# Patient Record
Sex: Male | Born: 1975
Health system: Southern US, Community
[De-identification: ages and names within clinical notes are randomized; demographics above are authoritative.]

## PROBLEM LIST (undated history)

## (undated) DIAGNOSIS — H9191 Unspecified hearing loss, right ear: Secondary | ICD-10-CM

## (undated) DIAGNOSIS — G4733 Obstructive sleep apnea (adult) (pediatric): Secondary | ICD-10-CM

## (undated) DIAGNOSIS — Z9989 Dependence on other enabling machines and devices: Secondary | ICD-10-CM

## (undated) DIAGNOSIS — R519 Headache, unspecified: Secondary | ICD-10-CM

## (undated) DIAGNOSIS — R51 Headache: Secondary | ICD-10-CM

## (undated) DIAGNOSIS — Z973 Presence of spectacles and contact lenses: Secondary | ICD-10-CM

## (undated) DIAGNOSIS — S86012A Strain of left Achilles tendon, initial encounter: Secondary | ICD-10-CM

## (undated) DIAGNOSIS — J45909 Unspecified asthma, uncomplicated: Secondary | ICD-10-CM

## (undated) HISTORY — DX: Headache, unspecified: R51.9

## (undated) HISTORY — PX: CARDIOVASCULAR STRESS TEST: SHX262

## (undated) HISTORY — DX: Headache: R51

## (undated) HISTORY — PX: TONSILLECTOMY: SUR1361

---

## 2006-01-07 ENCOUNTER — Emergency Department (HOSPITAL_COMMUNITY): Admission: EM | Admit: 2006-01-07 | Discharge: 2006-01-08 | Payer: Self-pay | Admitting: Emergency Medicine

## 2009-11-22 ENCOUNTER — Encounter: Admission: RE | Admit: 2009-11-22 | Discharge: 2009-11-22 | Payer: Self-pay | Admitting: Internal Medicine

## 2011-04-22 ENCOUNTER — Emergency Department (HOSPITAL_COMMUNITY)
Admission: EM | Admit: 2011-04-22 | Discharge: 2011-04-22 | Disposition: A | Discharge status: Home / Self Care | Payer: BC Managed Care – PPO | Attending: Emergency Medicine | Admitting: Emergency Medicine

## 2011-04-22 ENCOUNTER — Emergency Department (HOSPITAL_COMMUNITY): Payer: BC Managed Care – PPO

## 2011-04-22 ENCOUNTER — Encounter: Payer: Self-pay | Admitting: *Deleted

## 2011-04-22 ENCOUNTER — Other Ambulatory Visit: Payer: Self-pay

## 2011-04-22 DIAGNOSIS — J45909 Unspecified asthma, uncomplicated: Secondary | ICD-10-CM | POA: Insufficient documentation

## 2011-04-22 DIAGNOSIS — R0602 Shortness of breath: Secondary | ICD-10-CM | POA: Insufficient documentation

## 2011-04-22 DIAGNOSIS — F419 Anxiety disorder, unspecified: Secondary | ICD-10-CM

## 2011-04-22 DIAGNOSIS — F411 Generalized anxiety disorder: Secondary | ICD-10-CM | POA: Insufficient documentation

## 2011-04-22 DIAGNOSIS — R079 Chest pain, unspecified: Secondary | ICD-10-CM | POA: Insufficient documentation

## 2011-04-22 DIAGNOSIS — R42 Dizziness and giddiness: Secondary | ICD-10-CM | POA: Insufficient documentation

## 2011-04-22 DIAGNOSIS — R002 Palpitations: Secondary | ICD-10-CM | POA: Insufficient documentation

## 2011-04-22 LAB — POCT I-STAT TROPONIN I

## 2011-04-22 LAB — POCT I-STAT, CHEM 8
BUN: 17 mg/dL (ref 6–23)
Chloride: 103 mEq/L (ref 96–112)
Hemoglobin: 14.3 g/dL (ref 13.0–17.0)
Sodium: 142 mEq/L (ref 135–145)

## 2011-04-22 MED ORDER — ASPIRIN 81 MG PO CHEW
324.0000 mg | CHEWABLE_TABLET | Freq: Once | ORAL | Status: AC
  Administered 2011-04-22: 324 mg via ORAL
  Filled 2011-04-22: qty 4

## 2011-04-22 MED ORDER — LORAZEPAM 1 MG PO TABS
1.0000 mg | ORAL_TABLET | Freq: Once | ORAL | Status: AC
  Administered 2011-04-22: 1 mg via ORAL
  Filled 2011-04-22: qty 1

## 2011-04-22 MED ORDER — LORAZEPAM 1 MG PO TABS: 1.0000 mg | ORAL_TABLET | Freq: Three times a day (TID) | ORAL | Status: AC | PRN

## 2011-04-22 NOTE — ED Notes (Signed)
Chest pain that started this am, central chest and then sharp pains to right side of chest. reprots feeling dizzy and lightheaded. ekg done at triage.

## 2011-04-22 NOTE — ED Provider Notes (Signed)
History     CSN: [id number]  Arrival date & time 04/22/11  1509   First MD Initiated Contact with Patient 04/22/11 1557      Chief Complaint  Patient presents with  . Chest Pain  . Shortness of Breath  . Dizziness    (Consider location/radiation/quality/duration/timing/severity/associated sxs/prior treatment) HPI  Chest pain that started this am, central chest and then sharp pains to right side of chest. reprots feeling dizzy and lightheaded. ekg done at triage.  Patient does have recent stressors from the fact that his grandfather died yesterday.  He states that his pain this become do not last greater than 60 seconds single way abruptly.  He also states he had an episode of palpitations early morning and maybe lasted a minute or 2 and then went away .  Has not returned since then.  Past Medical History  Diagnosis Date  . Asthma     History reviewed. No pertinent past surgical history.  History reviewed. No pertinent family history.  History  Substance Use Topics  . Smoking status: Former Games developer  . Smokeless tobacco: Not on file  . Alcohol Use: No      Review of Systems  All other systems reviewed and are negative.    Allergies  Review of patient's allergies indicates no known allergies.  Home Medications  No current outpatient prescriptions on file.  BP 124/81  Pulse 81  Temp(Src) 97.1 F (36.2 C) (Oral)  Resp 20  SpO2 97%  Physical Exam  Nursing note and vitals reviewed. Constitutional: He is oriented to person, place, and time. He appears well-developed and well-nourished. No distress.  HENT:  Head: Normocephalic and atraumatic.  Eyes: Pupils are equal, round, and reactive to light.  Neck: Normal range of motion.  Cardiovascular: Normal rate and intact distal pulses.          Date: 04/22/2011  Rate: 79  Rhythm: normal sinus rhythm  QRS Axis: normal  Intervals: normal  ST/T Wave abnormalities: normal  Conduction Disutrbances:none:   Old EKG  Reviewed: none available     Pulmonary/Chest: No respiratory distress. He has no wheezes. He has no rales.  Abdominal: Normal appearance. He exhibits no distension.  Musculoskeletal: Normal range of motion.  Neurological: He is alert and oriented to person, place, and time. No cranial nerve deficit.  Skin: Skin is warm and dry. No rash noted.  Psychiatric: He has a normal mood and affect. His behavior is normal.    ED Course  Procedures (including critical care time)  Labs Reviewed  POCT I-STAT, CHEM 8 - Abnormal; Notable for the following:    Glucose, Bld 103 (*)    All other components within normal limits  POCT I-STAT TROPONIN I  I-STAT TROPONIN I  I-STAT, CHEM 8   Dg Chest 2 View  04/22/2011  *RADIOLOGY REPORT*  Clinical Data: Chest pain and shortness of breath.  CHEST - 2 VIEW  Comparison: 01/07/2006.  Findings: Normal sized heart.  Clear lungs.  Mild diffuse peribronchial thickening, unchanged.  Thoracic spine degenerative changes.  IMPRESSION: Stable mild bronchitic changes.  Original Report Authenticated By: Darrol Angel, M.D.     1. Anxiety       MDM   Patient has low risk for ACS.  Plan at this time is to start on daily aspirin.  Prescribed a benzodiazepine for anxiety and have him arrange close followup with his PMD.  Chest x-ray indicates he may have some bronchitic/asthmatic changes.  This all may be  pulmonary related.       Nelia Shi, MD 04/22/11 1705

## 2011-04-24 ENCOUNTER — Ambulatory Visit (INDEPENDENT_AMBULATORY_CARE_PROVIDER_SITE_OTHER): Payer: BC Managed Care – PPO

## 2011-04-24 DIAGNOSIS — R0789 Other chest pain: Secondary | ICD-10-CM

## 2011-04-25 ENCOUNTER — Observation Stay (HOSPITAL_COMMUNITY)
Admission: EM | Admit: 2011-04-25 | Discharge: 2011-04-26 | Disposition: A | Discharge status: Home / Self Care | Payer: BC Managed Care – PPO | Attending: Emergency Medicine | Admitting: Emergency Medicine

## 2011-04-25 ENCOUNTER — Emergency Department (HOSPITAL_COMMUNITY): Payer: BC Managed Care – PPO

## 2011-04-25 ENCOUNTER — Encounter (HOSPITAL_COMMUNITY): Payer: Self-pay | Admitting: Emergency Medicine

## 2011-04-25 DIAGNOSIS — R079 Chest pain, unspecified: Principal | ICD-10-CM | POA: Insufficient documentation

## 2011-04-25 DIAGNOSIS — R0602 Shortness of breath: Secondary | ICD-10-CM | POA: Insufficient documentation

## 2011-04-25 DIAGNOSIS — R5381 Other malaise: Secondary | ICD-10-CM | POA: Insufficient documentation

## 2011-04-25 DIAGNOSIS — R5383 Other fatigue: Secondary | ICD-10-CM | POA: Insufficient documentation

## 2011-04-25 DIAGNOSIS — I2510 Atherosclerotic heart disease of native coronary artery without angina pectoris: Secondary | ICD-10-CM

## 2011-04-25 DIAGNOSIS — R42 Dizziness and giddiness: Secondary | ICD-10-CM | POA: Insufficient documentation

## 2011-04-25 DIAGNOSIS — R0789 Other chest pain: Secondary | ICD-10-CM

## 2011-04-25 LAB — DIFFERENTIAL
Basophils Absolute: 0 10*3/uL (ref 0.0–0.1)
Eosinophils Relative: 3 % (ref 0–5)
Lymphs Abs: 2.4 10*3/uL (ref 0.7–4.0)
Monocytes Absolute: 0.6 10*3/uL (ref 0.1–1.0)
Neutro Abs: 4.5 10*3/uL (ref 1.7–7.7)
Neutrophils Relative %: 58 % (ref 43–77)

## 2011-04-25 LAB — CBC
Hemoglobin: 14.1 g/dL (ref 13.0–17.0)
RBC: 4.63 MIL/uL (ref 4.22–5.81)

## 2011-04-25 LAB — POCT I-STAT, CHEM 8
BUN: 16 mg/dL (ref 6–23)
Calcium, Ion: 1.31 mmol/L (ref 1.12–1.32)
Creatinine, Ser: 1.2 mg/dL (ref 0.50–1.35)
Glucose, Bld: 89 mg/dL (ref 70–99)
Hemoglobin: 14.3 g/dL (ref 13.0–17.0)

## 2011-04-25 MED ORDER — ASPIRIN 81 MG PO CHEW
81.0000 mg | CHEWABLE_TABLET | Freq: Once | ORAL | Status: AC
  Administered 2011-04-25: 81 mg via ORAL
  Filled 2011-04-25: qty 1

## 2011-04-25 MED ORDER — SODIUM CHLORIDE 0.9 % IV SOLN
Freq: Once | INTRAVENOUS | Status: AC
  Administered 2011-04-25: 22:00:00 via INTRAVENOUS

## 2011-04-25 NOTE — ED Notes (Signed)
Pt c/o central diffuse CP, had some RUQ pain on Sunday, describes as intermittant pressure, also some sob & dizziness, mentions nasal congestion. (denies: radiation, swelling, fever, nvd, cough, congestion, cold sx, palpitaitons, fluttering, numbness, tingling, diaphoresis  Or other sx). Seen recently for the same, given ativan for anxiety, reports no relief, thought it might be his asthma, reports no relief with albuterol). Calm, NAD, resps e/u, LS CTA.  Mentions cold sx in November & lifting heavier than usual 2 weeks ago at work.

## 2011-04-25 NOTE — ED Notes (Signed)
Pt st's he was seen here on Sun for chest pain and was dx with anxiety.  St's has continued to have the pain.  Was seen at a Urgent Care last pm and was referred to a Cardiologist.  Had neg exam last pm.  Pt st's pain started again today after work.

## 2011-04-25 NOTE — ED Provider Notes (Signed)
History     CSN: [id number]  Arrival date & time 04/25/11  1814   First MD Initiated Contact with Patient 04/25/11 2150      Chief Complaint  Patient presents with  . Chest Pain    (Consider location/radiation/quality/duration/timing/severity/associated sxs/prior treatment) HPI Comments: Patient describes central chest discomfort, pressure, like something is sitting on his chest intermittently for the past 6 days.  He's been seen in the emergency room and urgent care for these episodes at one point he was diagnosed with anxiety at the second evaluation.  He is referred to cardiology history returned to the emergency room with increased frequency of episodes that he described as coming every 5 minutes, and lasting 2 minutes while awake.  Please do not disturb his sleep.  Denies any increased stressors does state he has a history of asthma and uses an inhaler when he is wheezing.  Denies any risk factors for DVT or PE.  No reported family history of early cardiac disease.  Does not use any herbal products.  Does not smoke.  Has not noticed a change in weight or diet  Patient is a 36 y.o. male presenting with chest pain. The history is provided by the patient.  Chest Pain The chest pain began 1 - 2 weeks ago. Duration of episode(s) is 2 minutes. Chest pain occurs frequently. The chest pain is worsening. At its most intense, the pain is at 5/10. The pain is currently at 0/10. The severity of the pain is moderate. The quality of the pain is described as pressure-like. The pain does not radiate. Primary symptoms include shortness of breath and dizziness. Pertinent negatives for primary symptoms include no fever, no cough, no palpitations and no nausea.  Dizziness also occurs with weakness. Dizziness does not occur with nausea or diaphoresis.   Associated symptoms include weakness.  Pertinent negatives for associated symptoms include no diaphoresis and no numbness. Risk factors include no known risk  factors.     Past Medical History  Diagnosis Date  . Asthma     History reviewed. No pertinent past surgical history.  History reviewed. No pertinent family history.  History  Substance Use Topics  . Smoking status: Former Games developer  . Smokeless tobacco: Not on file  . Alcohol Use: No      Review of Systems  Constitutional: Negative for fever, diaphoresis and activity change.  Respiratory: Positive for shortness of breath. Negative for cough and chest tightness.   Cardiovascular: Positive for chest pain. Negative for palpitations and leg swelling.  Gastrointestinal: Negative for nausea.  Skin: Negative.   Neurological: Positive for dizziness and weakness. Negative for numbness.    Allergies  Review of patient's allergies indicates no known allergies.  Home Medications   Current Outpatient Rx  Name Route Sig Dispense Refill  . LORAZEPAM 1 MG PO TABS Oral Take 1 tablet (1 mg total) by mouth 3 (three) times daily as needed for anxiety. 15 tablet 0    BP 130/83  Pulse 81  Temp(Src) 97.9 F (36.6 C) (Oral)  Resp 17  SpO2 98%  Physical Exam  Constitutional: He is oriented to person, place, and time. He appears well-developed and well-nourished.  HENT:  Head: Normocephalic.  Eyes: Pupils are equal, round, and reactive to light.  Neck: Normal range of motion.  Cardiovascular: Normal rate, regular rhythm, normal heart sounds and intact distal pulses.   No murmur heard. Pulmonary/Chest: Effort normal and breath sounds normal. He has no wheezes. He exhibits no tenderness.  Abdominal: Bowel sounds are normal.  Musculoskeletal: Normal range of motion. He exhibits no edema.  Neurological: He is alert and oriented to person, place, and time.  Skin: Skin is warm and dry.    ED Course  Procedures (including critical care time)   Labs Reviewed  CBC  DIFFERENTIAL  I-STAT, CHEM 8  D-DIMER, QUANTITATIVE   No results found.   No diagnosis found.  Progression of  symptoms and increased frequency is concerning.  We'll obtain cardiac labs, CBC i-STAT, EKG d-dimer chest x-ray would administer, baby aspirin , as an IV be placed  MDM  Chest pain, concerning for angina        Arman Filter, NP 04/25/11 2212

## 2011-04-26 ENCOUNTER — Other Ambulatory Visit: Payer: Self-pay

## 2011-04-26 ENCOUNTER — Observation Stay (HOSPITAL_COMMUNITY): Payer: BC Managed Care – PPO

## 2011-04-26 LAB — CARDIAC PANEL(CRET KIN+CKTOT+MB+TROPI)
CK, MB: 2.3 ng/mL (ref 0.3–4.0)
Relative Index: 1.6 (ref 0.0–2.5)
Relative Index: 1.6 (ref 0.0–2.5)
Troponin I: <0.3 ng/mL (ref <0.30)
Troponin I: <0.3 ng/mL (ref <0.30)

## 2011-04-26 LAB — LIPID PANEL: LDL Cholesterol: 138 mg/dL — ABNORMAL HIGH (ref 0–99)

## 2011-04-26 MED ORDER — ASPIRIN 81 MG PO CHEW: 81.0000 mg | CHEWABLE_TABLET | Freq: Once | ORAL | Status: DC

## 2011-04-26 MED ORDER — METOPROLOL TARTRATE 25 MG PO TABS: 50.0000 mg | ORAL_TABLET | Freq: Once | ORAL | Status: DC

## 2011-04-26 MED ORDER — METOPROLOL TARTRATE 1 MG/ML IV SOLN
5.0000 mg | Freq: Once | INTRAVENOUS | Status: AC
  Administered 2011-04-26: 5 mg via INTRAVENOUS

## 2011-04-26 MED ORDER — NITROGLYCERIN 0.4 MG SL SUBL
SUBLINGUAL_TABLET | SUBLINGUAL | Status: AC
  Administered 2011-04-26: 0.4 mg via SUBLINGUAL
  Filled 2011-04-26: qty 25

## 2011-04-26 MED ORDER — METOPROLOL TARTRATE 1 MG/ML IV SOLN
INTRAVENOUS | Status: AC
  Filled 2011-04-26: qty 10

## 2011-04-26 MED ORDER — METOPROLOL TARTRATE 50 MG PO TABS: 25.0000 mg | ORAL_TABLET | Freq: Two times a day (BID) | ORAL | Status: DC

## 2011-04-26 MED ORDER — NITROGLYCERIN 0.4 MG SL SUBL
0.4000 mg | SUBLINGUAL_TABLET | Freq: Once | SUBLINGUAL | Status: AC
  Administered 2011-04-26: 0.4 mg via SUBLINGUAL

## 2011-04-26 MED ORDER — ATORVASTATIN CALCIUM 10 MG PO TABS: 20.0000 mg | ORAL_TABLET | Freq: Every day | ORAL | Status: DC

## 2011-04-26 MED ORDER — IOHEXOL 350 MG/ML SOLN
80.0000 mL | Freq: Once | INTRAVENOUS | Status: AC | PRN
  Administered 2011-04-26: 80 mL via INTRAVENOUS

## 2011-04-26 MED ORDER — METOPROLOL TARTRATE 25 MG PO TABS
100.0000 mg | ORAL_TABLET | Freq: Once | ORAL | Status: AC
  Administered 2011-04-26: 100 mg via ORAL
  Filled 2011-04-26: qty 4

## 2011-04-26 NOTE — ED Notes (Signed)
Heart rate 53-57 all night  While on the monitor.  When awakened for IV start and EKG heart rate up to 58-59  Metoprolol not given at this time

## 2011-04-26 NOTE — ED Notes (Signed)
Pt tolerated ct well. No conplaint of chest pain. Sinus rhythm on monitor

## 2011-04-26 NOTE — ED Provider Notes (Signed)
Patient remains in CDU under chest pain protocol - currently without pain at this time, VSS, awaiting coronary CT scan  Zachary Grant, [place] 04/26/11 2956  Obtained results of the Coronary CT scan and I have talked with Dr. Jacinto Halim, with whom the patient has an appointment with on Wednesday of this coming week - 05/02/2011 - He does not recommend moving the appointment forward at this time but does want [place] to start the patient on ASA 81mg  po daily, Metoprolol 25mg  BID and Lipitor 20mg  daily - he also recommends that we draw a lipid panel as well - the patient has eaten so we drew one here and will do another in the am when he has been fasting.  Zachary Grant [place], [place] 04/26/11 1247

## 2011-04-26 NOTE — ED Notes (Signed)
Have spoken with kelly in ct. Will start making preparation to get pt study completed

## 2011-04-26 NOTE — ED Notes (Signed)
PT GIVEN MEAL. AWAITING DISCHARGE INSTRUCTIONS

## 2011-04-26 NOTE — ED Provider Notes (Signed)
Medical screening examination/treatment/procedure(s) were performed by non-physician practitioner and as supervising physician I was immediately available for consultation/collaboration.  Flint Melter, MD 04/26/11 [phone]

## 2011-04-26 NOTE — ED Provider Notes (Signed)
Pt in CDU on CP protocol.  He is currently asymptomatic.  VS w/in nml limits, heart w/ RRR and lungs CTA.  Advised him to notify nursing staff if CP/dyspnea occurs over night.    Otilio Miu, [place] 04/26/11 [phone]

## 2011-04-26 NOTE — ED Notes (Signed)
SINUS RHYTHM OBSERVED ON MONITOR.

## 2011-04-26 NOTE — ED Provider Notes (Signed)
Medical screening examination/treatment/procedure(s) were performed by non-physician practitioner and as supervising physician I was immediately available for consultation/collaboration.  Lizzy Hamre R. Willmer Fellers, MD 04/26/11 1404 

## 2011-04-27 NOTE — Progress Notes (Signed)
Observation review is complete. 

## 2011-05-04 NOTE — ED Provider Notes (Signed)
Medical screening examination/treatment/procedure(s) were performed by non-physician practitioner and as supervising physician I was immediately available for consultation/collaboration.  Raeford Razor, MD 05/04/11 [phone]

## 2011-07-02 ENCOUNTER — Ambulatory Visit: Payer: BC Managed Care – PPO

## 2011-07-02 ENCOUNTER — Encounter: Payer: Self-pay | Admitting: Family Medicine

## 2011-07-02 ENCOUNTER — Ambulatory Visit (INDEPENDENT_AMBULATORY_CARE_PROVIDER_SITE_OTHER): Payer: BC Managed Care – PPO | Admitting: Family Medicine

## 2011-07-02 VITALS — BP 122/81 | HR 94 | Temp 98.0°F | Resp 16 | Ht 68.0 in | Wt 190.0 lb

## 2011-07-02 DIAGNOSIS — M545 Low back pain, unspecified: Secondary | ICD-10-CM

## 2011-07-02 DIAGNOSIS — M549 Dorsalgia, unspecified: Secondary | ICD-10-CM

## 2011-07-02 DIAGNOSIS — IMO0002 Reserved for concepts with insufficient information to code with codable children: Secondary | ICD-10-CM

## 2011-07-02 MED ORDER — CYCLOBENZAPRINE HCL 5 MG PO TABS: 5.0000 mg | ORAL_TABLET | Freq: Two times a day (BID) | ORAL | Status: AC | PRN

## 2011-07-02 MED ORDER — NAPROXEN 500 MG PO TABS: 500.0000 mg | ORAL_TABLET | Freq: Two times a day (BID) | ORAL | Status: DC

## 2011-07-02 NOTE — Progress Notes (Signed)
[facility]:  Office Visit  Chief Complaint:  Chief Complaint  Patient presents with  . Back Pain    low back pain on and off x 2 days    HPI: Zachary Grant is a 36 y.o. 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 He woke up with pain and it is intermittent, sharp/throbbing, right> left. Worse with certain ROM.  Denies numbness, weakness, tingling or loss of bowel/bladder dysfucntion. 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 The excessive lifting may have triggered this exacerabation. Tried OTc meds without relief.  Past Medical History  Diagnosis Date  . Asthma    No past surgical history on file. History   Social History  . Marital Status: Married    Spouse Name: N/A    Number of Children: N/A  . Years of Education: N/A   Social History Main Topics  . Smoking status: Former Smoker    Types: Cigarettes    Quit date: 06/04/2007  . Smokeless tobacco: None  . Alcohol Use: No  . Drug Use: No  . Sexually Active:    Other Topics Concern  . None   Social History Narrative  . None   Family History  Problem Relation Age of Onset  . Asthma Mother    No Known Allergies Prior to Admission medications   Medication Sig Start Date End Date Taking? Authorizing Provider  aspirin 81 MG chewable tablet Chew 1 tablet (81 mg total) by mouth once. 04/26/11 04/25/12 Yes Scarlette Calico C. Sanford, PA  carvedilol (COREG) 6.25 MG tablet Take 6.25 mg by mouth 2 (two) times daily with a meal.   Yes Historical Provider, MD  niacin (NIASPAN) 1000 MG CR tablet Take 1,000 mg by mouth at bedtime.   Yes Historical Provider, MD  atorvastatin (LIPITOR) 10 MG tablet Take 2 tablets (20 mg total) by mouth daily. 04/26/11 04/25/12  Izola Price. Sanford, PA  metoprolol (LOPRESSOR) 50 MG tablet Take 0.5 tablets (25 mg total) by mouth 2 (two) times daily. 04/26/11 04/25/12  Izola Price. Sanford, PA     ROS: The patient denies fevers,  chills, night sweats, unintentional weight loss, chest pain, palpitations, wheezing, dyspnea on exertion, nausea, vomiting, abdominal pain, dysuria, hematuria, melena, numbness, weakness, or tingling. + back pain  All other systems have been reviewed and were otherwise negative with the exception of those mentioned in the HPI and as above.    PHYSICAL EXAM: Filed Vitals:   07/02/11 1624  BP: 122/81  Pulse: 94  Temp: 98 F (36.7 C)  Resp: 16   Filed Vitals:   07/02/11 1624  Height: 5\' 8"  (1.727 m)  Weight: 190 lb (86.183 kg)   Body mass index is 28.89 kg/(m^2).  General: Alert, no acute distress HEENT:  Normocephalic, atraumatic, oropharynx patent.  Cardiovascular:  Regular rate and rhythm, no rubs murmurs or gallops.  No Carotid bruits, radial pulse intact. No pedal edema.  Respiratory: Clear to auscultation bilaterally.  No wheezes, rales, or rhonchi.  No cyanosis, no use of accessory musculature GI: No organomegaly, abdomen is soft and non-tender, positive bowel sounds.  No masses. Skin: No rashes. Neurologic: Facial musculature symmetric. Psychiatric: Patient is appropriate throughout our interaction. Lymphatic: No cervical lymphadenopathy Musculoskeletal: Gait intact.+ back pain, righ side paramsk tenderness. 5/5, 2/2 DTR, sensation nl.  Pain with flexion, with twisting, with lateral SB on right side.     LABS:  EKG/XRAY:   Primary read interpreted by Dr. Conley Rolls at [facility]. NO dislocation/fx.    ASSESSMENT/PLAN: Encounter Diagnosis  Name Primary?  . Back pain Yes   Back pain secondary to strain/sprain.  Sxs treatment with Naproxena nd flexeril and job modification Work note given for work restriction, no lifting, pushig, pulling > 5 lbs. From 3/18-3/25. May resume duties if improved after 3/25. 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    Hamilton Capri PHUONG, DO 07/02/2011 5:16 PM

## 2011-07-24 ENCOUNTER — Ambulatory Visit (INDEPENDENT_AMBULATORY_CARE_PROVIDER_SITE_OTHER): Payer: BC Managed Care – PPO | Admitting: Family Medicine

## 2011-07-24 VITALS — BP 136/87 | HR 76 | Temp 98.6°F | Resp 16 | Ht 68.0 in | Wt 191.0 lb

## 2011-07-24 DIAGNOSIS — IMO0002 Reserved for concepts with insufficient information to code with codable children: Secondary | ICD-10-CM

## 2011-07-24 DIAGNOSIS — M545 Low back pain: Secondary | ICD-10-CM

## 2011-07-24 MED ORDER — BUPIVACAINE HCL 0.5 % IJ SOLN: 5.0000 mL | Freq: Once | INTRAMUSCULAR | Status: DC

## 2011-07-24 MED ORDER — TRIAMCINOLONE ACETONIDE 40 MG/ML IJ SUSP: 40.0000 mg | Freq: Once | INTRAMUSCULAR | Status: DC

## 2011-07-24 NOTE — Progress Notes (Signed)
  Subjective:    Patient ID: Zachary Grant, male    DOB: [birth year], 36 y.o.   MRN: [id number]  HPI Chronic right sided LBP.  Was evaluated her on 3.18.13.  Naprosyn had helped. Flexeril has not helped. His pain worsened over the last 3 days after playing basketball 4 days ago for the 1st time in about 4 years for about 2-3 hours.  Now the pain is radiating down the  Right posterior thigh for the first time.  No paresthesias.  More of a dull sharp pain.  The pain is exacerbated with standing.  No fevers. No unintentional weight loss.  No night pain.  No pain with coughing or sneezing.  No injury while playing basketball a few days ago.   Review of Systems     Objective:   Physical Exam  Constitutional: He is oriented to person, place, and time. Vital signs are normal. He appears well-developed and well-nourished. He is active.  Cardiovascular:       No edema.  Pulmonary/Chest: Effort normal. No accessory muscle usage. No respiratory distress.  Musculoskeletal: He exhibits tenderness.       Lumbar back: He exhibits decreased range of motion, tenderness and pain. He exhibits no swelling and no deformity.       Back:  Neurological: He is alert and oriented to person, place, and time. He has normal reflexes. He displays normal reflexes. He exhibits normal muscle tone.  Skin: Skin is warm, dry and intact.          Assessment & Plan:  Acute on chronic LBP with left PSIS trigger point, mild lumbar DDD facet joint DJD  Trigger point steroid injection  Home exercise program  Naprosyn as needed  RTC prn

## 2011-07-24 NOTE — Patient Instructions (Signed)
Low Back Sprain with Rehab  A sprain is an injury in which a ligament is torn. The ligaments of the lower back are vulnerable to sprains. However, they are strong and require great force to be injured. These ligaments are important for stabilizing the spinal column. Sprains are classified into three categories. Grade 1 sprains cause pain, but the tendon is not lengthened. Grade 2 sprains include a lengthened ligament, due to the ligament being stretched or partially ruptured. With grade 2 sprains there is still function, although the function may be decreased. Grade 3 sprains involve a complete tear of the tendon or muscle, and function is usually impaired. SYMPTOMS   Severe pain in the lower back.   Sometimes, a feeling of a "pop," "snap," or tear, at the time of injury.   Tenderness and sometimes swelling at the injury site.   Uncommonly, bruising (contusion) within 48 hours of injury.   Muscle spasms in the back.  CAUSES  Low back sprains occur when a force is placed on the ligaments that is greater than they can handle. Common causes of injury include:  Performing a stressful act while off-balance.   Repetitive stressful activities that involve movement of the lower back.   Direct hit (trauma) to the lower back.  RISK INCREASES WITH:  Contact sports (football, wrestling).   Collisions (major skiing accidents).   Sports that require throwing or lifting (baseball, weightlifting).   Sports involving twisting of the spine (gymnastics, diving, tennis, golf).   Poor strength and flexibility.   Inadequate protection.   Previous back injury or surgery (especially fusion).  PREVENTION  Wear properly fitted and padded protective equipment.   Warm up and stretch properly before activity.   Allow for adequate recovery between workouts.   Maintain physical fitness:   Strength, flexibility, and endurance.   Cardiovascular fitness.   Maintain a healthy body weight.  PROGNOSIS   If treated properly, low back sprains usually heal with non-surgical treatment. The length of time for healing depends on the severity of the injury.  RELATED COMPLICATIONS   Recurring symptoms, resulting in a chronic problem.   Chronic inflammation and pain in the low back.   Delayed healing or resolution of symptoms, especially if activity is resumed too soon.   Prolonged impairment.   Unstable or arthritic joints of the low back.  TREATMENT  Treatment first involves the use of ice and medicine, to reduce pain and inflammation. The use of strengthening and stretching exercises may help reduce pain with activity. These exercises may be performed at home or with a therapist. Severe injuries may require referral to a therapist for further evaluation and treatment, such as ultrasound. Your caregiver may advise that you wear a back brace or corset, to help reduce pain and discomfort. Often, prolonged bed rest results in greater harm then benefit. Corticosteroid injections may be recommended. However, these should be reserved for the most serious cases. It is important to avoid using your back when lifting objects. At night, sleep on your back on a firm mattress, with a pillow placed under your knees. If non-surgical treatment is unsuccessful, surgery may be needed.  MEDICATION   If pain medicine is needed, nonsteroidal anti-inflammatory medicines (aspirin and ibuprofen), or other minor pain relievers (acetaminophen), are often advised.   Do not take pain medicine for 7 days before surgery.   Prescription pain relievers may be given, if your caregiver thinks they are needed. Use only as directed and only as much as you   need.   Ointments applied to the skin may be helpful.   Corticosteroid injections may be given by your caregiver. These injections should be reserved for the most serious cases, because they may only be given a certain number of times.  HEAT AND COLD  Cold treatment (icing)  should be applied for 10 to 15 minutes every 2 to 3 hours for inflammation and pain, and immediately after activity that aggravates your symptoms. Use ice packs or an ice massage.   Heat treatment may be used before performing stretching and strengthening activities prescribed by your caregiver, physical therapist, or athletic trainer. Use a heat pack or a warm water soak.  SEEK MEDICAL CARE IF:   Symptoms get worse or do not improve in 2 to 4 weeks, despite treatment.   You develop numbness or weakness in either leg.   You lose bowel or bladder function.   Any of the following occur after surgery: fever, increased pain, swelling, redness, drainage of fluids, or bleeding in the affected area.   New, unexplained symptoms develop. (Drugs used in treatment may produce side effects.)  EXERCISES  RANGE OF MOTION (ROM) AND STRETCHING EXERCISES - Low Back Sprain Most people with lower back pain will find that their symptoms get worse with excessive bending forward (flexion) or arching at the lower back (extension). The exercises that will help resolve your symptoms will focus on the opposite motion.  Your physician, physical therapist or athletic trainer will help you determine which exercises will be most helpful to resolve your lower back pain. Do not complete any exercises without first consulting with your caregiver. Discontinue any exercises which make your symptoms worse, until you speak to your caregiver. If you have pain, numbness or tingling which travels down into your buttocks, leg or foot, the goal of the therapy is for these symptoms to move closer to your back and eventually resolve. Sometimes, these leg symptoms will get better, but your lower back pain may worsen. This is often an indication of progress in your rehabilitation. Be very alert to any changes in your symptoms and the activities in which you participated in the 24 hours prior to the change. Sharing this information with your  caregiver will allow him or her to most efficiently treat your condition. These exercises may help you when beginning to rehabilitate your injury. Your symptoms may resolve with or without further involvement from your physician, physical therapist or athletic trainer. While completing these exercises, remember:   Restoring tissue flexibility helps normal motion to return to the joints. This allows healthier, less painful movement and activity.   An effective stretch should be held for at least 30 seconds.   A stretch should never be painful. You should only feel a gentle lengthening or release in the stretched tissue.  FLEXION RANGE OF MOTION AND STRETCHING EXERCISES: STRETCH - Flexion, Single Knee to Chest   Lie on a firm bed or floor with both legs extended in front of you.   Keeping one leg in contact with the floor, bring your opposite knee to your chest. Hold your leg in place by either grabbing behind your thigh or at your knee.   Pull until you feel a gentle stretch in your low back. Hold __________ seconds.   Slowly release your grasp and repeat the exercise with the opposite side.  Repeat __________ times. Complete this exercise __________ times per day.  STRETCH - Flexion, Double Knee to Chest  Lie on a firm bed or   floor with both legs extended in front of you.   Keeping one leg in contact with the floor, bring your opposite knee to your chest.   Tense your stomach muscles to support your back and then lift your other knee to your chest. Hold your legs in place by either grabbing behind your thighs or at your knees.   Pull both knees toward your chest until you feel a gentle stretch in your low back. Hold __________ seconds.   Tense your stomach muscles and slowly return one leg at a time to the floor.  Repeat __________ times. Complete this exercise __________ times per day.  STRETCH - Low Trunk Rotation  Lie on a firm bed or floor. Keeping your legs in front of you, bend  your knees so they are both pointed toward the ceiling and your feet are flat on the floor.   Extend your arms out to the side. This will stabilize your upper body by keeping your shoulders in contact with the floor.   Gently and slowly drop both knees together to one side until you feel a gentle stretch in your low back. Hold for __________ seconds.   Tense your stomach muscles to support your lower back as you bring your knees back to the starting position. Repeat the exercise to the other side.  Repeat __________ times. Complete this exercise __________ times per day  EXTENSION RANGE OF MOTION AND FLEXIBILITY EXERCISES: STRETCH - Extension, Prone on Elbows   Lie on your stomach on the floor, a bed will be too soft. Place your palms about shoulder width apart and at the height of your head.   Place your elbows under your shoulders. If this is too painful, stack pillows under your chest.   Allow your body to relax so that your hips drop lower and make contact more completely with the floor.   Hold this position for __________ seconds.   Slowly return to lying flat on the floor.  Repeat __________ times. Complete this exercise __________ times per day.  RANGE OF MOTION - Extension, Prone Press Ups  Lie on your stomach on the floor, a bed will be too soft. Place your palms about shoulder width apart and at the height of your head.   Keeping your back as relaxed as possible, slowly straighten your elbows while keeping your hips on the floor. You may adjust the placement of your hands to maximize your comfort. As you gain motion, your hands will come more underneath your shoulders.   Hold this position __________ seconds.   Slowly return to lying flat on the floor.  Repeat __________ times. Complete this exercise __________ times per day.  RANGE OF MOTION- Quadruped, Neutral Spine   Assume a hands and knees position on a firm surface. Keep your hands under your shoulders and your knees  under your hips. You may place padding under your knees for comfort.   Drop your head and point your tailbone toward the ground below you. This will round out your lower back like an angry cat. Hold this position for __________ seconds.   Slowly lift your head and release your tail bone so that your back sags into a large arch, like an old horse.   Hold this position for __________ seconds.   Repeat this until you feel limber in your low back.   Now, find your "sweet spot." This will be the most comfortable position somewhere between the two previous positions. This is your neutral spine.   Once you have found this position, tense your stomach muscles to support your low back.   Hold this position for __________ seconds.  Repeat __________ times. Complete this exercise __________ times per day.  STRENGTHENING EXERCISES - Low Back Sprain These exercises may help you when beginning to rehabilitate your injury. These exercises should be done near your "sweet spot." This is the neutral, low-back arch, somewhere between fully rounded and fully arched, that is your least painful position. When performed in this safe range of motion, these exercises can be used for people who have either a flexion or extension based injury. These exercises may resolve your symptoms with or without further involvement from your physician, physical therapist or athletic trainer. While completing these exercises, remember:   Muscles can gain both the endurance and the strength needed for everyday activities through controlled exercises.   Complete these exercises as instructed by your physician, physical therapist or athletic trainer. Increase the resistance and repetitions only as guided.   You may experience muscle soreness or fatigue, but the pain or discomfort you are trying to eliminate should never worsen during these exercises. If this pain does worsen, stop and make certain you are following the directions exactly.  If the pain is still present after adjustments, discontinue the exercise until you can discuss the trouble with your caregiver.  STRENGTHENING - Deep Abdominals, Pelvic Tilt   Lie on a firm bed or floor. Keeping your legs in front of you, bend your knees so they are both pointed toward the ceiling and your feet are flat on the floor.   Tense your lower abdominal muscles to press your low back into the floor. This motion will rotate your pelvis so that your tail bone is scooping upwards rather than pointing at your feet or into the floor.  With a gentle tension and even breathing, hold this position for __________ seconds. Repeat __________ times. Complete this exercise __________ times per day.  STRENGTHENING - Abdominals, Crunches   Lie on a firm bed or floor. Keeping your legs in front of you, bend your knees so they are both pointed toward the ceiling and your feet are flat on the floor. Cross your arms over your chest.   Slightly tip your chin down without bending your neck.   Tense your abdominals and slowly lift your trunk high enough to just clear your shoulder blades. Lifting higher can put excessive stress on the lower back and does not further strengthen your abdominal muscles.   Control your return to the starting position.  Repeat __________ times. Complete this exercise __________ times per day.  STRENGTHENING - Quadruped, Opposite UE/LE Lift   Assume a hands and knees position on a firm surface. Keep your hands under your shoulders and your knees under your hips. You may place padding under your knees for comfort.   Find your neutral spine and gently tense your abdominal muscles so that you can maintain this position. Your shoulders and hips should form a rectangle that is parallel with the floor and is not twisted.   Keeping your trunk steady, lift your right hand no higher than your shoulder and then your left leg no higher than your hip. Make sure you are not holding your  breath. Hold this position for __________ seconds.   Continuing to keep your abdominal muscles tense and your back steady, slowly return to your starting position. Repeat with the opposite arm and leg.  Repeat __________ times. Complete this exercise __________ times   per day.  STRENGTHENING - Abdominals and Quadriceps, Straight Leg Raise   Lie on a firm bed or floor with both legs extended in front of you.   Keeping one leg in contact with the floor, bend the other knee so that your foot can rest flat on the floor.   Find your neutral spine, and tense your abdominal muscles to maintain your spinal position throughout the exercise.   Slowly lift your straight leg off the floor about 6 inches for a count of 15, making sure to not hold your breath.   Still keeping your neutral spine, slowly lower your leg all the way to the floor.  Repeat this exercise with each leg __________ times. Complete this exercise __________ times per day. POSTURE AND BODY MECHANICS CONSIDERATIONS - Low Back Sprain Keeping correct posture when sitting, standing or completing your activities will reduce the stress put on different body tissues, allowing injured tissues a chance to heal and limiting painful experiences. The following are general guidelines for improved posture. Your physician or physical therapist will provide you with any instructions specific to your needs. While reading these guidelines, remember:  The exercises prescribed by your provider will help you have the flexibility and strength to maintain correct postures.   The correct posture provides the best environment for your joints to work. All of your joints have less wear and tear when properly supported by a spine with good posture. This means you will experience a healthier, less painful body.   Correct posture must be practiced with all of your activities, especially prolonged sitting and standing. Correct posture is as important when doing  repetitive low-stress activities (typing) as it is when doing a single heavy-load activity (lifting).  RESTING POSITIONS Consider which positions are most painful for you when choosing a resting position. If you have pain with flexion-based activities (sitting, bending, stooping, squatting), choose a position that allows you to rest in a less flexed posture. You would want to avoid curling into a fetal position on your side. If your pain worsens with extension-based activities (prolonged standing, working overhead), avoid resting in an extended position such as sleeping on your stomach. Most people will find more comfort when they rest with their spine in a more neutral position, neither too rounded nor too arched. Lying on a non-sagging bed on your side with a pillow between your knees, or on your back with a pillow under your knees will often provide some relief. Keep in mind, being in any one position for a prolonged period of time, no matter how correct your posture, can still lead to stiffness. PROPER SITTING POSTURE In order to minimize stress and discomfort on your spine, you must sit with correct posture. Sitting with good posture should be effortless for a healthy body. Returning to good posture is a gradual process. Many people can work toward this most comfortably by using various supports until they have the flexibility and strength to maintain this posture on their own. When sitting with proper posture, your ears will fall over your shoulders and your shoulders will fall over your hips. You should use the back of the chair to support your upper back. Your lower back will be in a neutral position, just slightly arched. You may place a small pillow or folded towel at the base of your lower back for  support.  When working at a desk, create an environment that supports good, upright posture. Without extra support, muscles tire, which leads to excessive   strain on joints and other tissues. Keep these  recommendations in mind: CHAIR:  A chair should be able to slide under your desk when your back makes contact with the back of the chair. This allows you to work closely.   The chair's height should allow your eyes to be level with the upper part of your monitor and your hands to be slightly lower than your elbows.  BODY POSITION  Your feet should make contact with the floor. If this is not possible, use a foot rest.   Keep your ears over your shoulders. This will reduce stress on your neck and low back.  INCORRECT SITTING POSTURES  If you are feeling tired and unable to assume a healthy sitting posture, do not slouch or slump. This puts excessive strain on your back tissues, causing more damage and pain. Healthier options include:  Using more support, like a lumbar pillow.   Switching tasks to something that requires you to be upright or walking.   Talking a brief walk.   Lying down to rest in a neutral-spine position.  PROLONGED STANDING WHILE SLIGHTLY LEANING FORWARD  When completing a task that requires you to lean forward while standing in one place for a long time, place either foot up on a stationary 2-4 inch high object to help maintain the best posture. When both feet are on the ground, the lower back tends to lose its slight inward curve. If this curve flattens (or becomes too large), then the back and your other joints will experience too much stress, tire more quickly, and can cause pain. CORRECT STANDING POSTURES Proper standing posture should be assumed with all daily activities, even if they only take a few moments, like when brushing your teeth. As in sitting, your ears should fall over your shoulders and your shoulders should fall over your hips. You should keep a slight tension in your abdominal muscles to brace your spine. Your tailbone should point down to the ground, not behind your body, resulting in an over-extended swayback posture.  INCORRECT STANDING POSTURES    Common incorrect standing postures include a forward head, locked knees and/or an excessive swayback. WALKING Walk with an upright posture. Your ears, shoulders and hips should all line-up. PROLONGED ACTIVITY IN A FLEXED POSITION When completing a task that requires you to bend forward at your waist or lean over a low surface, try to find a way to stabilize 3 out of 4 of your limbs. You can place a hand or elbow on your thigh or rest a knee on the surface you are reaching across. This will provide you more stability, so that your muscles do not tire as quickly. By keeping your knees relaxed, or slightly bent, you will also reduce stress across your lower back. CORRECT LIFTING TECHNIQUES DO :  Assume a wide stance. This will provide you more stability and the opportunity to get as close as possible to the object which you are lifting.   Tense your abdominals to brace your spine. Bend at the knees and hips. Keeping your back locked in a neutral-spine position, lift using your leg muscles. Lift with your legs, keeping your back straight.   Test the weight of unknown objects before attempting to lift them.   Try to keep your elbows locked down at your sides in order get the best strength from your shoulders when carrying an object.   Always ask for help when lifting heavy or awkward objects.  INCORRECT LIFTING TECHNIQUES DO   NOT:   Lock your knees when lifting, even if it is a small object.   Bend and twist. Pivot at your feet or move your feet when needing to change directions.   Assume that you can safely pick up even a paperclip without proper posture.  Document Released: 04/02/2005 Document Revised: 03/22/2011 Document Reviewed: 07/15/2008 [employer] Patient Information 2012 [employer] 

## 2012-04-16 HISTORY — PX: ACHILLES TENDON REPAIR: SUR1153

## 2012-04-25 ENCOUNTER — Emergency Department (HOSPITAL_COMMUNITY): Payer: BC Managed Care – PPO

## 2012-04-25 ENCOUNTER — Other Ambulatory Visit: Payer: Self-pay

## 2012-04-25 ENCOUNTER — Encounter (HOSPITAL_COMMUNITY): Payer: Self-pay | Admitting: *Deleted

## 2012-04-25 ENCOUNTER — Emergency Department (HOSPITAL_COMMUNITY)
Admission: EM | Admit: 2012-04-25 | Discharge: 2012-04-25 | Disposition: A | Discharge status: Home / Self Care | Payer: BC Managed Care – PPO | Attending: Emergency Medicine | Admitting: Emergency Medicine

## 2012-04-25 DIAGNOSIS — J111 Influenza due to unidentified influenza virus with other respiratory manifestations: Secondary | ICD-10-CM | POA: Insufficient documentation

## 2012-04-25 DIAGNOSIS — Z87891 Personal history of nicotine dependence: Secondary | ICD-10-CM | POA: Insufficient documentation

## 2012-04-25 DIAGNOSIS — I2510 Atherosclerotic heart disease of native coronary artery without angina pectoris: Secondary | ICD-10-CM | POA: Insufficient documentation

## 2012-04-25 DIAGNOSIS — R52 Pain, unspecified: Secondary | ICD-10-CM | POA: Insufficient documentation

## 2012-04-25 DIAGNOSIS — I10 Essential (primary) hypertension: Secondary | ICD-10-CM | POA: Insufficient documentation

## 2012-04-25 DIAGNOSIS — R05 Cough: Secondary | ICD-10-CM | POA: Insufficient documentation

## 2012-04-25 DIAGNOSIS — R059 Cough, unspecified: Secondary | ICD-10-CM | POA: Insufficient documentation

## 2012-04-25 DIAGNOSIS — J029 Acute pharyngitis, unspecified: Secondary | ICD-10-CM | POA: Insufficient documentation

## 2012-04-25 DIAGNOSIS — Z79899 Other long term (current) drug therapy: Secondary | ICD-10-CM | POA: Insufficient documentation

## 2012-04-25 DIAGNOSIS — J45909 Unspecified asthma, uncomplicated: Secondary | ICD-10-CM | POA: Insufficient documentation

## 2012-04-25 LAB — URINALYSIS, ROUTINE W REFLEX MICROSCOPIC
Bilirubin Urine: NEGATIVE
Ketones, ur: NEGATIVE mg/dL
Nitrite: NEGATIVE
Protein, ur: NEGATIVE mg/dL
pH: 5.5 (ref 5.0–8.0)

## 2012-04-25 LAB — CBC WITH DIFFERENTIAL/PLATELET
Basophils Absolute: 0 10*3/uL (ref 0.0–0.1)
Basophils Relative: 0 % (ref 0–1)
Eosinophils Absolute: 0.1 10*3/uL (ref 0.0–0.7)
Eosinophils Relative: 1 % (ref 0–5)
MCH: 29.7 pg (ref 26.0–34.0)
MCV: 86.9 fL (ref 78.0–100.0)
Platelets: 162 10*3/uL (ref 150–400)
RDW: 13.2 % (ref 11.5–15.5)

## 2012-04-25 LAB — COMPREHENSIVE METABOLIC PANEL
ALT: 42 U/L (ref 0–53)
AST: 28 U/L (ref 0–37)
Calcium: 9.7 mg/dL (ref 8.4–10.5)
GFR calc Af Amer: >90 mL/min (ref >90)
Sodium: 139 mEq/L (ref 135–145)
Total Protein: 7.6 g/dL (ref 6.0–8.3)

## 2012-04-25 LAB — D-DIMER, QUANTITATIVE: D-Dimer, Quant: 0.46 ug/mL-FEU (ref 0.00–0.48)

## 2012-04-25 LAB — TROPONIN I
Troponin I: <0.3 ng/mL (ref <0.30)
Troponin I: <0.3 ng/mL (ref <0.30)

## 2012-04-25 LAB — INFLUENZA PANEL BY PCR (TYPE A & B)
H1N1 flu by pcr: DETECTED — AB
Influenza A By PCR: POSITIVE — AB

## 2012-04-25 MED ORDER — ONDANSETRON HCL 4 MG/2ML IJ SOLN
4.0000 mg | Freq: Once | INTRAMUSCULAR | Status: AC
  Administered 2012-04-25: 4 mg via INTRAVENOUS
  Filled 2012-04-25: qty 2

## 2012-04-25 MED ORDER — KETOROLAC TROMETHAMINE 30 MG/ML IJ SOLN
30.0000 mg | Freq: Once | INTRAMUSCULAR | Status: AC
  Administered 2012-04-25: 30 mg via INTRAVENOUS
  Filled 2012-04-25: qty 1

## 2012-04-25 MED ORDER — SODIUM CHLORIDE 0.9 % IV BOLUS (SEPSIS)
1000.0000 mL | Freq: Once | INTRAVENOUS | Status: AC
  Administered 2012-04-25: 1000 mL via INTRAVENOUS

## 2012-04-25 MED ORDER — OSELTAMIVIR PHOSPHATE 75 MG PO CAPS: 75.0000 mg | ORAL_CAPSULE | Freq: Two times a day (BID) | ORAL | Status: DC

## 2012-04-25 MED ORDER — MORPHINE SULFATE 4 MG/ML IJ SOLN
4.0000 mg | Freq: Once | INTRAMUSCULAR | Status: AC
  Administered 2012-04-25: 4 mg via INTRAVENOUS
  Filled 2012-04-25: qty 1

## 2012-04-25 NOTE — ED Provider Notes (Signed)
History  This chart was scribed for Glynn Octave, MD by Bennett Scrape, ED Scribe. This patient was seen in room A05C/A05C and the patient's care was started at 10:42 AM.  CSN: [id number]  Arrival date & time 04/25/12  1001   First MD Initiated Contact with Patient 04/25/12 1042      Chief Complaint  Patient presents with  . Chest Pain  . Cough  . Sore Throat  . Generalized Body Aches    The history is provided by the patient. No language interpreter was used.    Zachary Grant is a 37 y.o. male who presents to the Emergency Department complaining of 16 hours of gradual onset, gradually worsening, constant upper right-sided CP described as an ache with associated 2 to 3 days of myalgias, sore throat, congestion and non-productive cough. He reports that he feels the pain currently and states that it is worse with deep breathing and touch. He has a h/o asthma and states that he has been using inhaler and mucinex. He reports that his wife was sick with similar symptoms last week. He states that he has one similar episode one year ago for which he was admitted but states that the pain felt more like a pressure sensation than an ache. He states that he was told that he has CAD with a blockage. He was seen by his cardiologist in August 2013 (5 months ago) and had negative stress test. He was told to continue his medications without changes. He denies having any other episodes until now. He denies fevers, trouble swallowing, SOB, diaphoresis and abdominal pain as associated symptoms. He denies getting an influenza vaccine this year. He also has a h/o HTN and is a former smoker but denies alcohol use.   PCP is Dr. Clarene Duke, Cardiologist is Dr. Jacinto Halim   Past Medical History  Diagnosis Date  . Asthma   . Coronary artery disease   . Hypertension     Past Surgical History  Procedure Date  . Tonsillectomy     Family History  Problem Relation Age of Onset  . Asthma Mother     History    Substance Use Topics  . Smoking status: Former Smoker    Types: Cigarettes    Quit date: 06/04/2007  . Smokeless tobacco: Not on file  . Alcohol Use: No      Review of Systems  A complete 10 system review of systems was obtained and all systems are negative except as noted in the HPI and PMH.   Allergies  Review of patient's allergies indicates no known allergies.  Home Medications   Current Outpatient Rx  Name  Route  Sig  Dispense  Refill  . ATORVASTATIN CALCIUM 10 MG PO TABS   Oral   Take 2 tablets (20 mg total) by mouth daily.   30 tablet   0   . CARVEDILOL 6.25 MG PO TABS   Oral   Take 6.25 mg by mouth 2 (two) times daily with a meal.           Triage Vitals: BP 127/75  Pulse 111  Temp 99.7 F (37.6 C) (Oral)  Resp 20  Ht 5\' 8"  (1.727 m)  Wt 185 lb (83.915 kg)  BMI 28.13 kg/m2  SpO2 96%  Physical Exam  Nursing note and vitals reviewed. Constitutional: He is oriented to person, place, and time. He appears well-developed and well-nourished. No distress.  HENT:  Head: Normocephalic and atraumatic.  Mouth/Throat: Oropharynx is clear and moist.  Eyes: Conjunctivae normal and EOM are normal. Pupils are equal, round, and reactive to light.  Neck: Neck supple. No tracheal deviation present.       No meningismus   Cardiovascular: Normal rate and regular rhythm.   Pulmonary/Chest: Effort normal and breath sounds normal. No respiratory distress. He exhibits tenderness (right -sided chest wall).  Abdominal: Soft. Bowel sounds are normal. There is no tenderness.  Musculoskeletal: Normal range of motion.  Neurological: He is alert and oriented to person, place, and time.  Skin: Skin is warm and dry.  Psychiatric: He has a normal mood and affect. His behavior is normal.    ED Course  Procedures (including critical care time)  DIAGNOSTIC STUDIES: Oxygen Saturation is 96% on room air, adequate by my interpretation.    COORDINATION OF CARE: 11:04 AM-  Discussed treatment plan which includes CXR, CBC panel, UA with pt at bedside and pt agreed to plan.   11:15 AM- Ordered 1,000 mL of bolus and 30 mg Toradol injection  11:59 AM- Consult complete with Dr. Jacinto Halim. Patient case explained and discussed. Dr. Jacinto Halim recommends screening for influenze. Will fax over a recent stress test. Call ended at 12:00 PM.  1:10 PM- Pt rechecked and reports that CP pressure is still present intermittently. Informed pt of my consult with Dr. Jacinto Halim and stated that we both feel that this CP is not cardiac related but related to viral symptoms that he has been having. Will order more pain medications.  1:15 PM- Ordered 4 mg Zofran injection and 4 mg morphine injection  2:32 PM-Pt rechecked and feels improved with medications listed above. Readdressed Dr. Jacinto Halim consult and discussed discharge plan with pt and pt is agreeable is this plan. Wants a tamiflu prescription.  Labs Reviewed  CBC WITH DIFFERENTIAL - Abnormal; Notable for the following:    WBC 10.9 (*)     Neutrophils Relative 82 (*)     Neutro Abs 9.0 (*)     Lymphocytes Relative 9 (*)     All other components within normal limits  COMPREHENSIVE METABOLIC PANEL - Abnormal; Notable for the following:    GFR calc non Af Amer 82 (*)     All other components within normal limits  INFLUENZA PANEL BY PCR - Abnormal; Notable for the following:    Influenza A By PCR POSITIVE (*)     H1N1 flu by pcr DETECTED (*)     All other components within normal limits  TROPONIN I  URINALYSIS, ROUTINE W REFLEX MICROSCOPIC  POCT I-STAT TROPONIN I  D-DIMER, QUANTITATIVE  TROPONIN I   Dg Chest 2 View  04/25/2012  *RADIOLOGY REPORT*  Clinical Data: Chest pain  CHEST - 2 VIEW  Comparison: 04/25/2011  Findings: Normal heart size.  Clear lungs.  No pneumothorax.  No pleural effusion.  Stable thoracic spine.  IMPRESSION: No active cardiopulmonary disease.   Original Report Authenticated By: Jolaine Click, M.D.      No diagnosis  found.    MDM  3 days of cough, aches, congestion chest pain is 4 PM yesterday it is constant and worse with deep breathing. History of CAD without intervention.   CT coronaries 1/13 IMPRESSION:  1. Significant, markedly age advanced but nonobstructive coronary  artery disease. Numerous areas of calcified and noncalcified  plaque. Likely the most significant area is a noncalcified plaquea  at the origin of the LAD with significant positive remodeling.  Such lesions have shown increased risk of acute myocardial  infarction.  2. No  significant extracardiac findings.  3. Left-sided coronary artery dominance.  4. Mild misregistration artifact involving the mid to distal LAD  and mid right coronary artery (a nondominant vessel).    Discussed patient presentation with Dr. Jacinto Halim. He knows patient well. He agrees patient's symptoms are likely secondary to influenza-like illness and not ACS. Nuclear stress test report received from February 2013 which showed no ischemic changes with adequate heart rate. Dr. Jacinto Halim agrees with unchanged EKG and negative troponin does not need further cardiac workup.  Delta troponin negative. Flu swab positive. Discuss Tamiflu with patient he is willing to try. Understands risks of GI side effects.   Date: 04/25/2012  Rate: 109  Rhythm: sinus tachycardia  QRS Axis: normal  Intervals: normal  ST/T Wave abnormalities: normal  Conduction Disutrbances:right bundle branch block  Narrative Interpretation:   Old EKG Reviewed: unchanged     I personally performed the services described in this documentation, which was scribed in my presence. The recorded information has been reviewed and is accurate.   Glynn Octave, MD 04/25/12 [phone]

## 2012-04-25 NOTE — ED Notes (Signed)
Discharge and follow up instructions reviewed. Pt verbalized understanding.  

## 2012-04-25 NOTE — ED Notes (Signed)
Pt c/o chest pain in central chest describes it as a tightness in nature. Rates pain 7/10.

## 2012-04-25 NOTE — ED Notes (Addendum)
Patient states he has had cough/body aches and not feeling well for a few days with sore throat and sinus congestion and cough as well.  Patient states he is concerned due to onset of chest pain/indigestion last night at 0400.  Patient states his sx stopped after taking pepto  And his inhaler.  Patient states he has a heart condition.  He has CAD with blockage.    Patient states he continues to have chest pain with deep breathing,  Last albuterol use was 15 min

## 2013-05-04 ENCOUNTER — Ambulatory Visit (HOSPITAL_BASED_OUTPATIENT_CLINIC_OR_DEPARTMENT_OTHER): Payer: BC Managed Care – PPO | Attending: Otolaryngology | Admitting: Radiology

## 2013-05-04 VITALS — Ht 68.0 in | Wt 195.0 lb

## 2013-05-04 DIAGNOSIS — R0989 Other specified symptoms and signs involving the circulatory and respiratory systems: Secondary | ICD-10-CM | POA: Insufficient documentation

## 2013-05-04 DIAGNOSIS — Z6829 Body mass index (BMI) 29.0-29.9, adult: Secondary | ICD-10-CM | POA: Insufficient documentation

## 2013-05-04 DIAGNOSIS — G4733 Obstructive sleep apnea (adult) (pediatric): Secondary | ICD-10-CM

## 2013-05-04 DIAGNOSIS — R0609 Other forms of dyspnea: Secondary | ICD-10-CM | POA: Insufficient documentation

## 2013-05-09 DIAGNOSIS — G4733 Obstructive sleep apnea (adult) (pediatric): Secondary | ICD-10-CM

## 2013-05-09 NOTE — Sleep Study (Signed)
   NAME: Zachary RunnerRicky Grant DATE OF BIRTH:  1975/09/20 MEDICAL RECORD NUMBER [id number]  LOCATION: [facility]  PHYSICIAN: YOUNG,CLINTON D  DATE OF STUDY: 05/04/2013  SLEEP STUDY TYPE: Nocturnal Polysomnogram               REFERRING PHYSICIAN: Serena Colonelosen, Jefry, MD  INDICATION FOR STUDY: Hypersomnia with sleep apnea  EPWORTH SLEEPINESS SCORE:   10/24 HEIGHT: 5\' 8"  (172.7 cm)  WEIGHT: 195 lb (88.451 kg)    Body mass index is 29.66 kg/(m^2).  NECK SIZE: 16.5 in.  MEDICATIONS: Charted for review  SLEEP ARCHITECTURE: Total sleep time 362 minutes with sleep efficiency 80%. Stage I was 10.1%, stage II 71.4%, stage III 0.7%, REM 17.8% of total sleep time. Sleep latency 21 minutes, REM latency 104 minutes, awake after sleep onset 31 minutes, arousal index 39.4. Bedtime medication: None  RESPIRATORY DATA: Apnea hypopnea index (AHI) 10.3 per hour. 62 events were scored including one obstructive apnea, 1 central apnea, 60 hypopneas. Events were not positional. REM AHI 2.8 per hour. The study was ordered as a diagnostic polysomnogram protocol without CPAP.  OXYGEN DATA: Moderately loud snoring with oxygen desaturation to a nadir of 87% and mean oxygen saturation through the study of 94.6% on room air.  CARDIAC DATA: Normal sinus rhythm  MOVEMENT/PARASOMNIA: No significant movement disturbance. No bathroom trips.  IMPRESSION/ RECOMMENDATION:   1) Mild obstructive sleep apnea/hypopnea syndrome, AHI 10.3 per hour with non-positional events. Moderately loud snoring with oxygen desaturation to a nadir of 87% and mean oxygen saturation through the study of 94.6% on room air.   Signed Jetty Duhamellinton Young M.D. Waymon BudgeYOUNG,CLINTON D Diplomate, American Board of Sleep Medicine  ELECTRONICALLY SIGNED ON:  05/09/2013, 11:02 AM [facility] PH: [phone]   FX: [phone] ACCREDITED BY THE AMERICAN ACADEMY OF SLEEP MEDICINE

## 2013-05-22 ENCOUNTER — Emergency Department (HOSPITAL_COMMUNITY)
Admission: EM | Admit: 2013-05-22 | Discharge: 2013-05-22 | Disposition: A | Discharge status: Home / Self Care | Payer: BC Managed Care – PPO | Attending: Emergency Medicine | Admitting: Emergency Medicine

## 2013-05-22 ENCOUNTER — Encounter (HOSPITAL_COMMUNITY): Payer: Self-pay | Admitting: Emergency Medicine

## 2013-05-22 DIAGNOSIS — Y990 Civilian activity done for income or pay: Secondary | ICD-10-CM | POA: Insufficient documentation

## 2013-05-22 DIAGNOSIS — Y9389 Activity, other specified: Secondary | ICD-10-CM | POA: Insufficient documentation

## 2013-05-22 DIAGNOSIS — I2510 Atherosclerotic heart disease of native coronary artery without angina pectoris: Secondary | ICD-10-CM | POA: Insufficient documentation

## 2013-05-22 DIAGNOSIS — Z79899 Other long term (current) drug therapy: Secondary | ICD-10-CM | POA: Insufficient documentation

## 2013-05-22 DIAGNOSIS — J45909 Unspecified asthma, uncomplicated: Secondary | ICD-10-CM | POA: Insufficient documentation

## 2013-05-22 DIAGNOSIS — Z23 Encounter for immunization: Secondary | ICD-10-CM | POA: Insufficient documentation

## 2013-05-22 DIAGNOSIS — S61512A Laceration without foreign body of left wrist, initial encounter: Secondary | ICD-10-CM

## 2013-05-22 DIAGNOSIS — Z7982 Long term (current) use of aspirin: Secondary | ICD-10-CM | POA: Insufficient documentation

## 2013-05-22 DIAGNOSIS — W268XXA Contact with other sharp object(s), not elsewhere classified, initial encounter: Secondary | ICD-10-CM | POA: Insufficient documentation

## 2013-05-22 DIAGNOSIS — S61509A Unspecified open wound of unspecified wrist, initial encounter: Secondary | ICD-10-CM | POA: Insufficient documentation

## 2013-05-22 DIAGNOSIS — I10 Essential (primary) hypertension: Secondary | ICD-10-CM | POA: Insufficient documentation

## 2013-05-22 DIAGNOSIS — Y9289 Other specified places as the place of occurrence of the external cause: Secondary | ICD-10-CM | POA: Insufficient documentation

## 2013-05-22 MED ORDER — TETANUS-DIPHTH-ACELL PERTUSSIS 5-2.5-18.5 LF-MCG/0.5 IM SUSP
0.5000 mL | Freq: Once | INTRAMUSCULAR | Status: AC
  Administered 2013-05-22: 0.5 mL via INTRAMUSCULAR
  Filled 2013-05-22: qty 0.5

## 2013-05-22 NOTE — ED Provider Notes (Signed)
CSN: [id number]     Arrival date & time 05/22/13  1925 History  This chart was scribed for Junius FinnerErin O'Malley, non-physician practitioner, working with Toy BakerAnthony T Allen, MD by Bennett Scrapehristina Taylor, ED Scribe. This patient was seen in room WTR9/WTR9 and the patient's care was started at 8:28 PM.    Chief Complaint  Patient presents with  . Laceration    The history is provided by the patient. No language interpreter was used.    HPI Comments: Zachary RunnerRicky Grant is a 38 y.o. male who is right-handed presents to the Emergency Department complaining of a laceration that occurred approximately 7 hours ago. Pt was at work when he accidentally cut his left wrist on a piece of steel. The bleeding was controlled with gauze and tape at work. Pt then cleaned the wound with peroxide and reapplied a bandage to the area when he noticed it started to re-bleed causing him concern. The bleeding is controlled currently. He denies any numbness and any decreased ROM for the fingers. TD vaccine is NOT UTD.   Pt works in a [employer], [employer]  No current PCP. [facility]   Past Medical History  Diagnosis Date  . Asthma   . Coronary artery disease   . Hypertension    Past Surgical History  Procedure Laterality Date  . Tonsillectomy    achilles tendon repair per pt at bedside Family History  Problem Relation Age of Onset  . Asthma Mother    History  Substance Use Topics  . Smoking status: Former Smoker    Types: Cigarettes    Quit date: 06/04/2007  . Smokeless tobacco: Not on file  . Alcohol Use: No    Review of Systems  Skin: Positive for wound.  Neurological: Negative for numbness.  All other systems reviewed and are negative.    Allergies  Review of patient's allergies indicates no known allergies.  Home Medications   Current Outpatient Rx  Name  Route  Sig  Dispense  Refill  . albuterol (PROVENTIL HFA;VENTOLIN HFA) 108 (90 BASE) MCG/ACT inhaler   Inhalation   Inhale 1-2  puffs into the lungs every 6 (six) hours as needed for wheezing or shortness of breath.         Marland Kitchen. aspirin 81 MG tablet   Oral   Take 81 mg by mouth every evening.          Marland Kitchen. atorvastatin (LIPITOR) 20 MG tablet   Oral   Take 20 mg by mouth daily.         Marland Kitchen. ibuprofen (ADVIL,MOTRIN) 200 MG tablet   Oral   Take 400-600 mg by mouth every 6 (six) hours as needed. For pain         . Multiple Vitamins-Minerals (ALIVE MENS ENERGY PO)   Oral   Take 2 tablets by mouth every evening.          . niacin (NIASPAN) 500 MG CR tablet   Oral   Take 500 mg by mouth at bedtime.          Triage Vitals: BP 138/81  Pulse 95  Temp(Src) 98 F (36.7 C) (Oral)  Resp 16  SpO2 96%  Physical Exam  Nursing note and vitals reviewed. Constitutional: He is oriented to person, place, and time. He appears well-developed and well-nourished.  HENT:  Head: Normocephalic and atraumatic.  Eyes: EOM are normal.  Neck: Normal range of motion.  Cardiovascular: Normal rate.   Pulmonary/Chest: Effort normal.  Musculoskeletal: Normal range of motion.  1 by 2 cm triangular shaped laceration to the volar aspect of the left wrist. No active bleeding. FROM. 5/5 grip strength. Normal sensation. Radial pulse is 2+. Capillary refill <2 seconds  Neurological: He is alert and oriented to person, place, and time.  Skin: Skin is warm and dry.  Psychiatric: He has a normal mood and affect. His behavior is normal.    ED Course  Procedures (including critical care time)  DIAGNOSTIC STUDIES: Oxygen Saturation is 96% on RA, adequate by my interpretation.    COORDINATION OF CARE: 8:30 PM-Discussed treatment plan which includes TD vaccine update, wound care and steri-strip application with pt at bedside and pt agreed to plan. Encouraged to keep the wound covered for the next 2 days to allow it to heal properly.   8:37 PM- The wound is cleansed, debrided of foreign material as much as possible, and dressed. The  patient is alerted to watch for any signs of infection (redness, pus, pain, increased swelling or fever) and call if such occurs. Home wound care instructions are provided. Tetanus vaccination status reviewed: tetanus given today.  Wound was thoroughly cleaned with normal saline and guaze. One steri strip was applied.   Labs Review Labs Reviewed - No data to display Imaging Review No results found.  EKG Interpretation   None       MDM   1. Laceration of left wrist    Advised to remove bandage in 2 days. May clear with soap and water. Return precautions provided. Pt verbalized understanding and agreement with tx plan.  I personally performed the services described in this documentation, which was scribed in my presence. The recorded information has been reviewed and is accurate.    Junius Finner, PA-C 05/22/13 2207

## 2013-05-22 NOTE — ED Notes (Addendum)
Pt reports that about six hours ago while at work he accidentally cut his left wrist with a piece of steel. Pt reports bleeding was controlled, however it began to re-bleed. Pt is able to move all fingers. Bleeding is controlled at this time. Pt is unaware of last tetanus vaccination. Pt is A/O x4 and in NAD.

## 2013-05-23 NOTE — ED Provider Notes (Signed)
Medical screening examination/treatment/procedure(s) were performed by non-physician practitioner and as supervising physician I was immediately available for consultation/collaboration.  Kristel Durkee T Artrell Lawless, MD 05/23/13 2318 

## 2013-07-03 ENCOUNTER — Ambulatory Visit: Payer: BC Managed Care – PPO | Admitting: Family Medicine

## 2013-07-03 ENCOUNTER — Ambulatory Visit: Payer: BC Managed Care – PPO

## 2013-07-03 VITALS — BP 122/70 | HR 97 | Temp 97.8°F | Resp 18 | Ht 69.5 in | Wt 198.0 lb

## 2013-07-03 DIAGNOSIS — M76899 Other specified enthesopathies of unspecified lower limb, excluding foot: Secondary | ICD-10-CM

## 2013-07-03 DIAGNOSIS — M25559 Pain in unspecified hip: Secondary | ICD-10-CM

## 2013-07-03 DIAGNOSIS — M25551 Pain in right hip: Secondary | ICD-10-CM

## 2013-07-03 DIAGNOSIS — M7061 Trochanteric bursitis, right hip: Secondary | ICD-10-CM

## 2013-07-03 MED ORDER — METHYLPREDNISOLONE ACETATE 80 MG/ML IJ SUSP: 40.0000 mg | Freq: Once | INTRAMUSCULAR | Status: DC

## 2013-07-03 MED ORDER — TRAMADOL HCL 50 MG PO TABS: 50.0000 mg | ORAL_TABLET | Freq: Three times a day (TID) | ORAL | Status: DC | PRN

## 2013-07-03 NOTE — Progress Notes (Signed)
[facility] [address], [place] [address] [phone]  Date:  07/03/2013   Name:  Zachary RunnerRicky Grant   DOB:  1975/11/24   MRN:  [id number]  PCP:  Mickie HillierLITTLE,KEVIN LORNE, MD    Chief Complaint: Hip Pain   History of Present Illness:  Zachary Grant is a 38 y.o. very pleasant male patient who presents with the following:  He is here today with right hip pain.  He has noted this pain a couple of times a week for 4 or 5 months.  There was no known injury.  The pain is over his lateral right hip.  Early this am he noted onset of pain around 0400- this was more severe for about 30 minutes.  He had to get up out of bed due to the pain.  The pain may last all day long, but is not usually as severe as it was early this am.  The pain will shoot down the right leg sometimes as well.  No numbness or weakness that he has noticed. No incontinence.  He had back pain which was treated with a trigger point injection about 2 years ago per Dr. Althea CharonMcKinley. Right now his back is ok.    He works standing up for long periods.  This may contribute to his pain. He is taking mobic right now for other aches and pains  He has a history of asthma.  He was dx with plaque in his arteries- he was tx with lipitor but was told he could stop taking this per Dr. Jacinto HalimGanji.    There are no active problems to display for this patient.   Past Medical History  Diagnosis Date  . Asthma   . Coronary artery disease   . Hypertension     Past Surgical History  Procedure Laterality Date  . Tonsillectomy    . Ankle surgery      History  Substance Use Topics  . Smoking status: Former Smoker    Types: Cigarettes    Quit date: 06/04/2007  . Smokeless tobacco: Not on file  . Alcohol Use: No    Family History  Problem Relation Age of Onset  . Asthma Mother     No Known Allergies  Medication list has been reviewed and updated.  Current Outpatient Prescriptions on File Prior to Visit  Medication Sig  Dispense Refill  . albuterol (PROVENTIL HFA;VENTOLIN HFA) 108 (90 BASE) MCG/ACT inhaler Inhale 1-2 puffs into the lungs every 6 (six) hours as needed for wheezing or shortness of breath.      Marland Kitchen. ibuprofen (ADVIL,MOTRIN) 200 MG tablet Take 400-600 mg by mouth every 6 (six) hours as needed. For pain      . Multiple Vitamins-Minerals (ALIVE MENS ENERGY PO) Take 2 tablets by mouth every evening.       Marland Kitchen. aspirin 81 MG tablet Take 81 mg by mouth every evening.       Marland Kitchen. atorvastatin (LIPITOR) 20 MG tablet Take 20 mg by mouth daily.      . niacin (NIASPAN) 500 MG CR tablet Take 500 mg by mouth at bedtime.       Current Facility-Administered Medications on File Prior to Visit  Medication Dose Route Frequency Provider Last Rate Last Dose  . bupivacaine (MARCAINE) 0.5 % (with pres) injection 5 mL  5 mL Infiltration Once Otho Darnerominic W McKinley, MD      . triamcinolone acetonide [facility](KENALOG-40) injection 40 mg  40 mg Intramuscular Once Dominic W McKinley,  MD        Review of Systems:  As per HPI- otherwise negative.   Physical Examination: Filed Vitals:   07/03/13 1021  BP: 122/70  Pulse: 97  Temp: 97.8 F (36.6 C)  Resp: 18   Filed Vitals:   07/03/13 1021  Height: 5' 9.5" (1.765 m)  Weight: 198 lb (89.812 kg)   Body mass index is 28.83 kg/(m^2). Ideal Body Weight: Weight in (lb) to have BMI = 25: 171.4  GEN: WDWN, NAD, Non-toxic, A & O x 3, looks well.  Mild overweight HEENT: Atraumatic, Normocephalic. Neck supple. No masses, No LAD. Ears and Nose: No external deformity. CV: RRR, No M/G/R. No JVD. No thrill. No extra heart sounds. PULM: CTA B, no wheezes, crackles, rhonchi. No retractions. No resp. distress. No accessory muscle use. EXTR: No c/c/e NEURO Normal gait.  PSYCH: Normally interactive. Conversant. Not depressed or anxious appearing.  Calm demeanor.  He is tender over the right greater trochanter.  Hip is non- tender to rotation and flexion.  Normal LE strength, sensation and DTR  bilaterally.  Negative SLR  [facility] reading (PRIMARY) by  Dr. Patsy Lager. right hip: negative.  ? Old avulsion or degenerative change. No acute fracture.    After injection he noted improvement in his pain right away.    VC obtained.   Skin prepped with betadine and alcohol.  Right greater tronchteric bursa injected with 4ml of 1% lidocaine and 40 mg of depo- medrol.  No blood loss.  Pt tolerated procedure well.   RIGHT HIP - COMPLETE 2+ VIEW  COMPARISON: None.  FINDINGS: Small bony density along the lateral aspect of the right acetabulum may reflect old injury or early spurring. No acute bony abnormality. No fracture, subluxation or dislocation. SI joints are symmetric and unremarkable.  IMPRESSION: No acute bony abnormality.  Assessment and Plan: Hip pain, right - Plan: DG Hip Complete Right, methylPREDNISolone acetate (DEPO-MEDROL) injection 40 mg, traMADol (ULTRAM) 50 MG tablet  Trochanteric bursitis of right hip  Treated with injection as above.  Counseled to avoid NSAID meds for the next 5- 7 days, he may use tylenol or tramadol as needed. See patient instructions for more details.     Signed Abbe Amsterdam, MD

## 2013-07-03 NOTE — Patient Instructions (Signed)
You had a steroid injection to the right greater trochanteric bursa today. Do not take mobic or other nsaid medications such as ibuprofen for about 5 days.  In the meantime you may use tylenol or the tramadol rx as needed.   Remember tramadol can make you feel sleepy.    Let me know if your hip is not better in the next couple of days

## 2013-07-09 ENCOUNTER — Other Ambulatory Visit: Payer: Self-pay | Admitting: Family Medicine

## 2013-07-09 ENCOUNTER — Encounter: Payer: Self-pay | Admitting: Family Medicine

## 2013-07-09 DIAGNOSIS — M25551 Pain in right hip: Secondary | ICD-10-CM

## 2013-07-10 ENCOUNTER — Other Ambulatory Visit: Payer: Self-pay | Admitting: Family Medicine

## 2013-07-10 MED ORDER — TRAMADOL HCL 50 MG PO TABS: 50.0000 mg | ORAL_TABLET | Freq: Three times a day (TID) | ORAL | Status: DC | PRN

## 2013-07-15 ENCOUNTER — Other Ambulatory Visit: Payer: Self-pay | Admitting: Family Medicine

## 2013-07-15 DIAGNOSIS — M25551 Pain in right hip: Secondary | ICD-10-CM

## 2013-08-06 ENCOUNTER — Encounter: Payer: Self-pay | Admitting: Family Medicine

## 2013-08-06 ENCOUNTER — Other Ambulatory Visit: Payer: Self-pay | Admitting: Family Medicine

## 2013-08-06 DIAGNOSIS — M25551 Pain in right hip: Secondary | ICD-10-CM

## 2013-08-07 ENCOUNTER — Other Ambulatory Visit: Payer: Self-pay | Admitting: Family Medicine

## 2013-08-07 MED ORDER — TRAMADOL HCL 50 MG PO TABS: 50.0000 mg | ORAL_TABLET | Freq: Three times a day (TID) | ORAL | Status: DC | PRN

## 2013-08-16 ENCOUNTER — Other Ambulatory Visit: Payer: Self-pay | Admitting: Family Medicine

## 2013-08-16 DIAGNOSIS — M25551 Pain in right hip: Secondary | ICD-10-CM

## 2013-08-17 ENCOUNTER — Other Ambulatory Visit: Payer: Self-pay | Admitting: Family Medicine

## 2013-08-17 ENCOUNTER — Encounter: Payer: Self-pay | Admitting: Family Medicine

## 2013-08-17 DIAGNOSIS — M25551 Pain in right hip: Secondary | ICD-10-CM

## 2013-08-17 MED ORDER — TRAMADOL HCL 50 MG PO TABS: 50.0000 mg | ORAL_TABLET | Freq: Three times a day (TID) | ORAL | Status: DC | PRN

## 2013-08-23 ENCOUNTER — Other Ambulatory Visit: Payer: Self-pay | Admitting: Family Medicine

## 2013-08-23 DIAGNOSIS — M25551 Pain in right hip: Secondary | ICD-10-CM

## 2013-08-26 ENCOUNTER — Encounter (HOSPITAL_COMMUNITY): Payer: Self-pay | Admitting: Emergency Medicine

## 2013-08-26 ENCOUNTER — Emergency Department (INDEPENDENT_AMBULATORY_CARE_PROVIDER_SITE_OTHER)
Admission: EM | Admit: 2013-08-26 | Discharge: 2013-08-26 | Disposition: A | Discharge status: Home / Self Care | Payer: BC Managed Care – PPO | Source: Home / Self Care | Attending: Emergency Medicine | Admitting: Emergency Medicine

## 2013-08-26 DIAGNOSIS — R197 Diarrhea, unspecified: Secondary | ICD-10-CM

## 2013-08-26 DIAGNOSIS — S335XXA Sprain of ligaments of lumbar spine, initial encounter: Secondary | ICD-10-CM

## 2013-08-26 DIAGNOSIS — S39012A Strain of muscle, fascia and tendon of lower back, initial encounter: Secondary | ICD-10-CM

## 2013-08-26 DIAGNOSIS — X58XXXA Exposure to other specified factors, initial encounter: Secondary | ICD-10-CM

## 2013-08-26 LAB — POCT URINALYSIS DIP (DEVICE)
BILIRUBIN URINE: NEGATIVE
Glucose, UA: NEGATIVE mg/dL
Hgb urine dipstick: NEGATIVE
KETONES UR: NEGATIVE mg/dL
LEUKOCYTES UA: NEGATIVE
Nitrite: NEGATIVE
PH: 6.5 (ref 5.0–8.0)
Protein, ur: NEGATIVE mg/dL
SPECIFIC GRAVITY, URINE: 1.02 (ref 1.005–1.030)
Urobilinogen, UA: 0.2 mg/dL (ref 0.0–1.0)

## 2013-08-26 MED ORDER — CYCLOBENZAPRINE HCL 5 MG PO TABS: 5.0000 mg | ORAL_TABLET | Freq: Three times a day (TID) | ORAL | Status: DC | PRN

## 2013-08-26 MED ORDER — IBUPROFEN 800 MG PO TABS: 800.0000 mg | ORAL_TABLET | Freq: Three times a day (TID) | ORAL | Status: DC

## 2013-08-26 NOTE — ED Provider Notes (Signed)
CSN: [id number]     Arrival date & time 08/26/13  1516 History   First MD Initiated Contact with Patient 08/26/13 1716     Chief Complaint  Patient presents with  . Back Pain  . Abdominal Pain   (Consider location/radiation/quality/duration/timing/severity/associated sxs/prior Treatment) HPI Comments: Back pain on and off for 2 years. Sometimes does back exercises given to him 2 years ago by other provider and they help him. Hasn't been doing lately. Pain worse in last 2 months, mostly right side low back, sometimes shoots down R thigh; no numbness or tingling. Also diarrhea since last night with cramping abd pain that improves after diarrhea episodes.   Patient is a 38 y.o. male presenting with back pain and diarrhea. The history is provided by the patient.  Back Pain Location:  Lumbar spine Quality:  Aching Radiates to:  R thigh Pain severity:  Moderate Pain is:  Same all the time Onset quality:  Gradual Duration: 2 years intermittently, worse in last 2 months. Timing:  Intermittent Progression:  Worsening Chronicity:  Recurrent Context: lifting heavy objects   Relieved by:  Nothing Worsened by:  Movement and palpation Ineffective treatments:  Ibuprofen Associated symptoms: abdominal pain   Associated symptoms: no bladder incontinence, no bowel incontinence, no fever, no numbness, no perianal numbness, no tingling and no weakness   Diarrhea Quality:  Watery Severity:  Moderate Onset quality:  Sudden Number of episodes:  5 Duration:  1 day Timing:  Sporadic Progression:  Improving Relieved by:  None tried Exacerbated by: fried foods. Ineffective treatments:  None tried Associated symptoms: abdominal pain   Associated symptoms: no chills, no fever and no vomiting   Risk factors: suspect food intake     Past Medical History  Diagnosis Date  . Asthma   . Coronary artery disease   . Hypertension    Past Surgical History  Procedure Laterality Date  . Tonsillectomy      . Ankle surgery     Family History  Problem Relation Age of Onset  . Asthma Mother    History  Substance Use Topics  . Smoking status: Former Smoker    Types: Cigarettes    Quit date: 06/04/2007  . Smokeless tobacco: Not on file  . Alcohol Use: No    Review of Systems  Constitutional: Negative for fever and chills.  Gastrointestinal: Positive for abdominal pain and diarrhea. Negative for nausea, vomiting, constipation, blood in stool and bowel incontinence.  Genitourinary: Negative for bladder incontinence.  Musculoskeletal: Positive for back pain.  Neurological: Negative for tingling, weakness and numbness.    Allergies  Review of patient's allergies indicates no known allergies.  Home Medications   Prior to Admission medications   Medication Sig Start Date End Date Taking? Authorizing Provider  ibuprofen (ADVIL,MOTRIN) 200 MG tablet Take 400-600 mg by mouth every 6 (six) hours as needed. For pain   Yes Historical Provider, MD  albuterol (PROVENTIL HFA;VENTOLIN HFA) 108 (90 BASE) MCG/ACT inhaler Inhale 1-2 puffs into the lungs every 6 (six) hours as needed for wheezing or shortness of breath.    Historical Provider, MD  cyclobenzaprine (FLEXERIL) 5 MG tablet Take 1 tablet (5 mg total) by mouth 3 (three) times daily as needed for muscle spasms. 08/26/13   Cathlyn ParsonsAngela M Kerri Asche, NP  ibuprofen (ADVIL,MOTRIN) 800 MG tablet Take 1 tablet (800 mg total) by mouth 3 (three) times daily. 08/26/13   Cathlyn ParsonsAngela M Coley Kulikowski, NP  Multiple Vitamins-Minerals (ALIVE MENS ENERGY PO) Take 2 tablets by mouth  every evening.     Historical Provider, MD  traMADol (ULTRAM) 50 MG tablet Take 1 tablet (50 mg total) by mouth every 8 (eight) hours as needed. 08/17/13   Gwenlyn FoundJessica C Copland, MD   BP 118/88  Pulse 82  Temp(Src) 98.7 F (37.1 C) (Oral)  Resp 18  SpO2 98% Physical Exam  Constitutional: He appears well-developed and well-nourished.  Cardiovascular: Normal rate and regular rhythm.   Pulmonary/Chest:  Effort normal and breath sounds normal.  Abdominal: Normal appearance. He exhibits no distension. Bowel sounds are increased. There is generalized tenderness. There is no rigidity, no rebound and no guarding.  Tenderness is mild  Musculoskeletal:       Lumbar back: He exhibits tenderness. He exhibits normal range of motion, no bony tenderness, no swelling and no deformity.       Back:  Pain with straight leg raise RLE.   Neurological: He has normal strength. Gait normal.  Heel walk and toe walk normal    ED Course  Procedures (including critical care time) Labs Review Labs Reviewed  POCT URINALYSIS DIP (DEVICE)    Imaging Review No results found.   MDM   1. Lumbar strain   2. Diarrhea   pt to f/u with pcp about long term back problems. Pt to resume doing back exercises. Rx ibuprofen 800mg  TID #21 and flexeril 5mg  TID prn #21. Pt given diet instructions for managing diarrhea. Most likely viral enteritis.      Cathlyn ParsonsAngela M Herschel Fleagle, NP 08/26/13 2137  Cathlyn ParsonsAngela M Tayja Manzer, NP 08/26/13 2138

## 2013-08-26 NOTE — ED Notes (Signed)
Pt c/o back pain onset 2013 and abd pain onset last night Back pain getting worse; denies inj/trauma; works in [employer] lifting heavy obj Pt increases w/long periods of standing Has had x2 loose stools day Denies f/v/d, urinary sx,  Alert w/no signs of acute distress.

## 2013-08-26 NOTE — Discharge Instructions (Signed)
Make sure you take the ibuprofen with food. Only take the cyclobenzaprine at night if you have to drive or work since it will likely make you sleepy. Do your back exercises every day. Follow up with [facility] as soon as you are able.    Back Pain, Adult Low back pain is very common. About 1 in 5 people have back pain.The cause of low back pain is rarely dangerous. The pain often gets better over time.About half of people with a sudden onset of back pain feel better in just 2 weeks. About 8 in 10 people feel better by 6 weeks.  CAUSES Some common causes of back pain include:  Strain of the muscles or ligaments supporting the spine.  Wear and tear (degeneration) of the spinal discs.  Arthritis.  Direct injury to the back. DIAGNOSIS Most of the time, the direct cause of low back pain is not known.However, back pain can be treated effectively even when the exact cause of the pain is unknown.Answering your caregiver's questions about your overall health and symptoms is one of the most accurate ways to make sure the cause of your pain is not dangerous. If your caregiver needs more information, he or she may order lab work or imaging tests (X-rays or MRIs).However, even if imaging tests show changes in your back, this usually does not require surgery. HOME CARE INSTRUCTIONS For many people, back pain returns.Since low back pain is rarely dangerous, it is often a condition that people can learn to [facility]manageon their own.   Remain active. It is stressful on the back to sit or stand in one place. Do not sit, drive, or stand in one place for more than 30 minutes at a time. Take short walks on level surfaces as soon as pain allows.Try to increase the length of time you walk each day.  Do not stay in bed.Resting more than 1 or 2 days can delay your recovery.  Do not avoid exercise or work.Your body is made to move.It is not dangerous to be active, even though your back may hurt.Your back will likely  heal faster if you return to being active before your pain is gone.  Pay attention to your body when you bend and lift. Many people have less discomfortwhen lifting if they bend their knees, keep the load close to their bodies,and avoid twisting. Often, the most comfortable positions are those that put less stress on your recovering back.  Find a comfortable position to sleep. Use a firm mattress and lie on your side with your knees slightly bent. If you lie on your back, put a pillow under your knees.  Only take over-the-counter or prescription medicines as directed by your caregiver. Over-the-counter medicines to reduce pain and inflammation are often the most helpful.Your caregiver may prescribe muscle relaxant drugs.These medicines help dull your pain so you can more quickly return to your normal activities and healthy exercise.  Put ice on the injured area.  Put ice in a plastic bag.  Place a towel between your skin and the bag.  Leave the ice on for 15-20 minutes, 03-04 times a day for the first 2 to 3 days. After that, ice and heat may be alternated to reduce pain and spasms.  Ask your caregiver about trying back exercises and gentle massage. This may be of some benefit.  Avoid feeling anxious or stressed.Stress increases muscle tension and can worsen back pain.It is important to recognize when you are anxious or stressed and learn ways  to manage it.Exercise is a great option. SEEK MEDICAL CARE IF:  You have pain that is not relieved with rest or medicine.  You have pain that does not improve in 1 week.  You have new symptoms.  You are generally not feeling well. SEEK IMMEDIATE MEDICAL CARE IF:   You have pain that radiates from your back into your legs.  You develop new bowel or bladder control problems.  You have unusual weakness or numbness in your arms or legs.  You develop nausea or vomiting.  You develop abdominal pain.  You feel faint. Document Released:  04/02/2005 Document Revised: 10/02/2011 Document Reviewed: 08/21/2010 [facility] Patient Information 2014 [place], [place].

## 2013-08-29 ENCOUNTER — Emergency Department (HOSPITAL_COMMUNITY): Payer: BC Managed Care – PPO

## 2013-08-29 ENCOUNTER — Encounter (HOSPITAL_COMMUNITY): Payer: Self-pay | Admitting: Emergency Medicine

## 2013-08-29 ENCOUNTER — Emergency Department (HOSPITAL_COMMUNITY)
Admission: EM | Admit: 2013-08-29 | Discharge: 2013-08-30 | Disposition: A | Discharge status: Home / Self Care | Payer: BC Managed Care – PPO | Attending: Emergency Medicine | Admitting: Emergency Medicine

## 2013-08-29 DIAGNOSIS — Y92838 Other recreation area as the place of occurrence of the external cause: Secondary | ICD-10-CM

## 2013-08-29 DIAGNOSIS — X500XXA Overexertion from strenuous movement or load, initial encounter: Secondary | ICD-10-CM | POA: Insufficient documentation

## 2013-08-29 DIAGNOSIS — Z87891 Personal history of nicotine dependence: Secondary | ICD-10-CM | POA: Insufficient documentation

## 2013-08-29 DIAGNOSIS — J45909 Unspecified asthma, uncomplicated: Secondary | ICD-10-CM | POA: Insufficient documentation

## 2013-08-29 DIAGNOSIS — I10 Essential (primary) hypertension: Secondary | ICD-10-CM | POA: Insufficient documentation

## 2013-08-29 DIAGNOSIS — Y9364 Activity, baseball: Secondary | ICD-10-CM | POA: Insufficient documentation

## 2013-08-29 DIAGNOSIS — Z79899 Other long term (current) drug therapy: Secondary | ICD-10-CM | POA: Insufficient documentation

## 2013-08-29 DIAGNOSIS — Y9239 Other specified sports and athletic area as the place of occurrence of the external cause: Secondary | ICD-10-CM | POA: Insufficient documentation

## 2013-08-29 DIAGNOSIS — I2510 Atherosclerotic heart disease of native coronary artery without angina pectoris: Secondary | ICD-10-CM | POA: Insufficient documentation

## 2013-08-29 DIAGNOSIS — Z9889 Other specified postprocedural states: Secondary | ICD-10-CM | POA: Insufficient documentation

## 2013-08-29 DIAGNOSIS — S86019A Strain of unspecified Achilles tendon, initial encounter: Secondary | ICD-10-CM

## 2013-08-29 DIAGNOSIS — S96819A Strain of other specified muscles and tendons at ankle and foot level, unspecified foot, initial encounter: Principal | ICD-10-CM

## 2013-08-29 DIAGNOSIS — S93499A Sprain of other ligament of unspecified ankle, initial encounter: Secondary | ICD-10-CM | POA: Insufficient documentation

## 2013-08-29 NOTE — ED Notes (Signed)
Pt c/o achillis tendon pain while playing softball. Pt was running, felt pop, instant sharp pain, and unable to bear weight on left foot.

## 2013-08-29 NOTE — ED Notes (Signed)
Pt playing softball tonight and was running bases, felt pop in back of left foot and was unable to walk.  Pt thinks he tore achilles tendon.  Pt had surgery in that foot 1 year ago and has a plate.

## 2013-08-29 NOTE — ED Provider Notes (Signed)
Medical screening examination/treatment/procedure(s) were performed by non-physician practitioner and as supervising physician I was immediately available for consultation/collaboration.  Leslee Homeavid Rukiya Hodgkins, M.D.  Reuben Likesavid C Khloe Hunkele, MD 08/29/13 [phone]

## 2013-08-29 NOTE — ED Notes (Signed)
Patient transported to X-ray 

## 2013-08-30 MED ORDER — HYDROCODONE-ACETAMINOPHEN 5-325 MG PO TABS: 2.0000 | ORAL_TABLET | ORAL | Status: DC | PRN

## 2013-08-30 MED ORDER — HYDROCODONE-ACETAMINOPHEN 5-325 MG PO TABS
2.0000 | ORAL_TABLET | Freq: Once | ORAL | Status: AC
  Administered 2013-08-30: 2 via ORAL
  Filled 2013-08-30: qty 2

## 2013-08-30 NOTE — ED Provider Notes (Signed)
CSN: [id number]     Arrival date & time 08/29/13  2215 History   First MD Initiated Contact with Patient 08/29/13 2233     Chief Complaint  Patient presents with  . Ankle Pain     (Consider location/radiation/quality/duration/timing/severity/associated sxs/prior Treatment) HPI Comments: Patient is a 38 year old male who presents with left ankle pain that started earlier this evening when he was playing softball. Patient reports hearing a "pop" sudden onset of sharp, severe pain that is localized to left ankle. Patient reports progressive worsening of pain. Ankle movement and weight bearing activity make the pain worse. Nothing makes the pain better. Patient reports associated swelling. Patient has not tried anything for pain relief. Patient denies obvious deformity, numbness/tingling, coolness/weakness of extremity, bruising, and any other injury.      Past Medical History  Diagnosis Date  . Asthma   . Coronary artery disease   . Hypertension    Past Surgical History  Procedure Laterality Date  . Tonsillectomy    . Ankle surgery     Family History  Problem Relation Age of Onset  . Asthma Mother    History  Substance Use Topics  . Smoking status: Former Smoker    Types: Cigarettes    Quit date: 06/04/2007  . Smokeless tobacco: Not on file  . Alcohol Use: No    Review of Systems  Constitutional: Negative for fever, chills and fatigue.  HENT: Negative for trouble swallowing.   Eyes: Negative for visual disturbance.  Respiratory: Negative for shortness of breath.   Cardiovascular: Negative for chest pain and palpitations.  Gastrointestinal: Negative for nausea, vomiting, abdominal pain and diarrhea.  Genitourinary: Negative for dysuria and difficulty urinating.  Musculoskeletal: Positive for arthralgias and joint swelling. Negative for neck pain.  Skin: Negative for color change.  Neurological: Negative for dizziness and weakness.  Psychiatric/Behavioral: Negative for  dysphoric mood.      Allergies  Review of patient's allergies indicates no known allergies.  Home Medications   Prior to Admission medications   Medication Sig Start Date End Date Taking? Authorizing Provider  albuterol (PROVENTIL HFA;VENTOLIN HFA) 108 (90 BASE) MCG/ACT inhaler Inhale 1-2 puffs into the lungs every 6 (six) hours as needed for wheezing or shortness of breath.    Historical Provider, MD  cyclobenzaprine (FLEXERIL) 5 MG tablet Take 1 tablet (5 mg total) by mouth 3 (three) times daily as needed for muscle spasms. 08/26/13   Cathlyn ParsonsAngela M Kabbe, NP  ibuprofen (ADVIL,MOTRIN) 200 MG tablet Take 400-600 mg by mouth every 6 (six) hours as needed. For pain    Historical Provider, MD  ibuprofen (ADVIL,MOTRIN) 800 MG tablet Take 1 tablet (800 mg total) by mouth 3 (three) times daily. 08/26/13   Cathlyn ParsonsAngela M Kabbe, NP  Multiple Vitamins-Minerals (ALIVE MENS ENERGY PO) Take 2 tablets by mouth every evening.     Historical Provider, MD  traMADol (ULTRAM) 50 MG tablet Take 1 tablet (50 mg total) by mouth every 8 (eight) hours as needed. 08/17/13   Gwenlyn FoundJessica C Copland, MD   BP 126/76  Pulse 115  Temp(Src) 98.5 F (36.9 C) (Oral)  Resp 16  SpO2 97% Physical Exam  Nursing note and vitals reviewed. Constitutional: He appears well-developed and well-nourished. No distress.  HENT:  Head: Normocephalic and atraumatic.  Eyes: Conjunctivae are normal.  Cardiovascular: Normal rate, regular rhythm and intact distal pulses.  Exam reveals no gallop and no friction rub.   No murmur heard. Pulmonary/Chest: Effort normal and breath sounds normal. He  has no wheezes. He has no rales. He exhibits no tenderness.  Abdominal: Soft. There is no tenderness.  Musculoskeletal:  Edema of posterior left ankle. ROM limited due to pain. No obvious deformity. Thompson test positive on the left.   Neurological: He is alert.  Left foot sensation intact. Speech is goal-oriented. Moves limbs without ataxia.   Skin: Skin is  warm and dry.  Psychiatric: He has a normal mood and affect. His behavior is normal.    ED Course  Procedures (including critical care time) Labs Review Labs Reviewed - No data to display  SPLINT APPLICATION Date/Time: 12:34 AM Authorized by: Emilia BeckKaitlyn Goldy Calandra Consent: Verbal consent obtained. Risks and benefits: risks, benefits and alternatives were discussed Consent given by: patient Splint applied by: orthopedic technician Location details: left ankle Splint type: posterior splint Post-procedure: The splinted body part was neurovascularly unchanged following the procedure. Patient tolerance: Patient tolerated the procedure well with no immediate complications.     Imaging Review Dg Ankle Complete Left  08/29/2013   CLINICAL DATA:  Medial ankle pain and bruising and swelling following trauma  EXAM: LEFT ANKLE COMPLETE - 3+ VIEW  COMPARISON:  None.  FINDINGS: The ankle joint mortise is preserved. The talar dome is intact. There is no evidence of an acute malleolar fracture. There is a metallic screw present over the posterior aspect of the calcaneus presumably from previous Achilles tendon repair. The soft tissues of the posterior aspect of the ankle demonstrate thickening which may reflect edema secondary to acute injury of the Achilles tendon and associated soft tissues.  IMPRESSION: There is no evidence of acute ankle fracture. One cannot exclude acute Achilles tendon injury.   Electronically Signed   By: David  [place]azil[place]   On: 08/29/2013 23:34     EKG Interpretation None      MDM   Final diagnoses:  Achilles tendon rupture    12:03 AM Xray shows no fracture. No neurovascular compromise. Patient likely has achilles tendon rupture. Patient will have Vicodin for pain and be non weight bearing.   12:34 AM Patient has a splint. He will be non weight bearing with recommended ortho follow up. Vicodin for pain.     Emilia BeckKaitlyn Jesicca Dipierro, PA-C 08/30/13 [id number]

## 2013-08-30 NOTE — ED Notes (Signed)
Ortho paged for posterior splint.

## 2013-08-30 NOTE — Discharge Instructions (Signed)
Take vicodin as needed for pain. Do not bear weight on your left foot. Follow up with the Orthopedic surgeon for further evaluation.

## 2013-09-02 ENCOUNTER — Encounter (HOSPITAL_BASED_OUTPATIENT_CLINIC_OR_DEPARTMENT_OTHER): Payer: Self-pay | Admitting: *Deleted

## 2013-09-02 NOTE — Progress Notes (Signed)
NPO AFTER MN. ARRIVE AT 0930. NEEDS HG. MAY TAKE PAIN MED IF NEEDED W/ SIPS OF WATER.

## 2013-09-03 ENCOUNTER — Ambulatory Visit: Payer: BC Managed Care – PPO

## 2013-09-03 ENCOUNTER — Encounter (HOSPITAL_BASED_OUTPATIENT_CLINIC_OR_DEPARTMENT_OTHER): Payer: Self-pay | Admitting: *Deleted

## 2013-09-03 ENCOUNTER — Other Ambulatory Visit: Payer: Self-pay | Admitting: Foot & Ankle Surgery

## 2013-09-03 NOTE — ED Provider Notes (Signed)
Medical screening examination/treatment/procedure(s) were performed by non-physician practitioner and as supervising physician I was immediately available for consultation/collaboration.   EKG Interpretation None        Brandt LoosenJulie Manly, MD 09/03/13 [phone]

## 2013-09-04 ENCOUNTER — Ambulatory Visit (HOSPITAL_BASED_OUTPATIENT_CLINIC_OR_DEPARTMENT_OTHER): Payer: BC Managed Care – PPO | Admitting: Anesthesiology

## 2013-09-04 ENCOUNTER — Encounter (HOSPITAL_BASED_OUTPATIENT_CLINIC_OR_DEPARTMENT_OTHER): Payer: BC Managed Care – PPO | Admitting: Anesthesiology

## 2013-09-04 ENCOUNTER — Encounter (HOSPITAL_BASED_OUTPATIENT_CLINIC_OR_DEPARTMENT_OTHER)
Admission: RE | Disposition: A | Discharge status: Home / Self Care | Payer: Self-pay | Source: Ambulatory Visit | Attending: Foot & Ankle Surgery

## 2013-09-04 ENCOUNTER — Encounter (HOSPITAL_BASED_OUTPATIENT_CLINIC_OR_DEPARTMENT_OTHER): Payer: Self-pay | Admitting: *Deleted

## 2013-09-04 ENCOUNTER — Ambulatory Visit (HOSPITAL_BASED_OUTPATIENT_CLINIC_OR_DEPARTMENT_OTHER)
Admission: RE | Admit: 2013-09-04 | Discharge: 2013-09-04 | Disposition: A | Discharge status: Home / Self Care | Payer: BC Managed Care – PPO | Source: Ambulatory Visit | Attending: Foot & Ankle Surgery | Admitting: Foot & Ankle Surgery

## 2013-09-04 ENCOUNTER — Other Ambulatory Visit: Payer: Self-pay | Admitting: Foot & Ankle Surgery

## 2013-09-04 DIAGNOSIS — G4730 Sleep apnea, unspecified: Secondary | ICD-10-CM | POA: Insufficient documentation

## 2013-09-04 DIAGNOSIS — S96819A Strain of other specified muscles and tendons at ankle and foot level, unspecified foot, initial encounter: Principal | ICD-10-CM

## 2013-09-04 DIAGNOSIS — X58XXXA Exposure to other specified factors, initial encounter: Secondary | ICD-10-CM | POA: Insufficient documentation

## 2013-09-04 DIAGNOSIS — J45909 Unspecified asthma, uncomplicated: Secondary | ICD-10-CM | POA: Insufficient documentation

## 2013-09-04 DIAGNOSIS — S93499A Sprain of other ligament of unspecified ankle, initial encounter: Secondary | ICD-10-CM | POA: Insufficient documentation

## 2013-09-04 DIAGNOSIS — S9000XA Contusion of unspecified ankle, initial encounter: Secondary | ICD-10-CM | POA: Insufficient documentation

## 2013-09-04 DIAGNOSIS — Z87891 Personal history of nicotine dependence: Secondary | ICD-10-CM | POA: Insufficient documentation

## 2013-09-04 HISTORY — DX: Strain of left Achilles tendon, initial encounter: S86.012A

## 2013-09-04 HISTORY — DX: Dependence on other enabling machines and devices: Z99.89

## 2013-09-04 HISTORY — DX: Unspecified asthma, uncomplicated: J45.909

## 2013-09-04 HISTORY — DX: Obstructive sleep apnea (adult) (pediatric): G47.33

## 2013-09-04 HISTORY — PX: ACHILLES TENDON SURGERY: SHX542

## 2013-09-04 HISTORY — DX: Presence of spectacles and contact lenses: Z97.3

## 2013-09-04 LAB — POCT HEMOGLOBIN-HEMACUE: Hemoglobin: 14.8 g/dL (ref 13.0–17.0)

## 2013-09-04 SURGERY — KIDNER PROCEDURE, MODIFIED
Anesthesia: General | Site: Ankle | Laterality: Left

## 2013-09-04 MED ORDER — CEFAZOLIN SODIUM-DEXTROSE 2-3 GM-% IV SOLR
2.0000 g | INTRAVENOUS | Status: AC
  Administered 2013-09-04: 2 g via INTRAVENOUS
  Filled 2013-09-04: qty 50

## 2013-09-04 MED ORDER — SUCCINYLCHOLINE CHLORIDE 20 MG/ML IJ SOLN
INTRAMUSCULAR | Status: DC | PRN
  Administered 2013-09-04: 100 mg via INTRAVENOUS

## 2013-09-04 MED ORDER — FENTANYL CITRATE 0.05 MG/ML IJ SOLN
INTRAMUSCULAR | Status: AC
  Filled 2013-09-04: qty 2

## 2013-09-04 MED ORDER — FENTANYL CITRATE 0.05 MG/ML IJ SOLN
100.0000 ug | Freq: Once | INTRAMUSCULAR | Status: AC
  Administered 2013-09-04: 100 ug via INTRAVENOUS
  Filled 2013-09-04: qty 2

## 2013-09-04 MED ORDER — FENTANYL CITRATE 0.05 MG/ML IJ SOLN
INTRAMUSCULAR | Status: DC | PRN
  Administered 2013-09-04: 50 ug via INTRAVENOUS
  Administered 2013-09-04: 100 ug via INTRAVENOUS

## 2013-09-04 MED ORDER — HYDROMORPHONE HCL PF 1 MG/ML IJ SOLN
0.2500 mg | INTRAMUSCULAR | Status: DC | PRN
Start: 2013-09-04 — End: 2013-09-06
  Filled 2013-09-04: qty 1

## 2013-09-04 MED ORDER — FENTANYL CITRATE 0.05 MG/ML IJ SOLN
INTRAMUSCULAR | Status: AC
  Filled 2013-09-04: qty 4

## 2013-09-04 MED ORDER — PROMETHAZINE HCL 25 MG/ML IJ SOLN
6.2500 mg | INTRAMUSCULAR | Status: DC | PRN
  Filled 2013-09-04: qty 1

## 2013-09-04 MED ORDER — SODIUM BICARBONATE 8.4 % IV SOLN
INTRAVENOUS | Status: DC | PRN
  Administered 2013-09-04: 30 mL via EPIDURAL

## 2013-09-04 MED ORDER — ONDANSETRON HCL 4 MG/2ML IJ SOLN
INTRAMUSCULAR | Status: DC | PRN
  Administered 2013-09-04: 4 mg via INTRAVENOUS

## 2013-09-04 MED ORDER — LACTATED RINGERS IV SOLN
INTRAVENOUS | Status: DC
  Administered 2013-09-04 (×2): via INTRAVENOUS
  Filled 2013-09-04: qty 1000

## 2013-09-04 MED ORDER — PROPOFOL 10 MG/ML IV BOLUS
INTRAVENOUS | Status: DC | PRN
  Administered 2013-09-04: 200 mg via INTRAVENOUS

## 2013-09-04 MED ORDER — LIDOCAINE HCL (CARDIAC) 20 MG/ML IV SOLN
INTRAVENOUS | Status: DC | PRN
  Administered 2013-09-04: 80 mg via INTRAVENOUS

## 2013-09-04 MED ORDER — MIDAZOLAM HCL 2 MG/2ML IJ SOLN
2.0000 mg | Freq: Once | INTRAMUSCULAR | Status: AC
  Administered 2013-09-04: 2 mg via INTRAVENOUS
  Filled 2013-09-04: qty 2

## 2013-09-04 MED ORDER — DEXAMETHASONE SODIUM PHOSPHATE 4 MG/ML IJ SOLN
INTRAMUSCULAR | Status: DC | PRN
  Administered 2013-09-04: 10 mg via INTRAVENOUS

## 2013-09-04 MED ORDER — MIDAZOLAM HCL 2 MG/2ML IJ SOLN
INTRAMUSCULAR | Status: AC
  Filled 2013-09-04: qty 2

## 2013-09-04 MED ORDER — BUPIVACAINE LIPOSOME 1.3 % IJ SUSP
INTRAMUSCULAR | Status: DC | PRN
  Administered 2013-09-04: 15 mL

## 2013-09-04 MED ORDER — SODIUM CHLORIDE 0.9 % IR SOLN
Status: DC | PRN
  Administered 2013-09-04: 12:00:00

## 2013-09-04 MED ORDER — ACETAMINOPHEN 10 MG/ML IV SOLN
INTRAVENOUS | Status: DC | PRN
  Administered 2013-09-04: 1000 mg via INTRAVENOUS

## 2013-09-04 MED ORDER — LACTATED RINGERS IV SOLN
INTRAVENOUS | Status: DC
  Filled 2013-09-04: qty 1000

## 2013-09-04 SURGICAL SUPPLY — 64 items
ANCH SUT 2 5.5 BABSR ASCP (Orthopedic Implant) ×2 IMPLANT
ANCHOR PEEK ZIP 5.5 NDL NO2 (Orthopedic Implant) ×4 IMPLANT
APL SKNCLS STERI-STRIP NONHPOA (GAUZE/BANDAGES/DRESSINGS) ×1
BENZOIN TINCTURE PRP APPL 2/3 (GAUZE/BANDAGES/DRESSINGS) ×2 IMPLANT
BLADE SURG 15 STRL LF DISP TIS (BLADE) ×1 IMPLANT
BLADE SURG 15 STRL SS (BLADE) ×9
BNDG CMPR 9X4 STRL LF SNTH (GAUZE/BANDAGES/DRESSINGS)
BNDG COHESIVE 3X5 TAN STRL LF (GAUZE/BANDAGES/DRESSINGS) ×3 IMPLANT
BNDG CONFORM 3 STRL LF (GAUZE/BANDAGES/DRESSINGS) ×3 IMPLANT
BNDG ESMARK 4X9 LF (GAUZE/BANDAGES/DRESSINGS) IMPLANT
CANISTER SUCTION 2500CC (MISCELLANEOUS) ×2 IMPLANT
CHLORAPREP W/TINT 26ML (MISCELLANEOUS) ×3 IMPLANT
CLOSURE WOUND 1/2 X4 (GAUZE/BANDAGES/DRESSINGS) ×1
CLOTH BEACON ORANGE TIMEOUT ST (SAFETY) ×3 IMPLANT
COVER TABLE BACK 60X90 (DRAPES) ×3 IMPLANT
CUFF TOURN SGL QUICK 34 (TOURNIQUET CUFF) ×3
CUFF TRNQT CYL 34X4X40X1 (TOURNIQUET CUFF) IMPLANT
DRAPE EXTREMITY T 121X128X90 (DRAPE) ×3 IMPLANT
DRAPE LG THREE QUARTER DISP (DRAPES) ×3 IMPLANT
DRAPE SURG 17X23 STRL (DRAPES) IMPLANT
ELECT REM PT RETURN 9FT ADLT (ELECTROSURGICAL) ×3
ELECTRODE REM PT RTRN 9FT ADLT (ELECTROSURGICAL) ×1 IMPLANT
GAUZE XEROFORM 1X8 LF (GAUZE/BANDAGES/DRESSINGS) ×3 IMPLANT
GLOVE BIO SURGEON STRL SZ7.5 (GLOVE) ×3 IMPLANT
GLOVE BIOGEL M 6.5 STRL (GLOVE) ×2 IMPLANT
GLOVE BIOGEL M STER SZ 6 (GLOVE) ×2 IMPLANT
GLOVE BIOGEL M STRL SZ7.5 (GLOVE) ×2 IMPLANT
GLOVE BIOGEL PI IND STRL 6.5 (GLOVE) IMPLANT
GLOVE BIOGEL PI IND STRL 7.5 (GLOVE) IMPLANT
GLOVE BIOGEL PI INDICATOR 6.5 (GLOVE) ×4
GLOVE BIOGEL PI INDICATOR 7.5 (GLOVE) ×4
GLOVE INDICATOR 7.5 STRL GRN (GLOVE) ×6 IMPLANT
GOWN PREVENTION PLUS LG XLONG (DISPOSABLE) ×1 IMPLANT
GOWN PREVENTION PLUS XLARGE (GOWN DISPOSABLE) ×1 IMPLANT
GOWN STRL REUS W/TWL LRG LVL3 (GOWN DISPOSABLE) ×4 IMPLANT
GOWN STRL REUS W/TWL XL LVL3 (GOWN DISPOSABLE) ×4 IMPLANT
NDL HYPO 25X1 1.5 SAFETY (NEEDLE) IMPLANT
NEEDLE HYPO 25X1 1.5 SAFETY (NEEDLE) IMPLANT
NS IRRIG 500ML POUR BTL (IV SOLUTION) ×3 IMPLANT
PACK BASIN DAY SURGERY FS (CUSTOM PROCEDURE TRAY) ×3 IMPLANT
PADDING CAST ABS 4INX4YD NS (CAST SUPPLIES) ×2
PADDING CAST ABS COTTON 4X4 ST (CAST SUPPLIES) ×1 IMPLANT
PATCH TISSUE MEND 5X6CM (Orthopedic Implant) ×2 IMPLANT
PENCIL BUTTON HOLSTER BLD 10FT (ELECTRODE) ×2 IMPLANT
SPLINT PLASTER CAST XFAST 4X15 (CAST SUPPLIES) IMPLANT
SPLINT PLASTER XTRA FAST SET 4 (CAST SUPPLIES) ×2
SPONGE GAUZE 4X4 12PLY (GAUZE/BANDAGES/DRESSINGS) ×3 IMPLANT
SPONGE LAP 4X18 X RAY DECT (DISPOSABLE) ×6 IMPLANT
STAPLER VISISTAT (STAPLE) ×1 IMPLANT
STOCKINETTE 4X48 STRL (DRAPES) ×3 IMPLANT
STRIP CLOSURE SKIN 1/2X4 (GAUZE/BANDAGES/DRESSINGS) ×1 IMPLANT
SUCTION FRAZIER TIP 10 FR DISP (SUCTIONS) ×2 IMPLANT
SUT ETHILON 4 0 PS 2 18 (SUTURE) ×3 IMPLANT
SUT VIC AB 3-0 PS1 18 (SUTURE)
SUT VIC AB 3-0 PS1 18XBRD (SUTURE) IMPLANT
SUT VIC AB 3-0 SH 18 (SUTURE) ×2 IMPLANT
SUT VICRYL 4-0 PS2 18IN ABS (SUTURE) IMPLANT
SYR BULB 3OZ (MISCELLANEOUS) ×3 IMPLANT
SYR CONTROL 10ML LL (SYRINGE) ×6 IMPLANT
TOWEL OR 17X24 6PK STRL BLUE (TOWEL DISPOSABLE) ×3 IMPLANT
TUBE CONNECTING 12'X1/4 (SUCTIONS) ×1
TUBE CONNECTING 12X1/4 (SUCTIONS) ×1 IMPLANT
TUBING SUCTION BULK 100 FT (MISCELLANEOUS) IMPLANT
UNDERPAD 30X30 INCONTINENT (UNDERPADS AND DIAPERS) ×3 IMPLANT

## 2013-09-04 NOTE — Discharge Instructions (Addendum)
Post Anesthesia Home Care Instructions  Activity: Get plenty of rest for the remainder of the day. A responsible adult should stay with you for 24 hours following the procedure.  For the next 24 hours, DO NOT: -Drive a car -Advertising copywriter -Drink alcoholic beverages -Take any medication unless instructed by your physician -Make any legal decisions or sign important papers.  Meals: Start with liquid foods such as gelatin or soup. Progress to regular foods as tolerated. Avoid greasy, spicy, heavy foods. If nausea and/or vomiting occur, drink only clear liquids until the nausea and/or vomiting subsides. Call your physician if vomiting continues.  Special Instructions/Symptoms: Your throat may feel dry or sore from the anesthesia or the breathing tube placed in your throat during surgery. If this causes discomfort, gargle with warm salt water. The discomfort should disappear within 24 hours. Information for Discharge Teaching: EXPAREL (bupivacaine liposome injectable suspension)   Your surgeon gave you EXPAREL(bupivacaine) in your surgical incision to help control your pain after surgery.   EXPAREL is a local anesthetic that provides pain relief by numbing the tissue around the surgical site.  EXPAREL is designed to release pain medication over time and can control pain for up to 72 hours.  Depending on how you respond to EXPAREL, you may require less pain medication during your recovery.  Possible side effects:  Temporary loss of sensation or ability to move in the area where bupivacaine was injected.  Nausea, vomiting, constipation  Rarely, numbness and tingling in your mouth or lips, lightheadedness, or anxiety may occur.  Call your doctor right away if you think you may be experiencing any of these sensations, or if you have other questions regarding possible side effects.  Follow all other discharge instructions given to you by your surgeon or nurse. Eat a healthy diet and  drink plenty of water or other fluids.  If you return to the hospital for any reason within 96 hours following the administration of EXPAREL, please inform your health care providers. Call your surgeon if you experience:   1.  Fever over 101.0. 2.  Inability to urinate. 3.  Nausea and/or vomiting. 4.  Extreme swelling or bruising at the surgical site. 5.  Continued bleeding from the incision. 6.  Increased pain, redness or drainage from the incision. 7.  Problems related to your pain medication.  Regional Anesthesia Blocks  1. Numbness or the inability to move the "blocked" extremity may last from 3-48 hours after placement. The length of time depends on the medication injected and your individual response to the medication. If the numbness is not going away after 48 hours, call your surgeon.  2. The extremity that is blocked will need to be protected until the numbness is gone and the  Strength has returned. Because you cannot feel it, you will need to take extra care to avoid injury. Because it may be weak, you may have difficulty moving it or using it. You may not know what position it is in without looking at it while the block is in effect.  3. For blocks in the legs and feet, returning to weight bearing and walking needs to be done carefully. You will need to wait until the numbness is entirely gone and the strength has returned. You should be able to move your leg and foot normally before you try and bear weight or walk. You will need someone to be with you when you first try to ensure you do not fall and possibly risk injury.  4. Bruising and tenderness at the needle site are common side effects and will resolve in a few days.  5. Persistent numbness or new problems with movement should be communicated to the surgeon or the [facility] [phone])/ [facility] [phone]).  Follow up in clinic on Tuesday Strict non weight  Bearing on left foot Start  Lovenox today Call on call at [phone] with any issues

## 2013-09-04 NOTE — Transfer of Care (Cosign Needed)
Immediate Anesthesia Transfer of Care Note  Patient: Zachary Grant  Procedure(s) Performed: Procedure(s): LEFT ACHILLES TENDON REPAIR  (Left)  Patient Location: PACU  Anesthesia Type:General  Level of Consciousness: sedated  Airway & Oxygen Therapy: Patient Spontanous Breathing and Patient connected to face mask oxygen  Post-op Assessment: Report given to PACU RN  Post vital signs: Reviewed and stable  Complications: No apparent anesthesia complications

## 2013-09-04 NOTE — Op Note (Signed)
PREOPERATIVE DIAGNOSIS: 1. Acute Rupture of Achilles Tendon Left foot   POSTOPERATIVE DIAGNOSIS: SAME  OPERATION: 1. Primary open repair of Achilles Tendon left  2. Application of Below the Knee Fiberglass Cast  ANESTHESIA:  General with Popliteal block    SURGEON: Dr. Ludger Nutting  ASSISTANTS: Dr. Larey Dresser  HEMOSTASIS: Pneumatic thigh tourniquet   ESTIMATED BLOOD LOSS: Minimal  MATERIALS: Nylon and Vicryl suture; Tissue Mend 5x6 graft; Stryker bone anchor x2    OPERATIVE PROCEDURE:  DETAILS OF OPERATIVE PROCEDURE:  The patient was brought into the operating room and following induction of general anesthesia a well padded pneumatic thigh tourniquet was placed on the patient's left thigh.  The patient was placed on the operating room table in the prone position.  After proper bolstering and positioning of the patient, the left leg was then scrubbed, prepped and draped in the usual aseptic manner.  The left leg was exsanguinated using an Esmarch, and the tourniquet was inflated to 300 mmHg.  Attention was directed to the posterior aspect of the left leg where an approximately 5 cm linear incision was made in the center of the Achilles tendon which began the distal 1/3 of the leg and proceeded 3-cm distal to the insertion of the Achilles tendon, over the previous incision.  The incision was deepened to the level of the para-Tenon using sharp and blunt dissection.  There was scar tissue present from the previous surgery.   The para-Tenon was incised and the Achilles tendon was visualized.  It was noted that there was a complete rupture of the tendon approximately 3-cm from the tendon's insertion on the calcaneus with a deficit noted.  The tendon appeared to have a mop head appearance with surrounding hematoma and edema extending for approximately 3-cm from its insertion to approximately 5-cm from the insertion.  The devitalized area of the tendon was debrided of nonviable tissue and  hematoma formation.  The Achilles tendon was peeled from underlying periosteum of the posterior calcaneus to expose the previous anchor and sutures.  The sutures were removed and passed from the surgical field.   The Achilles tendon was debrided of all diseased tissue, with only viable healthy tissue remaining.   The wound was flushed with copious amounts of sterile saline. Using the Stryker bone anchor system, 2 anchors were used to repair the tendon to the calcaneus.   The tendon was repaired using Krakow technique and then re enforced with 5cm x 6cm Tissue Mend using non absorbable sutures.   The para tendon was then re approximated using 3-0 Vicryl in a simple suture fashion and the wound was again flushed with copious amounts of sterile saline.  Subcutaneous tissue was closed with 4-0 Vicryl and the skin was closed with 4-0 nylon.    A postoperative injection was infiltrated around the surgical site.  A sterile dressing consisting of steri strips, gauze and Kling was applied to the left leg.  At this time the tourniquet was deflated an immediate hyperemia was noted to all digits of the right foot. Several layers of Webril were applied to the foot just above the knee and an above knee fiberglass cast was applied with the knee bent at 30 degrees and the foot placed at 90-degrees to the leg.  The patient tolerated the procedure well and was transferred from the OR to PACU with vital signs stable and vascular status intact to the left foot.

## 2013-09-04 NOTE — Anesthesia Procedure Notes (Addendum)
Anesthesia Regional Block:  Popliteal block  Pre-Anesthetic Checklist: ,, timeout performed, Correct Patient, Correct Site, Correct Laterality, Correct Procedure, Correct Position, site marked, Risks and benefits discussed,  Surgical consent,  Pre-op evaluation,  At surgeon's request and post-op pain management  Laterality: Left  Prep: Dura Prep       Needles:  Injection technique: Single-shot  Needle Type: Echogenic Needle          Additional Needles:  Procedures: ultrasound guided (picture in chart) Popliteal block Narrative:  Start time: 09/04/2013 10:30 AM End time: 09/04/2013 10:40 AM Injection made incrementally with aspirations every 5 mL.  Performed by: Personally  Anesthesiologist: Lucille Passy MD   Procedure Name: Intubation Date/Time: 09/04/2013 11:56 AM Performed by: Briant Sites Pre-anesthesia Checklist: Patient identified, Emergency Drugs available, Suction available and Patient being monitored Patient Re-evaluated:Patient Re-evaluated prior to inductionOxygen Delivery Method: Circle System Utilized Preoxygenation: Pre-oxygenation with 100% oxygen Intubation Type: IV induction Ventilation: Mask ventilation without difficulty Laryngoscope Size: Mac and 4 Grade View: Grade III Tube type: Oral Tube size: 8.0 mm Number of attempts: 1 Airway Equipment and Method: stylet and oral airway Placement Confirmation: ETT inserted through vocal cords under direct vision,  positive ETCO2 and breath sounds checked- equal and bilateral Secured at: 22 cm Tube secured with: Tape Dental Injury: Teeth and Oropharynx as per pre-operative assessment

## 2013-09-04 NOTE — Brief Op Note (Signed)
09/04/2013  2:50 PM  PATIENT:  Zachary Grant  38 y.o. male  PRE-OPERATIVE DIAGNOSIS:  LEFT TRAUMATIC  REPUTURE ACHILLES TENDON   POST-OPERATIVE DIAGNOSIS:  LEFT TRAUMATIC  REPUTURE ACHILLES TENDON   PROCEDURE:  Procedure(s): LEFT ACHILLES TENDON REPAIR  (Left)  SURGEON:  Surgeon(s) and Role:    * Ludger Nutting, MD - Primary  PHYSICIAN ASSISTANT:   ASSISTANTS: Larey Dresser   ANESTHESIA:   general  EBL:  Total I/O In: 1400 [I.V.:1400] Out: -   BLOOD ADMINISTERED:none  DRAINS: none   LOCAL MEDICATIONS USED:  NONE  SPECIMEN:  No Specimen  DISPOSITION OF SPECIMEN:  N/A  COUNTS:  yes  TOURNIQUET:   Total Tourniquet Time Documented: Thigh (Left) - 121 minutes Total: Thigh (Left) - 121 minutes   DICTATION: .Note written in EPIC  PLAN OF CARE: Discharge to home after PACU  PATIENT DISPOSITION:  Short Stay   Delay start of Pharmacological VTE agent (>24hrs) due to surgical blood loss or risk of bleeding: no

## 2013-09-04 NOTE — Anesthesia Preprocedure Evaluation (Signed)
Anesthesia Evaluation  Patient identified by MRN, date of birth, ID band Patient awake    Reviewed: Allergy & Precautions, H&P , NPO status , Patient's Chart, lab work & pertinent test results  Airway Mallampati: II TM Distance: >3 FB Neck ROM: Full    Dental  (+) Teeth Intact, Dental Advisory Given   Pulmonary asthma , sleep apnea and Continuous Positive Airway Pressure Ventilation , former smoker,  breath sounds clear to auscultation  Pulmonary exam normal       Cardiovascular negative cardio ROS  Rhythm:Regular Rate:Normal     Neuro/Psych negative neurological ROS  negative psych ROS   GI/Hepatic negative GI ROS, Neg liver ROS,   Endo/Other  negative endocrine ROS  Renal/GU negative Renal ROS  negative genitourinary   Musculoskeletal negative musculoskeletal ROS (+)   Abdominal   Peds negative pediatric ROS (+)  Hematology negative hematology ROS (+)   Anesthesia Other Findings   Reproductive/Obstetrics negative OB ROS                           Anesthesia Physical Anesthesia Plan  ASA: I  Anesthesia Plan: General   Post-op Pain Management:    Induction: Intravenous  Airway Management Planned: Oral ETT  Additional Equipment:   Intra-op Plan:   Post-operative Plan: Extubation in OR  Informed Consent: I have reviewed the patients History and Physical, chart, labs and discussed the procedure including the risks, benefits and alternatives for the proposed anesthesia with the patient or authorized representative who has indicated his/her understanding and acceptance.   Dental advisory given  Plan Discussed with: CRNA  Anesthesia Plan Comments:         Anesthesia Quick Evaluation

## 2013-09-06 NOTE — Anesthesia Postprocedure Evaluation (Signed)
  Anesthesia Post-op Note  Patient: Zachary Grant  Procedure(s) Performed: Procedure(s) (LRB): LEFT ACHILLES TENDON REPAIR  (Left)  Patient Location: PACU  Anesthesia Type: MAC combined with regional for post-op pain  Level of Consciousness: awake and alert   Airway and Oxygen Therapy: Patient Spontanous Breathing  Post-op Pain: mild  Post-op Assessment: Post-op Vital signs reviewed, Patient's Cardiovascular Status Stable, Respiratory Function Stable, Patent Airway and No signs of Nausea or vomiting  Last Vitals:  Filed Vitals:   09/04/13 1600  BP: 105/67  Pulse: 97  Temp: 36.1 C  Resp: 16    Post-op Vital Signs: stable   Complications: No apparent anesthesia complications

## 2013-09-08 ENCOUNTER — Encounter (HOSPITAL_BASED_OUTPATIENT_CLINIC_OR_DEPARTMENT_OTHER): Payer: Self-pay | Admitting: Foot & Ankle Surgery

## 2014-01-13 ENCOUNTER — Other Ambulatory Visit: Payer: Self-pay | Admitting: Otolaryngology

## 2014-01-13 DIAGNOSIS — H9120 Sudden idiopathic hearing loss, unspecified ear: Secondary | ICD-10-CM

## 2014-01-17 ENCOUNTER — Emergency Department (HOSPITAL_COMMUNITY)
Admission: EM | Admit: 2014-01-17 | Discharge: 2014-01-17 | Disposition: A | Discharge status: Home / Self Care | Payer: BC Managed Care – PPO | Attending: Emergency Medicine | Admitting: Emergency Medicine

## 2014-01-17 DIAGNOSIS — G4489 Other headache syndrome: Secondary | ICD-10-CM | POA: Insufficient documentation

## 2014-01-17 DIAGNOSIS — Z79899 Other long term (current) drug therapy: Secondary | ICD-10-CM | POA: Insufficient documentation

## 2014-01-17 DIAGNOSIS — J45909 Unspecified asthma, uncomplicated: Secondary | ICD-10-CM | POA: Diagnosis not present

## 2014-01-17 DIAGNOSIS — Z87891 Personal history of nicotine dependence: Secondary | ICD-10-CM | POA: Diagnosis not present

## 2014-01-17 DIAGNOSIS — R51 Headache: Secondary | ICD-10-CM | POA: Diagnosis present

## 2014-01-17 DIAGNOSIS — Z9981 Dependence on supplemental oxygen: Secondary | ICD-10-CM | POA: Insufficient documentation

## 2014-01-17 DIAGNOSIS — Z87828 Personal history of other (healed) physical injury and trauma: Secondary | ICD-10-CM | POA: Diagnosis not present

## 2014-01-17 DIAGNOSIS — Z7952 Long term (current) use of systemic steroids: Secondary | ICD-10-CM | POA: Insufficient documentation

## 2014-01-17 DIAGNOSIS — G4733 Obstructive sleep apnea (adult) (pediatric): Secondary | ICD-10-CM | POA: Insufficient documentation

## 2014-01-17 DIAGNOSIS — Z7982 Long term (current) use of aspirin: Secondary | ICD-10-CM | POA: Diagnosis not present

## 2014-01-17 MED ORDER — HYDROCODONE-ACETAMINOPHEN 5-325 MG PO TABS: 2.0000 | ORAL_TABLET | ORAL | Status: DC | PRN

## 2014-01-17 NOTE — ED Provider Notes (Signed)
CSN: [id number]     Arrival date & time 01/17/14  1059 History   First MD Initiated Contact with Patient 01/17/14 1106     Chief Complaint  Patient presents with  . Headache  . Ear Injury     (Consider location/radiation/quality/duration/timing/severity/associated sxs/prior Treatment) HPI  Zachary Grant is a 38 y.o. male who presents for evaluation of intermittent headache, present for 7 days, lasting 6-7 at that time, right-sided only. He is using ibuprofen sporadically, when he has the pain, but the headaches tend to return. He denies fever, nausea, vomiting, weakness, dizziness, or paresthesias. He is recovering from a Achilles tendon repair, and receiving skin grafts of the left posterior ankle. He saw an ENT doctor 6 days ago and was evaluated for right-sided hearing loss, at that time. He was placed on a prednisone taper, and is taking it as directed. An MRI is scheduled in 5 days to evaluate hearing loss. He has never had this combination of problems previously. There are no other known modifying factors.    Past Medical History  Diagnosis Date  . Traumatic rupture of left Achilles tendon   . Mild asthma   . OSA on CPAP     per study  05-04-2013 mild osa  . Wears contact lenses    Past Surgical History  Procedure Laterality Date  . Achilles tendon repair Left 2014    and removal spur  . Tonsillectomy  age 38  . Cardiovascular stress test  05-25-2011   DR Jacinto HalimGANJI    NORMAL PERFUSION/  NORMAL EF  . Achilles tendon surgery Left 09/04/2013    Procedure: LEFT ACHILLES TENDON REPAIR ;  Surgeon: Ludger NuttingN'Tuma Jah, MD;  [facility];  Service: Podiatry;  Laterality: Left;   Family History  Problem Relation Age of Onset  . Asthma Mother    History  Substance Use Topics  . Smoking status: Former Smoker -- 1.00 packs/day for 7 years    Types: Cigarettes    Quit date: 06/03/2008  . Smokeless tobacco: Never Used  . Alcohol Use: No    Review of Systems  All other  systems reviewed and are negative.     Allergies  Review of patient's allergies indicates no known allergies.  Home Medications   Prior to Admission medications   Medication Sig Start Date End Date Taking? Authorizing Provider  albuterol (PROVENTIL HFA;VENTOLIN HFA) 108 (90 BASE) MCG/ACT inhaler Inhale 1-2 puffs into the lungs every 6 (six) hours as needed for wheezing or shortness of breath.   Yes Historical Provider, MD  aspirin EC 81 MG tablet Take 162 mg by mouth daily.   Yes Historical Provider, MD  ibuprofen (ADVIL,MOTRIN) 800 MG tablet Take 800 mg by mouth every 6 (six) hours as needed. 08/26/13  Yes Cathlyn ParsonsAngela M Kabbe, NP  Multiple Vitamin (MULTIVITAMIN WITH MINERALS) TABS tablet Take 1 tablet by mouth daily.   Yes Historical Provider, MD  Omega-3 Fatty Acids (FISH OIL) 1000 MG CAPS Take 1 capsule by mouth daily.   Yes Historical Provider, MD  predniSONE (DELTASONE) 20 MG tablet Take 40 mg by mouth daily with breakfast.  01/12/14  Yes Historical Provider, MD  HYDROcodone-acetaminophen (NORCO) 5-325 MG per tablet Take 2 tablets by mouth every 4 (four) hours as needed. 01/17/14   Flint MelterElliott L Lebert Lovern, MD   Pulse 93  Temp(Src) 98.4 F (36.9 C) (Oral)  Resp 12  Ht 5\' 8"  (1.727 m)  Wt 195 lb (88.451 kg)  BMI 29.66 kg/m2  SpO2  98% Physical Exam  Nursing note and vitals reviewed. Constitutional: He is oriented to person, place, and time. He appears well-developed and well-nourished.  HENT:  Head: Normocephalic and atraumatic.  Right Ear: External ear normal.  Left Ear: External ear normal.  TMs appear normal, bilaterally.  Eyes: Conjunctivae and EOM are normal. Pupils are equal, round, and reactive to light.  Neck: Normal range of motion and phonation normal. Neck supple.  There is no meningismus  Cardiovascular: Normal rate, regular rhythm and normal heart sounds.   Pulmonary/Chest: Effort normal and breath sounds normal. He exhibits no bony tenderness.  Abdominal: Soft. There is no  tenderness.  Musculoskeletal: Normal range of motion.  Neurological: He is alert and oriented to person, place, and time. No cranial nerve deficit or sensory deficit. He exhibits normal muscle tone. Coordination normal.  No ataxia  Skin: Skin is warm, dry and intact.  Psychiatric: He has a normal mood and affect. His behavior is normal. Judgment and thought content normal.    ED Course  Procedures (including critical care time)   Findings discussed with the patient. He has a normal physical examination. There is no indication for further evaluation or advanced imaging, in the emergency department setting. All questions answered for patient and his wife.  Labs Review Labs Reviewed - No data to display  Imaging Review No results found.   EKG Interpretation None      MDM   Final diagnoses:  Other headache syndrome    Nonspecific headache, which is intermittent. There is a wide differential diagnosis for this type of headache. I doubt meningitis, nerve impingement or metabolic instability, a source of his discomfort.  Nursing Notes Reviewed/ Care Coordinated Applicable Imaging Reviewed Interpretation of Laboratory Data incorporated into ED treatment  The patient appears reasonably screened and/or stabilized for discharge and I doubt any other medical condition or other [facility] requiring further screening, evaluation, or treatment in the ED at this time prior to discharge.  Plan: Home Medications- Norco; Home Treatments- rest; return here if the recommended treatment, does not improve the symptoms; Recommended follow up- ENT as scheduled. Neurology f/u prn for HA evaluation    Flint Melter, MD 01/17/14 1229

## 2014-01-17 NOTE — Discharge Instructions (Signed)
There is no clear cause for your intermittent headache. The headache may improve as you complete the treatment with prednisone. Your physical examination today, was normal. The scheduled MRI, may diagnose a problem that can be treated, and improve the headaches. We will refer, you to a neurologist, to see if you have persistent, headaches, and the MRI does not show a problem that can be treated.    General Headache Without Cause A headache is pain or discomfort felt around the head or neck area. The specific cause of a headache may not be found. There are many causes and types of headaches. A few common ones are:  Tension headaches.  Migraine headaches.  Cluster headaches.  Chronic daily headaches. HOME CARE INSTRUCTIONS   Keep all follow-up appointments with your caregiver or any specialist referral.  Only take over-the-counter or prescription medicines for pain or discomfort as directed by your caregiver.  Lie down in a dark, quiet room when you have a headache.  Keep a headache journal to find out what may trigger your migraine headaches. For example, write down:  What you eat and drink.  How much sleep you get.  Any change to your diet or medicines.  Try massage or other relaxation techniques.  Put ice packs or heat on the head and neck. Use these 3 to 4 times per day for 15 to 20 minutes each time, or as needed.  Limit stress.  Sit up straight, and do not tense your muscles.  Quit smoking if you smoke.  Limit alcohol use.  Decrease the amount of caffeine you drink, or stop drinking caffeine.  Eat and sleep on a regular schedule.  Get 7 to 9 hours of sleep, or as recommended by your caregiver.  Keep lights dim if bright lights bother you and make your headaches worse. SEEK MEDICAL CARE IF:   You have problems with the medicines you were prescribed.  Your medicines are not working.  You have a change from the usual headache.  You have nausea or  vomiting. SEEK IMMEDIATE MEDICAL CARE IF:   Your headache becomes severe.  You have a fever.  You have a stiff neck.  You have loss of vision.  You have muscular weakness or loss of muscle control.  You start losing your balance or have trouble walking.  You feel faint or pass out.  You have severe symptoms that are different from your first symptoms. MAKE SURE YOU:   Understand these instructions.  Will watch your condition.  Will get help right away if you are not doing well or get worse. Document Released: 04/02/2005 Document Revised: 06/25/2011 Document Reviewed: 04/18/2011 [facility] Patient Information 2015 [employer], [place]. This information is not intended to replace advice given to you by your health care provider. Make sure you discuss any questions you have with your health care provider.

## 2014-01-17 NOTE — ED Notes (Addendum)
Pt reports a new Sx of HA that started SAT.Pt  Also reports  hearing loss on 01-11-14 the patient has been seen and treated per DR. Wolicki and started on prednisone. Pt feels like ear is worse and is causing HA. Pt has a MRI for Thursday as out PT.

## 2014-01-21 ENCOUNTER — Ambulatory Visit
Admission: RE | Admit: 2014-01-21 | Discharge: 2014-01-21 | Disposition: A | Discharge status: Home / Self Care | Payer: BC Managed Care – PPO | Source: Ambulatory Visit | Attending: Otolaryngology | Admitting: Otolaryngology

## 2014-01-21 DIAGNOSIS — H9120 Sudden idiopathic hearing loss, unspecified ear: Secondary | ICD-10-CM

## 2014-01-21 MED ORDER — GADOBENATE DIMEGLUMINE 529 MG/ML IV SOLN
19.0000 mL | Freq: Once | INTRAVENOUS | Status: AC | PRN
  Administered 2014-01-21: 19 mL via INTRAVENOUS

## 2014-01-29 ENCOUNTER — Encounter: Payer: Self-pay | Admitting: Neurology

## 2014-01-30 ENCOUNTER — Emergency Department (HOSPITAL_COMMUNITY)
Admission: EM | Admit: 2014-01-30 | Discharge: 2014-01-30 | Disposition: A | Discharge status: Home / Self Care | Payer: BC Managed Care – PPO | Attending: Emergency Medicine | Admitting: Emergency Medicine

## 2014-01-30 ENCOUNTER — Encounter (HOSPITAL_COMMUNITY): Payer: Self-pay | Admitting: Emergency Medicine

## 2014-01-30 DIAGNOSIS — R519 Headache, unspecified: Secondary | ICD-10-CM

## 2014-01-30 DIAGNOSIS — Z79899 Other long term (current) drug therapy: Secondary | ICD-10-CM | POA: Diagnosis not present

## 2014-01-30 DIAGNOSIS — Z87828 Personal history of other (healed) physical injury and trauma: Secondary | ICD-10-CM | POA: Insufficient documentation

## 2014-01-30 DIAGNOSIS — Z7982 Long term (current) use of aspirin: Secondary | ICD-10-CM | POA: Insufficient documentation

## 2014-01-30 DIAGNOSIS — R51 Headache: Secondary | ICD-10-CM | POA: Insufficient documentation

## 2014-01-30 DIAGNOSIS — J45998 Other asthma: Secondary | ICD-10-CM | POA: Insufficient documentation

## 2014-01-30 DIAGNOSIS — G4733 Obstructive sleep apnea (adult) (pediatric): Secondary | ICD-10-CM | POA: Diagnosis not present

## 2014-01-30 DIAGNOSIS — Z7952 Long term (current) use of systemic steroids: Secondary | ICD-10-CM | POA: Insufficient documentation

## 2014-01-30 DIAGNOSIS — Z87891 Personal history of nicotine dependence: Secondary | ICD-10-CM | POA: Diagnosis not present

## 2014-01-30 HISTORY — DX: Unspecified hearing loss, right ear: H91.91

## 2014-01-30 MED ORDER — DIPHENHYDRAMINE HCL 25 MG PO CAPS
25.0000 mg | ORAL_CAPSULE | Freq: Once | ORAL | Status: AC
  Administered 2014-01-30: 25 mg via ORAL
  Filled 2014-01-30: qty 1

## 2014-01-30 MED ORDER — METOCLOPRAMIDE HCL 10 MG PO TABS
10.0000 mg | ORAL_TABLET | Freq: Once | ORAL | Status: AC
  Administered 2014-01-30: 10 mg via ORAL
  Filled 2014-01-30: qty 1

## 2014-01-30 MED ORDER — TOPIRAMATE 25 MG PO TABS: ORAL_TABLET | ORAL | Status: DC

## 2014-01-30 NOTE — ED Notes (Signed)
Waiting on medication from pharmacy

## 2014-01-30 NOTE — ED Provider Notes (Signed)
CSN: [id number]     Arrival date & time 01/30/14  1752 History   First MD Initiated Contact with Patient 01/30/14 1952     Chief Complaint  Patient presents with  . Headache     (Consider location/radiation/quality/duration/timing/severity/associated sxs/prior Treatment) HPI Comments: 38 year old male with history of asthma, hearing loss right ear, currently being worked up by ENT physician for headaches and hearing loss in the right side the past 3 weeks presents with worsening headaches. Patient's had increased frequency of similar headaches the past few days. Patient 5 episodes today lasting partially 15-20 minutes each.Located right parietal region. These are similar to the ones he had subtle T. in the fours MRI of the head. MRI results reviewed no acute findings. Patient being treated with prednisone and has followup with specialist ENT and neurology. Currently minimal headache. Patient denies neuro symptoms except for hearing loss which she's had for 3 weeks. Patient has not tried anything for the headache because they resolve before medicines would take action.  Patient is a 38 y.o. male presenting with headaches. The history is provided by the patient.  Headache Associated symptoms: no abdominal pain, no back pain, no congestion, no fever, no neck pain, no neck stiffness, no numbness, no photophobia and no vomiting     Past Medical History  Diagnosis Date  . Traumatic rupture of left Achilles tendon   . Mild asthma   . OSA on CPAP     per study  05-04-2013 mild osa  . Wears contact lenses   . Hearing loss of right ear    Past Surgical History  Procedure Laterality Date  . Achilles tendon repair Left 2014    and removal spur  . Tonsillectomy  age 38  . Cardiovascular stress test  05-25-2011   DR Jacinto HalimGANJI    NORMAL PERFUSION/  NORMAL EF  . Achilles tendon surgery Left 09/04/2013    Procedure: LEFT ACHILLES TENDON REPAIR ;  Surgeon: Ludger NuttingN'Tuma Jah, MD;  Location: [facility];  Service: Podiatry;  Laterality: Left;   Family History  Problem Relation Age of Onset  . Asthma Mother    History  Substance Use Topics  . Smoking status: Former Smoker -- 1.00 packs/day for 7 years    Types: Cigarettes    Quit date: 06/03/2008  . Smokeless tobacco: Never Used  . Alcohol Use: No    Review of Systems  Constitutional: Negative for fever and chills.  HENT: Negative for congestion.   Eyes: Negative for photophobia and visual disturbance.  Respiratory: Negative for shortness of breath.   Cardiovascular: Negative for chest pain.  Gastrointestinal: Negative for vomiting and abdominal pain.  Genitourinary: Negative for dysuria and flank pain.  Musculoskeletal: Negative for back pain, neck pain and neck stiffness.  Skin: Negative for rash.  Neurological: Positive for headaches. Negative for weakness, light-headedness and numbness.      Allergies  Review of patient's allergies indicates no known allergies.  Home Medications   Prior to Admission medications   Medication Sig Start Date End Date Taking? Authorizing Provider  albuterol (PROVENTIL HFA;VENTOLIN HFA) 108 (90 BASE) MCG/ACT inhaler Inhale 1-2 puffs into the lungs every 6 (six) hours as needed for wheezing or shortness of breath.   Yes Historical Provider, MD  aspirin EC 81 MG tablet Take 162 mg by mouth daily.   Yes Historical Provider, MD  HYDROcodone-acetaminophen (NORCO) 5-325 MG per tablet Take 2 tablets by mouth every 4 (four) hours as needed. 01/17/14  Yes Mechele CollinElliott  Rachel MouldsL Wentz, MD  ibuprofen (ADVIL,MOTRIN) 800 MG tablet Take 800 mg by mouth every 6 (six) hours as needed for moderate pain.  08/26/13  Yes Cathlyn ParsonsAngela M Kabbe, NP  Multiple Vitamin (MULTIVITAMIN WITH MINERALS) TABS tablet Take 1 tablet by mouth daily.   Yes Historical Provider, MD  Omega-3 Fatty Acids (FISH OIL) 1000 MG CAPS Take 1 capsule by mouth daily.   Yes Historical Provider, MD  predniSONE (DELTASONE) 20 MG tablet Take 10 mg by mouth  daily with breakfast.  01/12/14  Yes Historical Provider, MD  topiramate (TOPAMAX) 25 MG tablet Take 1 tablet daily for 1 week then increase to 2 tablets/50 mg daily until you see your physician. 01/30/14   Enid SkeensJoshua M Harve Spradley, MD   BP 127/84  Pulse 88  Temp(Src) 98.3 F (36.8 C) (Oral)  Resp 14  Ht 5\' 8"  (1.727 m)  Wt 195 lb (88.451 kg)  BMI 29.66 kg/m2  SpO2 96% Physical Exam  Nursing note and vitals reviewed. Constitutional: He is oriented to person, place, and time. He appears well-developed and well-nourished.  HENT:  Head: Normocephalic and atraumatic.  Eyes: Conjunctivae are normal. Right eye exhibits no discharge. Left eye exhibits no discharge.  Neck: Normal range of motion. Neck supple. No tracheal deviation present.  Cardiovascular: Normal rate and regular rhythm.   Pulmonary/Chest: Effort normal and breath sounds normal.  Abdominal: Soft. He exhibits no distension. There is no tenderness. There is no guarding.  Musculoskeletal: He exhibits no edema.  Neurological: He is alert and oriented to person, place, and time. GCS eye subscore is 4. GCS verbal subscore is 5. GCS motor subscore is 6.  5+ strength in UE and LE with f/e at major joints. Sensation to palpation intact in UE and LE. CNs 2-12 grossly intac except right hearing  EOMFI.  PERRL.   Finger nose and coordination intact bilateral.   Visual fields intact to finger testing. No temple tenderness, mild right TMJ pain  Skin: Skin is warm. No rash noted.  Psychiatric: He has a normal mood and affect.    ED Course  Procedures (including critical care time) Labs Review Labs Reviewed - No data to display  Imaging Review No results found.   EKG Interpretation None      MDM   Final diagnoses:  Right-sided headache   Patient with increased frequency of similar headaches. Patient has R. to have MRI with no acute findings. Patient has no significant headache currently, mild migraine cocktail ordered. Discussed  the case with neurology who recommends starting Topamax and we'll try to get him into outpatient neurology sooner.  Results and differential diagnosis were discussed with the patient/parent/guardian. Close follow up outpatient was discussed, comfortable with the plan.   Medications  metoCLOPramide (REGLAN) tablet 10 mg (not administered)  diphenhydrAMINE (BENADRYL) capsule 25 mg (not administered)    Filed Vitals:   01/30/14 2030 01/30/14 2045 01/30/14 2100 01/30/14 2112  BP: 127/81 143/86 127/84 127/84  Pulse: 91 86 85 88  Temp:      TempSrc:      Resp: 32 13 25 14   Height:      Weight:      SpO2: 94% 97% 96% 96%    Final diagnoses:  Right-sided headache        Enid SkeensJoshua M Lenin Kuhnle, MD 01/30/14 2131

## 2014-01-30 NOTE — ED Notes (Signed)
Pt reports having frequent headaches since Sept 30, 2015, when he experienced sudden hearing loss on RT side. Pt being seen by ENT.  Pt states "HA come on gradually, on RT side of head, lasting 20-25 min, then it will go away". Denies N/V or blurred vision. AO x4. Pt experiencing 5/10 HA in triage. NAD noted. Pt took Tylenol at noon and Prednisone at 1400 today.

## 2014-01-30 NOTE — Discharge Instructions (Signed)
If you were given medicines take as directed.  If you are on coumadin or contraceptives realize their levels and effectiveness is altered by many different medicines.  If you have any reaction (rash, tongues swelling, other) to the medicines stop taking and see a physician.   Please follow up as directed and return to the ER or see a physician for new or worsening symptoms.  Thank you. Filed Vitals:   01/30/14 2030 01/30/14 2045 01/30/14 2100 01/30/14 2112  BP: 127/81 143/86 127/84 127/84  Pulse: 91 86 85 88  Temp:      TempSrc:      Resp: 32 13 25 14   Height:      Weight:      SpO2: 94% 97% 96% 96%

## 2014-02-01 ENCOUNTER — Encounter: Payer: Self-pay | Admitting: Neurology

## 2014-02-03 ENCOUNTER — Encounter (HOSPITAL_COMMUNITY): Payer: Self-pay | Admitting: Emergency Medicine

## 2014-02-03 ENCOUNTER — Emergency Department (HOSPITAL_COMMUNITY)
Admission: EM | Admit: 2014-02-03 | Discharge: 2014-02-04 | Disposition: A | Discharge status: Home / Self Care | Payer: BC Managed Care – PPO | Attending: Emergency Medicine | Admitting: Emergency Medicine

## 2014-02-03 DIAGNOSIS — Z7982 Long term (current) use of aspirin: Secondary | ICD-10-CM | POA: Diagnosis not present

## 2014-02-03 DIAGNOSIS — J45909 Unspecified asthma, uncomplicated: Secondary | ICD-10-CM | POA: Diagnosis not present

## 2014-02-03 DIAGNOSIS — Z7952 Long term (current) use of systemic steroids: Secondary | ICD-10-CM | POA: Diagnosis not present

## 2014-02-03 DIAGNOSIS — Z9981 Dependence on supplemental oxygen: Secondary | ICD-10-CM | POA: Diagnosis not present

## 2014-02-03 DIAGNOSIS — G4489 Other headache syndrome: Secondary | ICD-10-CM | POA: Diagnosis not present

## 2014-02-03 DIAGNOSIS — R51 Headache: Secondary | ICD-10-CM | POA: Diagnosis present

## 2014-02-03 DIAGNOSIS — Z87891 Personal history of nicotine dependence: Secondary | ICD-10-CM | POA: Insufficient documentation

## 2014-02-03 DIAGNOSIS — Z79899 Other long term (current) drug therapy: Secondary | ICD-10-CM | POA: Diagnosis not present

## 2014-02-03 DIAGNOSIS — R42 Dizziness and giddiness: Secondary | ICD-10-CM

## 2014-02-03 DIAGNOSIS — G4733 Obstructive sleep apnea (adult) (pediatric): Secondary | ICD-10-CM | POA: Insufficient documentation

## 2014-02-03 DIAGNOSIS — Z87828 Personal history of other (healed) physical injury and trauma: Secondary | ICD-10-CM | POA: Diagnosis not present

## 2014-02-03 DIAGNOSIS — H9191 Unspecified hearing loss, right ear: Secondary | ICD-10-CM | POA: Diagnosis not present

## 2014-02-03 MED ORDER — KETOROLAC TROMETHAMINE 30 MG/ML IJ SOLN
30.0000 mg | Freq: Once | INTRAMUSCULAR | Status: AC
  Administered 2014-02-03: 30 mg via INTRAVENOUS
  Filled 2014-02-03: qty 1

## 2014-02-03 MED ORDER — SODIUM CHLORIDE 0.9 % IV BOLUS (SEPSIS)
1000.0000 mL | Freq: Once | INTRAVENOUS | Status: AC
  Administered 2014-02-03: 1000 mL via INTRAVENOUS

## 2014-02-03 MED ORDER — TETRACAINE HCL 0.5 % OP SOLN
2.0000 [drp] | Freq: Once | OPHTHALMIC | Status: AC
  Administered 2014-02-03: 2 [drp] via OPHTHALMIC
  Filled 2014-02-03: qty 2

## 2014-02-03 NOTE — ED Provider Notes (Signed)
CSN: [id number]     Arrival date & time 02/03/14  1842 History   First MD Initiated Contact with Patient 02/03/14 2056     Chief Complaint  Patient presents with  . Headache     (Consider location/radiation/quality/duration/timing/severity/associated sxs/prior Treatment) The history is provided by the patient and medical records. No language interpreter was used.    Zachary RunnerRicky Grant is a 38 y.o. male  with a hx of asthma, hearing loss right ear, sleep apnea currently being worked up by ENT physician for headaches and hearing loss in the right side the past 3 weeks who presents to the Emergency Department complaining of intermittent headaches increasing in frequency to 6-8 per day.  Patient's had increased frequency of similar headaches the past few days. Pt reports headaches come in 10-15 min episodes and are now associated with eye pressure/pain and lightheadedness beginning today which prompted the visit.  Pt reports these symptoms are present only when his headaches are.  Pain begins gradually and worsens quickly, located in the right parietal region.  No aggravating factors.  Pt reports he has seen ENT without findings for his complaint and has been referred to neurology.He reports ice/heat and topomax without relief.  He is also taking a prednisone taper.    Pt denies fever, chills, neck pain, neck stiffness, CP, SOB, abd pain, N/V/D, weakness, dizziness, syncope.   MRI results reviewed no acute findings.  Patient denies neuro symptoms except for hearing loss which she's had for 3 weeks.  Pt is without headache at this time.     Past Medical History  Diagnosis Date  . Traumatic rupture of left Achilles tendon   . Mild asthma   . OSA on CPAP     per study  05-04-2013 mild osa  . Wears contact lenses   . Hearing loss of right ear    Past Surgical History  Procedure Laterality Date  . Achilles tendon repair Left 2014    and removal spur  . Tonsillectomy  age 38  . Cardiovascular stress  test  05-25-2011   DR Jacinto HalimGANJI    NORMAL PERFUSION/  NORMAL EF  . Achilles tendon surgery Left 09/04/2013    Procedure: LEFT ACHILLES TENDON REPAIR ;  Surgeon: Ludger NuttingN'Tuma Jah, MD;  [facility]  [facility];  Service: Podiatry;  Laterality: Left;   Family History  Problem Relation Age of Onset  . Asthma Mother    History  Substance Use Topics  . Smoking status: Former Smoker -- 1.00 packs/day for 7 years    Types: Cigarettes    Quit date: 06/03/2008  . Smokeless tobacco: Never Used  . Alcohol Use: No    Review of Systems  Constitutional: Negative for fever, diaphoresis, appetite change, fatigue and unexpected weight change.  HENT: Negative for mouth sores.   Eyes: Positive for pain. Negative for visual disturbance.  Respiratory: Negative for cough, chest tightness, shortness of breath and wheezing.   Cardiovascular: Negative for chest pain.  Gastrointestinal: Negative for nausea, vomiting, abdominal pain, diarrhea and constipation.  Endocrine: Negative for polydipsia, polyphagia and polyuria.  Genitourinary: Negative for dysuria, urgency, frequency and hematuria.  Musculoskeletal: Negative for back pain and neck stiffness.  Skin: Negative for rash.  Allergic/Immunologic: Negative for immunocompromised state.  Neurological: Positive for light-headedness and headaches. Negative for syncope.  Hematological: Does not bruise/bleed easily.  Psychiatric/Behavioral: Negative for sleep disturbance. The patient is not nervous/anxious.       Allergies  Review of patient's allergies indicates no known allergies.  Home Medications   Prior to Admission medications   Medication Sig Start Date End Date Taking? Authorizing Provider  albuterol (PROVENTIL HFA;VENTOLIN HFA) 108 (90 BASE) MCG/ACT inhaler Inhale 1-2 puffs into the lungs every 6 (six) hours as needed for wheezing or shortness of breath.   Yes Historical Provider, MD  aspirin EC 81 MG tablet Take 162 mg by mouth at bedtime.     Yes Historical Provider, MD  ibuprofen (ADVIL,MOTRIN) 800 MG tablet Take 800 mg by mouth every 6 (six) hours as needed for moderate pain.  08/26/13  Yes Cathlyn Parsons, NP  Multiple Vitamin (MULTIVITAMIN WITH MINERALS) TABS tablet Take 1 tablet by mouth daily.   Yes Historical Provider, MD  Omega-3 Fatty Acids (FISH OIL) 1000 MG CAPS Take 1 capsule by mouth daily.   Yes Historical Provider, MD  predniSONE (DELTASONE) 20 MG tablet Take 10 mg by mouth daily with breakfast.  01/12/14  Yes Historical Provider, MD  topiramate (TOPAMAX) 25 MG tablet Take 25 mg by mouth See admin instructions. 25 mg daily for 7 days then increase to 50 mg daily until next visit   Yes Historical Provider, MD   BP 145/83  Pulse 73  Temp(Src) 97.7 F (36.5 C) (Oral)  Resp 18  SpO2 99% Physical Exam  Nursing note and vitals reviewed. Constitutional: He is oriented to person, place, and time. He appears well-developed and well-nourished. No distress.  HENT:  Head: Normocephalic and atraumatic.  Right Ear: Tympanic membrane, external ear and ear canal normal.  Left Ear: Tympanic membrane, external ear and ear canal normal.  Nose: Nose normal. No mucosal edema or rhinorrhea. No epistaxis. Right sinus exhibits no maxillary sinus tenderness and no frontal sinus tenderness. Left sinus exhibits no maxillary sinus tenderness and no frontal sinus tenderness.  Mouth/Throat: Uvula is midline, oropharynx is clear and moist and mucous membranes are normal. Mucous membranes are not pale and not cyanotic. No uvula swelling. No oropharyngeal exudate, posterior oropharyngeal edema, posterior oropharyngeal erythema or tonsillar abscesses.  Mild right TMJ tenderness without swelling deformity or mass  Eyes: Conjunctivae, EOM and lids are normal. Pupils are equal, round, and reactive to light. Lids are everted and swept, no foreign bodies found. Right eye exhibits no chemosis, no discharge and no exudate. No foreign body present in the right  eye. Left eye exhibits no chemosis, no discharge and no exudate. No foreign body present in the left eye. Right conjunctiva is not injected. Right conjunctiva has no hemorrhage. Left conjunctiva is not injected. Left conjunctiva has no hemorrhage. No scleral icterus.  Slit lamp exam:      The right eye shows no corneal abrasion, no corneal flare, no corneal ulcer, no foreign body, no fluorescein uptake and no anterior chamber bulge.       The left eye shows no corneal abrasion, no corneal flare, no corneal ulcer, no foreign body, no fluorescein uptake and no anterior chamber bulge.  Pupils equal round and reactive to light No vertical, horizontal or rotational nystagmus No visible foreign body No herpetic lesions to the face or around the eye  Tono:  R - 22  L - 19   Neck: Normal range of motion and full passive range of motion without pain. Neck supple.  Full active and passive ROM without pain No midline or paraspinal tenderness No nuchal rigidity or meningeal signs  Cardiovascular: Normal rate, regular rhythm, normal heart sounds and intact distal pulses.   No murmur heard. Pulmonary/Chest: Effort normal and breath  sounds normal. No stridor. No respiratory distress. He has no wheezes. He has no rales.  Clear and equal breath sounds without focal wheezes, rhonchi, rales  Abdominal: Soft. Bowel sounds are normal. There is no tenderness. There is no rebound and no guarding.  Musculoskeletal: Normal range of motion.  Lymphadenopathy:    He has no cervical adenopathy.  Neurological: He is alert and oriented to person, place, and time. He has normal reflexes. No cranial nerve deficit. He exhibits normal muscle tone. Coordination normal.  Mental Status:  Alert, oriented, thought content appropriate. Speech fluent without evidence of aphasia. Able to follow 2 step commands without difficulty.  Cranial Nerves:  II:  Peripheral visual fields grossly normal, pupils equal, round, reactive to  light III,IV, VI: ptosis not present, extra-ocular motions intact bilaterally  V,VII: smile symmetric, facial light touch sensation equal VIII: hearing grossly normal in the left and decreased in the right  IX,X: gag reflex present  XI: bilateral shoulder shrug equal and strong XII: midline tongue extension  Motor:  5/5 in upper and lower extremities bilaterally including strong and equal grip strength and dorsiflexion/plantar flexion except 2/5 strength LLE with dorsiflexion/plantar flexion 2/2 poor effort from pain.   Sensory: Pinprick and light touch normal in all extremities.  Deep Tendon Reflexes: 2+ and symmetric  Cerebellar: normal finger-to-nose with bilateral upper extremities Gait: normal gait and balance CV: distal pulses palpable throughout   Skin: Skin is warm and dry. No rash noted. He is not diaphoretic. No erythema.  Psychiatric: He has a normal mood and affect. His behavior is normal. Judgment and thought content normal.    ED Course  Procedures (including critical care time) Labs Review Labs Reviewed - No data to display  Imaging Review No results found.   EKG Interpretation None      MDM   Final diagnoses:  Other headache syndrome  Intermittent lightheadedness   Zachary RunnerRicky Dullea presents with changes to his headache today including pain in his bilateral eyes and lightheadedness.  Patient without headache at this time.  It is not associated with lacrimation, rhinorrhea or diaphoresis of the face. Eye pain is bilateral and not unilateral. No injection of the eye during this time. I doubt cluster headache.    Distribution is normal MRI the last week and multiple workups for these headaches. Patient is normal neurologic exam and no changes today. Will assess the patient I to ensure no acute angle glaucoma, give migraine medication fluids and reassess.  Patient now complaining of beginnings of headache. No blurry vision, diplopia.  No twitching or focal motor  deficits.  No evidence of focal seizures.  12:35 AM Complete resolution of headache at this time. Patient reports he feels normal and his lightheadedness has resolved.  Patient without increased intraocular pressures to suggest acute angle glaucoma. [facility]  I have personally reviewed patient's vitals, nursing note and any pertinent labs or imaging.  I performed an undressed physical exam.    It has been determined that no acute conditions requiring further emergency intervention are present at this time. The patient/guardian have been advised of the diagnosis and plan. I reviewed all labs and imaging including any potential incidental findings. We have discussed signs and symptoms that warrant return to the ED, such as continued change in headache, focal neurologic deficits or other concerning symptoms.  Patient/guardian has voiced understanding and agreed to follow-up with the PCP or specialist as soon as possible.  Vital signs  are stable at discharge.   BP 145/83  Pulse 73  Temp(Src) 97.7 F (36.5 C) (Oral)  Resp 18  SpO2 99%        Dierdre Forth, PA-C 02/04/14 0037

## 2014-02-03 NOTE — ED Notes (Addendum)
Pt c/o 6-8 headaches per day since 9/30.  Pain score 8/10.  Pt reports that he now is becoming dizzy and fatigued after the headaches.  [facility] and seen by an ENT MD.  Pt has been referred to a neurologist, but has not been seen yet.

## 2014-02-04 NOTE — ED Provider Notes (Signed)
Medical screening examination/treatment/procedure(s) were performed by non-physician practitioner and as supervising physician I was immediately available for consultation/collaboration.   EKG Interpretation None        Richardean Canalavid H Lashae Wollenberg, MD 02/04/14 1510

## 2014-02-04 NOTE — Discharge Instructions (Signed)
1. Medications: usual home medications 2. Treatment: rest, drink plenty of fluids,  3. Follow Up: Please followup with your primary doctor and [facility] for discussion of your diagnoses and further evaluation after today's visit; if you do not have a primary care doctor use the resource guide provided to find one;    Emergency Department Resource Guide 1) Find a Doctor and Pay Out of Pocket Although you won't have to find out who is covered by your insurance plan, it is a good idea to ask around and get recommendations. You will then need to call the office and see if the doctor you have chosen will accept you as a new patient and what types of options they offer for patients who are self-pay. Some doctors offer discounts or will set up payment plans for their patients who do not have insurance, but you will need to ask so you aren't surprised when you get to your appointment.  2) Contact Your Local Health Department Not all health departments have doctors that can see patients for sick visits, but many do, so it is worth a call to see if yours does. If you don't know where your local health department is, you can check in your phone book. The CDC also has a tool to help you locate your state's health department, and many state websites also have listings of all of their local health departments.  3) Find a Walk-in Clinic If your illness is not likely to be very severe or complicated, you may want to try a walk in clinic. These are popping up all over the country in pharmacies, drugstores, and shopping centers. They're usually staffed by nurse practitioners or physician assistants that have been trained to treat common illnesses and complaints. They're usually fairly quick and inexpensive. However, if you have serious medical issues or chronic medical problems, these are probably not your best option.  No Primary Care Doctor: - Call Health Connect at  [phone] - they can help you locate a  primary care doctor that  accepts your insurance, provides certain services, etc. - Physician Referral Service- [phone]  Chronic Pain Problems: Organization         Address  Phone   Notes  [facility]  [phone]) [phone] Patients need to be referred by their primary care doctor.   Medication Assistance: Organization         Address  Phone   Notes  [facility] [address] [place], [place] [address] [phone]) [phone] --Must be a resident of [facility] [place] -- Must have NO insurance coverage whatsoever (no Medicaid/ Medicare, etc.) -- The pt. MUST have a primary care doctor that directs their care regularly and follows them in the community   MedAssist  [phone]   [employer]  [phone]) [phone]    Agencies that provide inexpensive medical care: Organization         Address  Phone   Notes  [facility]  [phone]) [phone]   [facility]    [phone]) [phone]   [facility] [address] [place], [place] [address] [phone]) [phone]   [facility] [address], [place] [phone]) [phone]   [facility]anned Parenthood    [phone]) [phone]   [facility]    [phone]) [phone]   [facility] and [facility]  [address], [place] Phone:  [phone]) [phone], Fax:  [phone] Hours  of Operation:  9 am - 6 pm, M-F.  Also accepts Medicaid/Medicare and self-pay.  [facility] for [place] [place], [address], [place] Phone: [phone], Fax: [phone]. Hours of Operation:  8:30 am - 5:30 pm, M-F.  Also accepts Medicaid and self-pay.  [facility] [place] [address] [place] Phone: [phone]   [place], [place], [place] [phone], Ext. [phone] Mondays & Thursdays: 7-9 AM.  First 15 patients are seen on a first come, first serve basis.    [place]  Providers:  Organization         Address  Phone   Notes  [facility] [address], [place] [phone] Also accepts self-pay patients.  [facility] [address], [place]  [phone]   [place], [address], [place] [phone]   [facility] [address] [place] [phone]   Lucianne Lei [address], [place]   [phone] Only accepts [place] Access [place] patients after they have their name applied to their card.   Self-Pay (no insurance) in [facility]:  Organization         Address  Phone   Notes  Sickle Cell Patients, [facility] [place] [phone]   [facility] [place] [phone]   [facility] [place]  [place], [place], [place] [phone]   [facility]/Dr. Osei-Bonsu  [address] [place] or [address], [place] [phone] Phone number for both [place] and [place] locations is the same.  [facility] [address], [place] [phone]   [facility] [address] [place] or [address] [phone]   [facility] [address], [place] [phone], phone; [phone], fax Sees patients 1st and 3rd Saturday of every month.  Must not qualify for public or private insurance (i.e. Medicaid, Medicare, [place] Health Choice, Veterans' Benefits)  Household income should be no more than 200% of the poverty level The clinic cannot treat you if you are pregnant or think you are pregnant  Sexually transmitted diseases are not treated at the clinic.    Dental Care: Organization         Address  Phone  Notes  [facility] [place] [phone] Accepts children up to age 4 who are enrolled in [place] or [place]; pregnant women with a Medicaid card; and children who have applied for Medicaid or [place] Health Choice, but were declined, whose parents can pay a reduced fee at time of service.  [facility] Department of [facility]  [address], [place] [phone] Accepts children up to age 85 who are enrolled in [place] or [place]; pregnant women with a Medicaid card; and children who have applied for Medicaid or [place] Health Choice, but were declined, whose parents can pay a reduced fee at time of service.  [facility]  [place] [phone] Patients are seen by appointment only. Walk-ins are not accepted. [place] will see  patients 58 years of age and older. Monday - Tuesday (8am-5pm) Most Wednesdays (8:30-5pm) $30 per visit, cash only  [facility] Adult Dental Access PROGRAM  [address], [facility] [phone] Patients are seen by appointment only. Walk-ins are not accepted. [place] will see patients 67 years of age and older. One Wednesday Evening (Monthly: Volunteer Based).  $30 per visit, cash only  [place]  [phone] for adults; Children under age 67, call Graduate Pediatric Dentistry at [phone]. Children aged 34-14, please call [phone] to request a pediatric application.  Dental services are provided in all areas of dental care including fillings, crowns and bridges, complete and partial dentures, implants, gum treatment, root canals, and extractions. Preventive care is also provided. Treatment is provided to both adults and children. Patients are selected via a lottery and there is often a waiting list.   [facility] [address], [place]  [phone] [email or web address]   [facility]  [address] [place], [place] [phone], Ext. [phone] Second and Fourth Thursday of each month, opens at 6:30 AM; Clinic ends at 9 AM.  Patients are seen on a first-come first-served basis, and a limited number are seen during each clinic.   [facility]  [address] Hillard Danker [place], [place] [phone]   Eligibility Requirements You must have lived in [place], [place], or [place] counties for at least the last three months.   You cannot be eligible for state or federal sponsored [employer], including [employer], [place], or [employer].   You generally cannot be eligible for healthcare insurance through your employer.    How to apply: Eligibility screenings are held every Tuesday and Wednesday afternoon from 1:00 pm until 4:00 pm. You do not need an appointment for the interview!  [facility] [address] [place], [place]   [place]  [place] Department  [place]  [phone]    Behavioral Health Resources in the Community: Intensive Outpatient Programs Organization         Address  Phone  Notes  [place]. [address] [place], [place] [phone]   [facility] [address], [place], [place]   ADS: Alcohol & Drug Svcs [address] [place], [place]   [place] [address]  [place], [place] or [phone]   Substance Abuse Resources Organization         Address  Phone  Notes  Alcohol and Drug Services  [phone]   [place]  [phone]   [facility] [place]   Chinita Pester  [phone]   Residential & Outpatient Substance Abuse Program  [phone]   Psychological Services Organization         Address  Phone  Notes  [facility]  [place]  [phone]   [place] [address] [place] or [phone]    Mobile Crisis Teams Organization         Address  Phone  Notes  [facility], Mobile Crisis Care Unit  [phone]   [facility] Psychotherapeutic [facility]  [address] [place], [place]   [facility] [address] [place] [phone]    Self-Help/Support Groups Organization  Address  Phone             Notes  [facility]ental Health Assoc[facility] of [place] - variety of support groups  [phone]- [phone] Call for more information  Narcotics Anonymous (NA), [facility] [address], [place]  2 meetings at this location   Statisticianesidential Treatment Programs Organization         Address  Phone  Notes  [facility] [address],    [place]  [phone]   [facility]  [address], [place] [phone], [place], [place] [phone]   [facility] [address], [place] [phone] Admissions: 8am-3pm M-F  [facility] [address]    [place], [place] [phone]   [facility] [address], [place], [place] [phone]   [facility] [address]  [place], [place] [phone]   [facility] - Intensive Outpatient [address], [place], [place] [phone]   [facility] [address]  [place], [place] [phone] or [phone]   [facility] Services (RTS) [address] [place], [place] [phone] Accepts Medicaid  [facility] [address]  [place] [phone] Substance Abuse/Addiction Treatment   [facility] Behavioral Health Resources Organization         Address  Phone  Notes  [facility]  [phone](888) [phone]   Angie FavaJulie Brannon, PhD [address] [place], [place]   [phone]) [phone] or [phone]) [phone]    [facility]   [address] [place], [place] [phone](336) [phone]   [facility] [address], [place], [place] [phone]) [phone] Insurance/Medicaid/sponsorship through [facility]  [facility] [address]                                    [place], [place] [phone]) [phone] Therapy/tele-psych/case  [facility] [address].   [place], [place] [phone](336) [phone]    Dr. Lolly MustacheArfeen  [phone](336) [phone]   [facility]  United Way [facility] 1) [address], [place] 2) [address], [place] 3)  [address] [address], [place] [phone]) [phone](336) [phone]  [phone](336) [phone]   [facility] Child Abuse Hotline [phone](336) [phone] or [phone]) [phone] (After Hours)

## 2014-02-05 ENCOUNTER — Encounter: Payer: Self-pay | Admitting: Family Medicine

## 2014-02-06 ENCOUNTER — Ambulatory Visit (INDEPENDENT_AMBULATORY_CARE_PROVIDER_SITE_OTHER): Payer: BC Managed Care – PPO | Admitting: Physician Assistant

## 2014-02-06 VITALS — BP 114/80 | HR 88 | Temp 97.9°F | Resp 18 | Ht 69.0 in | Wt 200.8 lb

## 2014-02-06 DIAGNOSIS — G4733 Obstructive sleep apnea (adult) (pediatric): Secondary | ICD-10-CM

## 2014-02-06 DIAGNOSIS — J4520 Mild intermittent asthma, uncomplicated: Secondary | ICD-10-CM

## 2014-02-06 DIAGNOSIS — S86012A Strain of left Achilles tendon, initial encounter: Secondary | ICD-10-CM | POA: Insufficient documentation

## 2014-02-06 DIAGNOSIS — J45909 Unspecified asthma, uncomplicated: Secondary | ICD-10-CM | POA: Insufficient documentation

## 2014-02-06 DIAGNOSIS — L739 Follicular disorder, unspecified: Secondary | ICD-10-CM

## 2014-02-06 DIAGNOSIS — Z9989 Dependence on other enabling machines and devices: Secondary | ICD-10-CM

## 2014-02-06 DIAGNOSIS — S86012S Strain of left Achilles tendon, sequela: Secondary | ICD-10-CM

## 2014-02-06 MED ORDER — DOXYCYCLINE HYCLATE 100 MG PO CAPS: 100.0000 mg | ORAL_CAPSULE | Freq: Two times a day (BID) | ORAL | Status: DC

## 2014-02-06 MED ORDER — CHLORHEXIDINE GLUCONATE 4 % EX LIQD: Freq: Every day | CUTANEOUS | Status: DC | PRN

## 2014-02-06 NOTE — Progress Notes (Signed)
I was directly involved with the patient's care and agree with the physical, diagnosis and treatment plan.  

## 2014-02-06 NOTE — Patient Instructions (Signed)
Please take the doxycycline twice daily for 10 days. Please wash with the hibiclens 1-2 times per week. Please return to clinic if rash does not improve in one week, or if it worsens.  Folliculitis  Folliculitis is redness, soreness, and swelling (inflammation) of the hair follicles. This condition can occur anywhere on the body. People with weakened immune systems, diabetes, or obesity have a greater risk of getting folliculitis. CAUSES  Bacterial infection. This is the most common cause.  Fungal infection.  Viral infection.  Contact with certain chemicals, especially oils and tars. Long-term folliculitis can result from bacteria that live in the nostrils. The bacteria may trigger multiple outbreaks of folliculitis over time. SYMPTOMS Folliculitis most commonly occurs on the scalp, thighs, legs, back, buttocks, and areas where hair is shaved frequently. An early sign of folliculitis is a small, white or yellow, pus-filled, itchy lesion (pustule). These lesions appear on a red, inflamed follicle. They are usually less than 0.2 inches (5 mm) wide. When there is an infection of the follicle that goes deeper, it becomes a boil or furuncle. A group of closely packed boils creates a larger lesion (carbuncle). Carbuncles tend to occur in hairy, sweaty areas of the body. DIAGNOSIS  Your caregiver can usually tell what is wrong by doing a physical exam. A sample may be taken from one of the lesions and tested in a lab. This can help determine what is causing your folliculitis. TREATMENT  Treatment may include:  Applying warm compresses to the affected areas.  Taking antibiotic medicines orally or applying them to the skin.  Draining the lesions if they contain a large amount of pus or fluid.  Laser hair removal for cases of long-lasting folliculitis. This helps to prevent regrowth of the hair. HOME CARE INSTRUCTIONS  Apply warm compresses to the affected areas as directed by your  caregiver.  If antibiotics are prescribed, take them as directed. Finish them even if you start to feel better.  You may take over-the-counter medicines to relieve itching.  Do not shave irritated skin.  Follow up with your caregiver as directed. SEEK IMMEDIATE MEDICAL CARE IF:   You have increasing redness, swelling, or pain in the affected area.  You have a fever. MAKE SURE YOU:  Understand these instructions.  Will watch your condition.  Will get help right away if you are not doing well or get worse. Document Released: 06/11/2001 Document Revised: 10/02/2011 Document Reviewed: 07/03/2011 [facility] Patient Information 2015 [place], [place]. This information is not intended to replace advice given to you by your health care provider. Make sure you discuss any questions you have with your health care provider.

## 2014-02-06 NOTE — Progress Notes (Signed)
Subjective:    Patient ID: Zachary Grant, male    DOB: Dec 20, 1975, 38 y.o.   MRN: [id number]  No PCP Per Patient  Chief Complaint  Patient presents with  . Rash    Redden and painful areas on back. Started a few days ago. Pt's ENT told him he might have shingles.    There are no active problems to display for this patient.  Prior to Admission medications   Medication Sig Start Date End Date Taking? Authorizing Provider  albuterol (PROVENTIL HFA;VENTOLIN HFA) 108 (90 BASE) MCG/ACT inhaler Inhale 1-2 puffs into the lungs every 6 (six) hours as needed for wheezing or shortness of breath.   Yes Historical Provider, MD  aspirin EC 81 MG tablet Take 162 mg by mouth at bedtime.    Yes Historical Provider, MD  ibuprofen (ADVIL,MOTRIN) 800 MG tablet Take 800 mg by mouth every 6 (six) hours as needed for moderate pain.  08/26/13  Yes Cathlyn ParsonsAngela M Kabbe, NP  Multiple Vitamin (MULTIVITAMIN WITH MINERALS) TABS tablet Take 1 tablet by mouth daily.   Yes Historical Provider, MD  Omega-3 Fatty Acids (FISH OIL) 1000 MG CAPS Take 1 capsule by mouth daily.   Yes Historical Provider, MD  predniSONE (DELTASONE) 20 MG tablet Take 10 mg by mouth daily with breakfast.  01/12/14  Yes Historical Provider, MD  topiramate (TOPAMAX) 25 MG tablet Take 25 mg by mouth See admin instructions. 25 mg daily for 7 days then increase to 50 mg daily until next visit   Yes Historical Provider, MD  chlorhexidine (HIBICLENS) 4 % external liquid Apply topically daily as needed. 02/06/14   Raelyn Ensignodd Lena Fieldhouse, PA  doxycycline (VIBRAMYCIN) 100 MG capsule Take 1 capsule (100 mg total) by mouth 2 (two) times daily. 02/06/14   Raelyn Ensignodd Brigitte Soderberg, PA   Medications, allergies, past medical history, surgical history, family history, social history and problem list reviewed and updated.  HPI  [age] yom with PMH OSA, intermittent asthma, and ruptured achilles tendon 08/2013 presents with rash. Back in early Oct 2015 he had a sudden onset hearing loss left ear.  He has been worked up by ENT with [facility] and no cause has been found. ENT started him on a prednisone taper on Oct 2nd, starting with 80mg  daily and he is currently down to 10mg  daily. He also has been experiencing bilateral HAs for the past few weeks which he has been to the ED several times for. His ENT is not able to figure out what is triggering these HAs and he has an appt to see Dr Lesia SagoKeith Willis (neurologist) early next week.   He is here today for evaluation of his rash. He first noticed red bumps along his anterior neck two weeks ago, about one week after he started his prednisone. He has tried antibacterial soap on the area which has not helped. For the past week the neck irritation has continued and he has also noticed bumps across his back and on his chest. His back has been itchy and burning a bit in the right lower area. Otherwise none of the areas are causing him discomfort.   He denies any new lotions, detergents, , razors, soaps (other than antibacterial soap) or meds (other than prednisone). He sleeps in bed with his wife, and lives with her grandparents. None of them have similar rash. He has never had a breakout like this before.   Review of Systems No chest pain, SOB, dizziness, vision change, N/V, diarrhea, constipation, dysuria, urinary urgency or  frequency, myalgias, or arthralgias.      Objective:   Physical Exam  Constitutional: He is oriented to person, place, and time. He appears well-developed and well-nourished.  Non-toxic appearance. He does not have a sickly appearance. He does not appear ill.  BP 114/80  Pulse 88  Temp(Src) 97.9 F (36.6 C) (Oral)  Resp 18  Ht 5\' 9"  (1.753 m)  Wt 200 lb 12.8 oz (91.082 kg)  BMI 29.64 kg/m2  SpO2 99%   Neurological: He is alert and oriented to person, place, and time.  Skin: Skin is warm and dry.  Numerous papules with some pustules across back extending from neckline down to buttocks bilaterally. Several papules extending  onto posterior scalp. Several papules with one pustule across chest. Numerous papules across anterior neck. No surrounding erythema. No scaling of lesions. No drainage. Lesions mostly assoc with hair-bearing areas.   Psychiatric: He has a normal mood and affect. His speech is normal.      Assessment & Plan:   38 yom who has been on oral prednisone taper for 3 weeks presents with numerous papules/pustules across body.   Folliculitis - Plan: doxycycline (VIBRAMYCIN) 100 MG capsule, chlorhexidine (HIBICLENS) 4 % external liquid --Rash most likely folliculitis secondary to immunocompromised state with prednisone past 3 weeks. Rash started one week after prednisone started.  --Rash not fungal in appearance. History doesn't suggest scabies. No new exposures to suggest irritant/allergic dermatitis.  --Doxy 10 days and hibiclens 1-2 times weekly.  --RTC in one week if rash not resolving, or sooner if rash worsens.   Donnajean Lopesodd M. Jora Galluzzo, PA-C Physician Assistant-Certified [facility]  02/06/2014 11:56 AM

## 2014-02-07 NOTE — Telephone Encounter (Signed)
Pt was seen in office 02/06/14

## 2014-02-10 ENCOUNTER — Ambulatory Visit (INDEPENDENT_AMBULATORY_CARE_PROVIDER_SITE_OTHER): Payer: BC Managed Care – PPO | Admitting: Neurology

## 2014-02-10 ENCOUNTER — Encounter: Payer: Self-pay | Admitting: Neurology

## 2014-02-10 VITALS — BP 129/87 | HR 97 | Ht 68.0 in | Wt 201.2 lb

## 2014-02-10 DIAGNOSIS — R519 Headache, unspecified: Secondary | ICD-10-CM

## 2014-02-10 DIAGNOSIS — R51 Headache: Secondary | ICD-10-CM

## 2014-02-10 HISTORY — DX: Headache, unspecified: R51.9

## 2014-02-10 MED ORDER — INDOMETHACIN 50 MG PO CAPS: 50.0000 mg | ORAL_CAPSULE | Freq: Two times a day (BID) | ORAL | Status: DC

## 2014-02-10 NOTE — Patient Instructions (Signed)

## 2014-02-10 NOTE — Progress Notes (Signed)
Reason for visit: Headache  Zachary Grant is a 38 y.o. male  History of present illness:  Zachary Grant is a 38 year old right-handed white male with a history of headache that began around 01/14/2014. The patient indicates that he generally does not have headache, but he may have a very occasional headache event once or twice a year that responds to Advil or Tylenol. The patient indicates that he awakened around 01/14/2014 with a hearing loss involving the right ear. The patient then began having frequent headaches that will occur several times during the day, oftentimes worse in the afternoon or evening. The headache does not wake him up at night. The headaches are associated with a constant boring pressure discomfort on the right temporal and parietal area, and the headaches may be retro-orbital in nature. The headaches may last about 20 minutes, and then go away and be associated with a brief generalized discomfort that will respond to Advil or Tylenol. The patient denies any nausea or vomiting, hearing changes with the headache, but he may have some slight lightheaded sensations, not true vertigo. He reports no numbness or weakness on the face, arms, or legs. He has not had any double vision, slurred speech, or problems with swallowing. He does report some neck discomfort at times. The patient indicates that he has no problems with confusion, or balance issues. He has no photophobia or phonophobia with the headache. The patient indicates that he has never had headaches such as this previously. The patient has been placed on prednisone without much benefit with the headache. Topamax is being started, he currently is on 50 mg daily. The patient has been seen by Dr. 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 The patient is sent to this office for further evaluation. The patient reports no fevers or  chills. He developed a rash within the last several days after being on prednisone 20 mg daily for a week or so. The patient was felt to have folliculitis associated with the use of prednisone. The rash is mainly on the back, with some rash on the chest. He has been placed on doxycycline for the rash.  Past Medical History  Diagnosis Date  . Traumatic rupture of left Achilles tendon   . Mild asthma   . OSA on CPAP     per study  05-04-2013 mild osa  . Wears contact lenses   . Hearing loss of right ear     Being worked up by ENT 01/2014  . Bilateral headaches     Being worked up by neurology - 01/2014  . Headache disorder 02/10/2014    Past Surgical History  Procedure Laterality Date  . Achilles tendon repair Left 2014    and removal spur  . Tonsillectomy  age 38  . Cardiovascular stress test  05-25-2011   DR Jacinto HalimGANJI    NORMAL PERFUSION/  NORMAL EF  . Achilles tendon surgery Left 09/04/2013    Procedure: LEFT ACHILLES TENDON REPAIR ;  Surgeon: Ludger NuttingN'Tuma Jah, MD;  Location: [facility];  Service: Podiatry;  Laterality: Left;    Family History  Problem Relation Age of Onset  . Asthma Mother   . Depression Brother     Social history:  reports that he quit smoking about 5 years ago. His smoking use included Cigarettes. He has a 7 pack-year smoking history. He has never used smokeless  tobacco. He reports that he does not drink alcohol or use illicit drugs.  Medications:  Current Outpatient Prescriptions on File Prior to Visit  Medication Sig Dispense Refill  . albuterol (PROVENTIL HFA;VENTOLIN HFA) 108 (90 BASE) MCG/ACT inhaler Inhale 1-2 puffs into the lungs every 6 (six) hours as needed for wheezing or shortness of breath.      Marland Kitchen aspirin EC 81 MG tablet Take 162 mg by mouth at bedtime.       . chlorhexidine (HIBICLENS) 4 % external liquid Apply topically daily as needed.  120 mL  0  . doxycycline (VIBRAMYCIN) 100 MG capsule Take 1 capsule (100 mg total) by mouth 2 (two)  times daily.  20 capsule  0  . ibuprofen (ADVIL,MOTRIN) 800 MG tablet Take 800 mg by mouth every 6 (six) hours as needed for moderate pain.       . Multiple Vitamin (MULTIVITAMIN WITH MINERALS) TABS tablet Take 1 tablet by mouth daily.      . Omega-3 Fatty Acids (FISH OIL) 1000 MG CAPS Take 1 capsule by mouth daily.      . predniSONE (DELTASONE) 20 MG tablet Take 10 mg by mouth daily with breakfast.       . topiramate (TOPAMAX) 25 MG tablet Take 50 mg by mouth daily.        No current facility-administered medications on file prior to visit.     No Known Allergies  ROS:  Out of a complete 14 system review of symptoms, the patient complains only of the following symptoms, and all other reviewed systems are negative.  Fatigue Hearing loss, ringing in the ears, dizziness Eye pain Flushing Headache, dizziness Snoring   Blood pressure 129/87, pulse 97, height 5\' 8"  (1.727 m), weight 201 lb 3.2 oz (91.264 kg).  Physical Exam  General: The patient is alert and cooperative at the time of the examination.  Eyes: Pupils are equal, round, and reactive to light. Discs are flat bilaterally.  Neck: The neck is supple, no carotid bruits are noted.  Respiratory: The respiratory examination is clear.  Cardiovascular: The cardiovascular examination reveals a regular rate and rhythm, no obvious murmurs or rubs are noted.  Neuromuscular: Range of movement of the cervical spine is full. No crepitus is noted in the temporomandibular joints.  Skin: Extremities are without significant edema.  Neurologic Exam  Mental status: The patient is alert and oriented x 3 at the time of the examination. The patient has apparent normal recent and remote memory, with an apparently normal attention span and concentration ability.  Cranial nerves: Facial symmetry is present. There is good sensation of the face to pinprick and soft touch bilaterally. The strength of the facial muscles and the muscles to head  turning and shoulder shrug are normal bilaterally. Speech is well enunciated, no aphasia or dysarthria is noted. Extraocular movements are full. Visual fields are full. The tongue is midline, and the patient has symmetric elevation of the soft palate. No obvious hearing deficits are noted.  Motor: The motor testing reveals 5 over 5 strength of all 4 extremities. Good symmetric motor tone is noted throughout.  Sensory: Sensory testing is intact to pinprick, soft touch, vibration sensation, and position sense on all 4 extremities. No evidence of extinction is noted.  Coordination: Cerebellar testing reveals good finger-nose-finger and heel-to-shin bilaterally.  Gait and station: Gait is normal. Tandem gait is normal. Romberg is negative. No drift is seen.  Reflexes: Deep tendon reflexes are symmetric and normal bilaterally. Toes are  downgoing bilaterally.   MRI brain 01/21/14:  IMPRESSION:  No cause is seen for RIGHT-sided hearing loss. No acute intracranial  findings are evident.  6 mm area of non worrisome enhancement RIGHT thalamus, favor  capillary telangiectasia. See discussion above     Assessment/Plan:  1. Headache  2. Hearing loss, AD  The patient has an unusual headache syndrome that came on with hearing loss on the right side, with a constant pressure pain on the right side of the head that may come and go multiple times during the day. The headache syndrome is not consistent with migraine. The patient will be set up for further blood work looking for various etiologies of vasculitis, and to exclude sarcoidosis and Lyme disease. The patient will be set up for a carotid Doppler study to exclude the possibility of a carotid artery dissection. If the studies are unremarkable, lumbar puncture will be done. The patient will be placed on Indocin when the prednisone dosing has stopped. The patient will follow-up in 2 months. The patient does not have a primary care physician.   Marlan Palau.  Keith Leonard Feigel MD 02/10/2014 9:15 AM  [facility] [address] [place], [place] [id number]  Phone [phone] Fax [phone]

## 2014-02-11 ENCOUNTER — Encounter: Payer: Self-pay | Admitting: Neurology

## 2014-02-11 LAB — HIV ANTIBODY (ROUTINE TESTING W REFLEX)
HIV 1/O/2 Abs-Index Value: <1 (ref <1.00)
HIV-1/HIV-2 Ab: NONREACTIVE

## 2014-02-11 LAB — RHEUMATOID FACTOR: Rhuematoid fact SerPl-aCnc: 8.6 IU/mL (ref 0.0–13.9)

## 2014-02-11 LAB — ANTINEUTROPHIL CYTOPLASMIC AB: C-ANCA: <1:20 {titer}

## 2014-02-11 LAB — ANTIMYELOPEROXIDASE (MPO) ABS

## 2014-02-11 LAB — C-REACTIVE PROTEIN: CRP: 3.1 mg/L (ref 0.0–4.9)

## 2014-02-11 LAB — ANA W/REFLEX: Anti Nuclear Antibody(ANA): NEGATIVE

## 2014-02-11 LAB — ANGIOTENSIN CONVERTING ENZYME: Angio Convert Enzyme: 31 U/L (ref 14–82)

## 2014-02-11 LAB — LYME, TOTAL AB TEST/REFLEX: Lyme IgG/IgM Ab: <0.91 {ISR} (ref 0.00–0.90)

## 2014-02-15 ENCOUNTER — Encounter: Payer: Self-pay | Admitting: Neurology

## 2014-02-16 ENCOUNTER — Encounter (HOSPITAL_COMMUNITY): Payer: Self-pay

## 2014-02-16 ENCOUNTER — Encounter: Payer: Self-pay | Admitting: Neurology

## 2014-02-16 ENCOUNTER — Emergency Department (HOSPITAL_COMMUNITY)
Admission: EM | Admit: 2014-02-16 | Discharge: 2014-02-16 | Disposition: A | Discharge status: Home / Self Care | Payer: BC Managed Care – PPO | Attending: Emergency Medicine | Admitting: Emergency Medicine

## 2014-02-16 DIAGNOSIS — Z87828 Personal history of other (healed) physical injury and trauma: Secondary | ICD-10-CM | POA: Diagnosis not present

## 2014-02-16 DIAGNOSIS — G4733 Obstructive sleep apnea (adult) (pediatric): Secondary | ICD-10-CM | POA: Insufficient documentation

## 2014-02-16 DIAGNOSIS — Z792 Long term (current) use of antibiotics: Secondary | ICD-10-CM | POA: Insufficient documentation

## 2014-02-16 DIAGNOSIS — R519 Headache, unspecified: Secondary | ICD-10-CM

## 2014-02-16 DIAGNOSIS — R51 Headache: Secondary | ICD-10-CM | POA: Diagnosis not present

## 2014-02-16 DIAGNOSIS — H81399 Other peripheral vertigo, unspecified ear: Secondary | ICD-10-CM | POA: Insufficient documentation

## 2014-02-16 DIAGNOSIS — Z7982 Long term (current) use of aspirin: Secondary | ICD-10-CM | POA: Insufficient documentation

## 2014-02-16 DIAGNOSIS — J4520 Mild intermittent asthma, uncomplicated: Secondary | ICD-10-CM | POA: Insufficient documentation

## 2014-02-16 DIAGNOSIS — Z79899 Other long term (current) drug therapy: Secondary | ICD-10-CM | POA: Insufficient documentation

## 2014-02-16 DIAGNOSIS — Z87891 Personal history of nicotine dependence: Secondary | ICD-10-CM | POA: Insufficient documentation

## 2014-02-16 DIAGNOSIS — Z791 Long term (current) use of non-steroidal anti-inflammatories (NSAID): Secondary | ICD-10-CM | POA: Diagnosis not present

## 2014-02-16 DIAGNOSIS — Z7952 Long term (current) use of systemic steroids: Secondary | ICD-10-CM | POA: Insufficient documentation

## 2014-02-16 DIAGNOSIS — H9191 Unspecified hearing loss, right ear: Secondary | ICD-10-CM | POA: Diagnosis not present

## 2014-02-16 DIAGNOSIS — R42 Dizziness and giddiness: Secondary | ICD-10-CM

## 2014-02-16 LAB — CBC
HEMATOCRIT: 41.5 % (ref 39.0–52.0)
Hemoglobin: 14.2 g/dL (ref 13.0–17.0)
MCH: 29.5 pg (ref 26.0–34.0)
MCHC: 34.2 g/dL (ref 30.0–36.0)
MCV: 86.1 fL (ref 78.0–100.0)
Platelets: 208 10*3/uL (ref 150–400)
RBC: 4.82 MIL/uL (ref 4.22–5.81)
RDW: 12.7 % (ref 11.5–15.5)
WBC: 5.2 10*3/uL (ref 4.0–10.5)

## 2014-02-16 LAB — BASIC METABOLIC PANEL
ANION GAP: 14 (ref 5–15)
BUN: 17 mg/dL (ref 6–23)
CALCIUM: 9.4 mg/dL (ref 8.4–10.5)
CHLORIDE: 102 meq/L (ref 96–112)
CO2: 25 mEq/L (ref 19–32)
CREATININE: 1.18 mg/dL (ref 0.50–1.35)
GFR calc non Af Amer: 76 mL/min — ABNORMAL LOW (ref >90)
GFR, EST AFRICAN AMERICAN: 88 mL/min — AB (ref >90)
Glucose, Bld: 112 mg/dL — ABNORMAL HIGH (ref 70–99)
Potassium: 4 mEq/L (ref 3.7–5.3)
Sodium: 141 mEq/L (ref 137–147)

## 2014-02-16 LAB — I-STAT TROPONIN, ED: TROPONIN I, POC: 0.01 ng/mL (ref 0.00–0.08)

## 2014-02-16 NOTE — ED Provider Notes (Signed)
CSN: [id number]     Arrival date & time 02/16/14  1808 History   First MD Initiated Contact with Patient 02/16/14 2006     Chief Complaint  Patient presents with  . Dizziness     (Consider location/radiation/quality/duration/timing/severity/associated sxs/prior Treatment) HPI  The patient has been having  hearing loss and headaches for about 4 weeks now. He has been evaluated by both neurology and ENT. He reports that he came to the emergency department this evening because he has developed a newer symptom which is some lightheadedness. He reports to the headaches have been coming and going. He may get 7 or more fairly severe lancinating right-sided headaches over the course of the day. He describes them as crescendoing and then easing back off again. He reports he noticed today however as he is getting the pain fairly intensely he is feeling somewhat lightheaded. He has had no syncope or near syncope. No loss of vision occurs with these headaches. He does not experience photophobia with them. He reports he gets some degree of "almost" being nauseated but does not vomit. There is no motor or gait incoordination in association with these symptoms. He has been tried on multiple treatments at this point without any improvement. He is undergoing hearing evaluation and testing with ENT but there is no definitive diagnosis for the hearing loss component.  Past Medical History  Diagnosis Date  . Traumatic rupture of left Achilles tendon   . Mild asthma   . OSA on CPAP     per study  05-04-2013 mild osa  . Wears contact lenses   . Hearing loss of right ear     Being worked up by ENT 01/2014  . Bilateral headaches     Being worked up by neurology - 01/2014  . Headache disorder 02/10/2014   Past Surgical History  Procedure Laterality Date  . Achilles tendon repair Left 2014    and removal spur  . Tonsillectomy  age 38  . Cardiovascular stress test  05-25-2011   DR Jacinto HalimGANJI    NORMAL PERFUSION/   NORMAL EF  . Achilles tendon surgery Left 09/04/2013    Procedure: LEFT ACHILLES TENDON REPAIR ;  Surgeon: Ludger NuttingN'Tuma Jah, MD;  Location: [facility];  Service: Podiatry;  Laterality: Left;   Family History  Problem Relation Age of Onset  . Asthma Mother   . Depression Brother    History  Substance Use Topics  . Smoking status: Former Smoker -- 1.00 packs/day for 7 years    Types: Cigarettes    Quit date: 06/03/2008  . Smokeless tobacco: Never Used  . Alcohol Use: No    Review of Systems  10 Systems reviewed and are negative for acute change except as noted in the HPI.   Allergies  Review of patient's allergies indicates no known allergies.  Home Medications   Prior to Admission medications   Medication Sig Start Date End Date Taking? Authorizing Provider  albuterol (PROVENTIL HFA;VENTOLIN HFA) 108 (90 BASE) MCG/ACT inhaler Inhale 1-2 puffs into the lungs every 6 (six) hours as needed for wheezing or shortness of breath.   Yes Historical Provider, MD  aspirin EC 81 MG tablet Take 162 mg by mouth at bedtime.    Yes Historical Provider, MD  ibuprofen (ADVIL,MOTRIN) 800 MG tablet Take 800 mg by mouth every 6 (six) hours as needed for moderate pain.  08/26/13  Yes Cathlyn ParsonsAngela M Kabbe, NP  indomethacin (INDOCIN) 50 MG capsule Take 1 capsule (50 mg  total) by mouth 2 (two) times daily with a meal. 02/10/14  Yes York Spanielharles K Willis, MD  Multiple Vitamin (MULTIVITAMIN WITH MINERALS) TABS tablet Take 1 tablet by mouth daily.   Yes Historical Provider, MD  Omega-3 Fatty Acids (FISH OIL) 1000 MG CAPS Take 1 capsule by mouth daily.   Yes Historical Provider, MD  predniSONE (DELTASONE) 20 MG tablet Take 10 mg by mouth daily with breakfast.  01/12/14  Yes Historical Provider, MD  chlorhexidine (HIBICLENS) 4 % external liquid Apply topically daily as needed. 02/06/14   Raelyn Ensignodd McVeigh, PA  doxycycline (VIBRAMYCIN) 100 MG capsule Take 1 capsule (100 mg total) by mouth 2 (two) times daily. 02/06/14    Raelyn Ensignodd McVeigh, PA  topiramate (TOPAMAX) 25 MG tablet Take 50 mg by mouth daily.     Historical Provider, MD   BP 122/82 mmHg  Pulse 90  Temp(Src) 98.5 F (36.9 C) (Oral)  Resp 17  Ht 5\' 8"  (1.727 m)  Wt 200 lb (90.719 kg)  BMI 30.42 kg/m2  SpO2 96% Physical Exam  Constitutional: He is oriented to person, place, and time. He appears well-developed and well-nourished.  HENT:  Head: Normocephalic and atraumatic.  Bilateral TMs normal in appearance without erythema or bulging. Ear canals are clear. There are no lesions around the auricles. Oropharynx is normal with symmetric palate elevation, no erythema or exudate.  Eyes: EOM are normal. Pupils are equal, round, and reactive to light. Right eye exhibits no discharge. Left eye exhibits no discharge. No scleral icterus.  Neck: Neck supple.  No palpable mass or lesions. No meningismus.  Cardiovascular: Normal rate, regular rhythm, normal heart sounds and intact distal pulses.   Pulmonary/Chest: Effort normal and breath sounds normal.  Abdominal: Soft. Bowel sounds are normal. He exhibits no distension. There is no tenderness.  Musculoskeletal: Normal range of motion. He exhibits no edema.  Neurological: He is alert and oriented to person, place, and time. He has normal strength. Coordination normal. GCS eye subscore is 4. GCS verbal subscore is 5. GCS motor subscore is 6.  Cranial nerves II through XII are intact. Normal extraocular motions. Normal consensual light response. No nystagmus. Negative Romberg. Normal finger-nose examination. 5 out of 5 upper and lower motor strength.  Skin: Skin is warm, dry and intact.  Psychiatric: He has a normal mood and affect.    ED Course  Procedures (including critical care time) Labs Review Labs Reviewed  BASIC METABOLIC PANEL - Abnormal; Notable for the following:    Glucose, Bld 112 (*)    GFR calc non Af Amer 76 (*)    GFR calc Af Amer 88 (*)    All other components within normal limits  CBC   I-STAT TROPOININ, ED    Imaging Review No results found.   EKG Interpretation   Date/Time:  Tuesday February 16 2014 18:30:59 EST Ventricular Rate:  100 PR Interval:  120 QRS Duration: 78 QT Interval:  334 QTC Calculation: 430 R Axis:   47 Text Interpretation:  Normal sinus rhythm RSR' or QR pattern in V1  suggests right ventricular conduction delay Cannot rule out Anterior  infarct , age undetermined Abnormal ECG AGREE. NO CHANGE FROM OLD.  Confirmed by Donnald GarrePfeiffer, MD, Lebron ConnersMarcy [phone] on 02/16/2014 9:24:07 PM     The patient's case was reviewed with Dr. Amada JupiterKirkpatrick by phone. At this point he does not recommend other diagnostic tests. He does feel the patient is appropriate to follow up with ENT tomorrow as already arranged and contact his  neurologist for recheck. MDM   Final diagnoses:  Nonintractable episodic headache, unspecified headache type  Hearing loss, right  Episodic lightheadedness   Patient presents for further evaluation for an ongoing headache disorder with right sided hearing loss. He has had extensive workup and consultation to date. He is concerned due to some element of lightheadedness now in association with the other pre-existing symptoms. At this time the patient is well in appearance. He has a normal physical examination with no positive neurologic findings. He does not have a general he ill appearance. Diagnostic tests done in the emergency department have come back within normal limits. At this time the etiology of his symptoms remains unclear however there does not appear to be an acute neurosurgical problem nor an acute infectious etiology. I have discussed the case with neurology and at this point this continues to be appropriate for outpatient workup.    Arby Barrette, MD 02/16/14 2141

## 2014-02-16 NOTE — Discharge Instructions (Signed)
Dizziness °Dizziness is a common problem. It is a feeling of unsteadiness or light-headedness. You may feel like you are about to faint. Dizziness can lead to injury if you stumble or fall. A person of any age group can suffer from dizziness, but dizziness is more common in older adults. °CAUSES  °Dizziness can be caused by many different things, including: °· Middle ear problems. °· Standing for too long. °· Infections. °· An allergic reaction. °· Aging. °· An emotional response to something, such as the sight of blood. °· Side effects of medicines. °· Tiredness. °· Problems with circulation or blood pressure. °· Excessive use of alcohol or medicines, or illegal drug use. °· Breathing too fast (hyperventilation). °· An irregular heart rhythm (arrhythmia). °· A low red blood cell count (anemia). °· Pregnancy. °· Vomiting, diarrhea, fever, or other illnesses that cause body fluid loss (dehydration). °· Diseases or conditions such as Parkinson's disease, high blood pressure (hypertension), diabetes, and thyroid problems. °· Exposure to extreme heat. °DIAGNOSIS  °Your health care provider will ask about your symptoms, perform a physical exam, and perform an electrocardiogram (ECG) to record the electrical activity of your heart. Your health care provider may also perform other heart or blood tests to determine the cause of your dizziness. These may include: °· Transthoracic echocardiogram (TTE). During echocardiography, sound waves are used to evaluate how blood flows through your heart. °· Transesophageal echocardiogram (TEE). °· Cardiac monitoring. This allows your health care provider to monitor your heart rate and rhythm in real time. °· Holter monitor. This is a portable device that records your heartbeat and can help diagnose heart arrhythmias. It allows your health care provider to track your heart activity for several days if needed. °· Stress tests by exercise or by giving medicine that makes the heart beat  faster. °TREATMENT  °Treatment of dizziness depends on the cause of your symptoms and can vary greatly. °HOME CARE INSTRUCTIONS  °· Drink enough fluids to keep your urine clear or pale yellow. This is especially important in very hot weather. In older adults, it is also important in cold weather. °· Take your medicine exactly as directed if your dizziness is caused by medicines. When taking blood pressure medicines, it is especially important to get up slowly. °· Rise slowly from chairs and steady yourself until you feel okay. °· In the morning, first sit up on the side of the bed. When you feel okay, stand slowly while holding onto something until you know your balance is fine. °· Move your legs often if you need to stand in one place for a long time. Tighten and relax your muscles in your legs while standing. °· Have someone stay with you for 1-2 days if dizziness continues to be a problem. Do this until you feel you are well enough to stay alone. Have the person call your health care provider if he or she notices changes in you that are concerning. °· Do not drive or use heavy machinery if you feel dizzy. °· Do not drink alcohol. °SEEK IMMEDIATE MEDICAL CARE IF:  °· Your dizziness or light-headedness gets worse. °· You feel nauseous or vomit. °· You have problems talking, walking, or using your arms, hands, or legs. °· You feel weak. °· You are not thinking clearly or you have trouble forming sentences. It may take a friend or family member to notice this. °· You have chest pain, abdominal pain, shortness of breath, or sweating. °· Your vision changes. °· You notice   any bleeding. °· You have side effects from medicine that seems to be getting worse rather than better. °MAKE SURE YOU:  °· Understand these instructions. °· Will watch your condition. °· Will get help right away if you are not doing well or get worse. °Document Released: 09/26/2000 Document Revised: 04/07/2013 Document Reviewed: 10/20/2010 °ExitCare®  Patient Information ©2015 [employer]. This information is not intended to replace advice given to you by your health care provider. Make sure you discuss any questions you have with your health care provider. ° °General Headache Without Cause °A headache is pain or discomfort felt around the head or neck area. The specific cause of a headache may not be found. There are many causes and types of headaches. A few common ones are: °· Tension headaches. °· Migraine headaches. °· Cluster headaches. °· Chronic daily headaches. °HOME CARE INSTRUCTIONS  °· Keep all follow-up appointments with your caregiver or any specialist referral. °· Only take over-the-counter or prescription medicines for pain or discomfort as directed by your caregiver. °· Lie down in a dark, quiet room when you have a headache. °· Keep a headache journal to find out what may trigger your migraine headaches. For example, write down: °¨ What you eat and drink. °¨ How much sleep you get. °¨ Any change to your diet or medicines. °· Try massage or other relaxation techniques. °· Put ice packs or heat on the head and neck. Use these 3 to 4 times per day for 15 to 20 minutes each time, or as needed. °· Limit stress. °· Sit up straight, and do not tense your muscles. °· Quit smoking if you smoke. °· Limit alcohol use. °· Decrease the amount of caffeine you drink, or stop drinking caffeine. °· Eat and sleep on a regular schedule. °· Get 7 to 9 hours of sleep, or as recommended by your caregiver. °· Keep lights dim if bright lights bother you and make your headaches worse. °SEEK MEDICAL CARE IF:  °· You have problems with the medicines you were prescribed. °· Your medicines are not working. °· You have a change from the usual headache. °· You have nausea or vomiting. °SEEK IMMEDIATE MEDICAL CARE IF:  °· Your headache becomes severe. °· You have a fever. °· You have a stiff neck. °· You have loss of vision. °· You have muscular weakness or loss of muscle  control. °· You start losing your balance or have trouble walking. °· You feel faint or pass out. °· You have severe symptoms that are different from your first symptoms. °MAKE SURE YOU:  °· Understand these instructions. °· Will watch your condition. °· Will get help right away if you are not doing well or get worse. °Document Released: 04/02/2005 Document Revised: 06/25/2011 Document Reviewed: 04/18/2011 °ExitCare® Patient Information ©2015 [employer]. This information is not intended to replace advice given to you by your health care provider. Make sure you discuss any questions you have with your health care provider. ° °

## 2014-02-16 NOTE — ED Notes (Addendum)
Pt states he woke up from a nap earlier today and has been feeling lightheaded. Has been nauseated but no vomiting. States he just feels "yucky". Pt is seeing a neurologist and has had blood work done. Is not classifying these as headaches. Next step will be a carotid doppler on Thursday.

## 2014-02-18 ENCOUNTER — Encounter: Payer: Self-pay | Admitting: Neurology

## 2014-02-18 ENCOUNTER — Ambulatory Visit (INDEPENDENT_AMBULATORY_CARE_PROVIDER_SITE_OTHER): Payer: BC Managed Care – PPO

## 2014-02-18 DIAGNOSIS — R519 Headache, unspecified: Secondary | ICD-10-CM

## 2014-02-18 DIAGNOSIS — R51 Headache: Secondary | ICD-10-CM

## 2014-02-19 ENCOUNTER — Telehealth: Payer: Self-pay | Admitting: Neurology

## 2014-02-19 MED ORDER — TRAMADOL HCL 50 MG PO TABS: 50.0000 mg | ORAL_TABLET | Freq: Four times a day (QID) | ORAL | Status: DC | PRN

## 2014-02-19 NOTE — Telephone Encounter (Signed)
I called the patient. I left a message. I will try calling back later.

## 2014-02-19 NOTE — Telephone Encounter (Signed)
I called the patient. The patient had a steroid injection in the ear today, and he developed a constant pain in the right temporal area following the injection. He is to have a series of 6 injections, 2 a week. The carotid doppler study was done, and the results are pending. I will call in ultram for the patient for pain today. ? LP in the future.

## 2014-02-22 ENCOUNTER — Telehealth: Payer: Self-pay | Admitting: Neurology

## 2014-02-22 DIAGNOSIS — R51 Headache: Principal | ICD-10-CM

## 2014-02-22 DIAGNOSIS — G8929 Other chronic pain: Secondary | ICD-10-CM

## 2014-02-22 NOTE — Telephone Encounter (Signed)
I called the patient. The carotid Doppler study is unremarkable, we will get a lumbar puncture set up in our office.

## 2014-02-23 NOTE — Telephone Encounter (Signed)
Left message for patient to call back to schedule his lumbar puncture.  I relayed that the doctor does this at noon during the week.

## 2014-02-23 NOTE — Telephone Encounter (Signed)
Patient is scheduled for Friday 02-26-14 for lumbar puncture.

## 2014-02-26 ENCOUNTER — Other Ambulatory Visit: Payer: Self-pay | Admitting: Neurology

## 2014-02-26 ENCOUNTER — Encounter: Payer: Self-pay | Admitting: Neurology

## 2014-02-26 ENCOUNTER — Ambulatory Visit (INDEPENDENT_AMBULATORY_CARE_PROVIDER_SITE_OTHER): Payer: BC Managed Care – PPO | Admitting: Neurology

## 2014-02-26 VITALS — BP 120/85 | HR 98

## 2014-02-26 DIAGNOSIS — R51 Headache: Principal | ICD-10-CM

## 2014-02-26 DIAGNOSIS — R519 Headache, unspecified: Secondary | ICD-10-CM

## 2014-02-26 DIAGNOSIS — G8929 Other chronic pain: Secondary | ICD-10-CM

## 2014-02-26 NOTE — Progress Notes (Signed)
The patient comes in today for lumbar puncture, please refer to lumbar puncture note.

## 2014-02-26 NOTE — Procedures (Signed)
    Lumbar puncture procedure note  History:  Zachary Grant is a 38 year old patient with sudden onset of hearing loss in the right ear, and onset of right-sided headache. The patient has had persistent headache since that time without return of the hearing. The patient has had an unremarkable MRI the brain. He comes in for lumbar puncture to evaluate the ongoing headaches.  The patient was placed in the fetal position on the right side, and the low back was cleaned with Betadine solution. Approximately 2 cc of 1% Xylocaine was used as a local anesthetic. A 20-gauge spinal needle was inserted into the L3-4 interspace, and approximately 18 cc of clear colorless spinal fluid was removed for testing. Opening pressure was 180 mm of water.  Tube #1 was sent for VDRL, cryptococcal antigen, angiotensin-converting enzyme level, and herpes PCR.  Tube #2 was sent for oligoclonal banding, IgG albumin ratio.  Tube #3 was sent for cells, differential, glucose, and protein.  Tube #4 was sent for Lyme antibody panel.  The patient tolerated the procedure well. No complications of the above procedure were noted.   Blood work was not ordered.

## 2014-02-27 LAB — PROTEIN, CSF: Protein, CSF: 28.9 mg/dL (ref 0.0–44.0)

## 2014-02-27 LAB — CELL COUNT, CSF
Nuc cell # CSF: 3 cells/uL (ref 0–5)
RBC, CSF: 0 /uL

## 2014-02-27 LAB — GLUCOSE, CSF: Glucose, CSF: 58 mg/dL (ref 40–70)

## 2014-03-02 LAB — LYME, WESTERN BLOT, CSF
LYME IGG WB INTERP.: NEGATIVE
LYME IGM WB INTERP.: NEGATIVE
P18 AB.: ABSENT
P23 AB. IGM: ABSENT
P23 Ab.: ABSENT
P28 AB.: ABSENT
P30 AB.: ABSENT
P39 AB. IGM: ABSENT
P39 Ab.: ABSENT
P41 Ab. IgM: ABSENT
P41 Ab.: ABSENT
P45 Ab.: ABSENT
P58 Ab.: ABSENT
P66 Ab.: ABSENT
P93 AB.: ABSENT

## 2014-03-03 LAB — HSV 1/2 AB SCREEN IGG, CSF: HSV 1/2 Ab Screen IgG, CSF: 0.73 IV (ref <=0.89)

## 2014-03-04 ENCOUNTER — Encounter: Payer: Self-pay | Admitting: Neurology

## 2014-03-04 LAB — IGG CSF INDEX
ALBUMIN CSF-MCNC: 21 mg/dL (ref 11–48)
Albumin: 4.3 g/dL (ref 3.5–5.5)
IGG (IMMUNOGLOBIN G), SERUM: 744 mg/dL (ref 700–1600)
IGG INDEX CSF: 0.5 (ref 0.0–0.7)
IgG, CSF: 1.8 mg/dL (ref 0.0–8.6)
IgG/Albumin Ratio, CSF: 0.09 (ref 0.00–0.25)

## 2014-03-04 LAB — OLIGOCLONAL BANDING

## 2014-03-06 ENCOUNTER — Telehealth: Payer: BC Managed Care – PPO | Admitting: Family

## 2014-03-06 DIAGNOSIS — J069 Acute upper respiratory infection, unspecified: Secondary | ICD-10-CM

## 2014-03-07 MED ORDER — AZITHROMYCIN 250 MG PO TABS: ORAL_TABLET | ORAL | Status: DC

## 2014-03-07 MED ORDER — BENZONATATE 100 MG PO CAPS: 100.0000 mg | ORAL_CAPSULE | Freq: Three times a day (TID) | ORAL | Status: DC | PRN

## 2014-03-07 NOTE — Progress Notes (Signed)
We are sorry that you are not feeling well.  Here is how we plan to help!  Based on what you have shared with me it looks like you have upper respiratory tract inflammation that has resulted in a signification cough.  Inflammation and infection in the upper respiratory tract is commonly called bronchitis and has four common causes:  Allergies, Viral Infections, Acid Reflux and Bacterial Infections.  Allergies, viruses and acid reflux are treated by controlling symptoms or eliminating the cause. An example might be a cough caused by taking certain blood pressure medications. You stop the cough by changing the medication. Another example might be a cough caused by acid reflux. Controlling the reflux helps control the cough.  Based on your presentation I believe you most likely have A cough due to bacteria.  When patients have a fever and a productive cough with a change in color or increased sputum production, we are concerned about bacterial bronchitis.  If left untreated it can progress to pneumonia.  If your symptoms do not improve with your treatment plan it is important that you contact your provider.   I have prescribed Azithromyin 250 mg: two tables now and then one tablet daily for 4 additonal days   In addition you may use A prescription cough medication called Tessalon Perles 100mg. You may take 1-2 capsules every 8 hours as needed for your cough.    HOME CARE . Only take medications as instructed by your medical team. . Complete the entire course of an antibiotic. . Drink plenty of fluids and get plenty of rest. . Avoid close contacts especially the very young and the elderly . Cover your mouth if you cough or cough into your sleeve. . Always remember to wash your hands . A steam or ultrasonic humidifier can help congestion.    GET HELP RIGHT AWAY IF: . You develop worsening fever. . You become short of breath . You cough up blood. . Your symptoms persist after you have completed your  treatment plan MAKE SURE YOU   Understand these instructions.  Will watch your condition.  Will get help right away if you are not doing well or get worse.  Your e-visit answers were reviewed by a board certified advanced clinical practitioner to complete your personal care plan.  Depending on the condition, your plan could have included both over the counter or prescription medications.  Please review your pharmacy choice.  If there is a problem, you may call our nursing hot line at [phone] and have the prescription routed to another pharmacy.  Your safety is important to us.  If you have drug allergies check your prescription carefully.    You can use MyChart to ask questions about today's visit, request a non-urgent call back, or ask for a work or school excuse.  You will get an e-mail in the next two days asking about your experience.  I hope that your e-visit has been valuable and will speed your recovery. Thank you for using e-visits.   

## 2014-03-08 LAB — CRYPTOCOCCUS ANTIGEN, CSF: Cryptococcus Antigen, CSF: NEGATIVE

## 2014-03-08 LAB — ANGIOTENSIN CONVERTING ENZYME, CSF: Angio Convert Enzyme, CSF: 1.9 U/L (ref 0.0–2.5)

## 2014-03-08 LAB — VDRL, CSF: VDRL Quant, CSF: NONREACTIVE

## 2014-03-11 ENCOUNTER — Encounter: Payer: Self-pay | Admitting: Neurology

## 2014-03-15 ENCOUNTER — Telehealth: Payer: Self-pay | Admitting: Neurology

## 2014-03-15 MED ORDER — TRAMADOL HCL 50 MG PO TABS: 50.0000 mg | ORAL_TABLET | Freq: Four times a day (QID) | ORAL | Status: DC | PRN

## 2014-03-15 NOTE — Telephone Encounter (Signed)
I called the patient. The patient is taking Ultram every 4 hours. The patient has not really been taking the Topamax regularly. He is to get back on this medication, call me if he gets to 50 mg at night without much benefit. We will continue to increase the dosing. I will call in another prescription for the Ultram, but eventually we need to try to wean him off of this medication.

## 2014-03-15 NOTE — Telephone Encounter (Signed)
Pt [facility]

## 2014-03-22 ENCOUNTER — Ambulatory Visit: Payer: BC Managed Care – PPO | Admitting: Neurology

## 2014-04-05 ENCOUNTER — Encounter (HOSPITAL_BASED_OUTPATIENT_CLINIC_OR_DEPARTMENT_OTHER): Payer: BC Managed Care – PPO | Attending: Plastic Surgery

## 2014-04-05 DIAGNOSIS — S81802A Unspecified open wound, left lower leg, initial encounter: Secondary | ICD-10-CM | POA: Insufficient documentation

## 2014-04-05 DIAGNOSIS — S86092A Other specified injury of left Achilles tendon, initial encounter: Secondary | ICD-10-CM | POA: Diagnosis not present

## 2014-04-05 DIAGNOSIS — Y838 Other surgical procedures as the cause of abnormal reaction of the patient, or of later complication, without mention of misadventure at the time of the procedure: Secondary | ICD-10-CM | POA: Insufficient documentation

## 2014-04-05 DIAGNOSIS — T8189XA Other complications of procedures, not elsewhere classified, initial encounter: Secondary | ICD-10-CM | POA: Diagnosis present

## 2014-04-05 NOTE — Consult Note (Signed)
NAMMarcos Grant:  Grant, Zachary                 ACCOUNT NO.:  [id number]  MEDICAL RECORD NO.:  [id number]  LOCATION:  FOOT                         FACILITY:  [facility]  PHYSICIAN:  Glenna FellowsBrinda Sanya Kobrin, MD   DATE OF BIRTH:  1976-03-23  DATE OF CONSULTATION:  04/05/2014 DATE OF DISCHARGE:                                CONSULTATION   CHIEF COMPLAINT:  Left ankle ulcer.  HISTORY OF PRESENT ILLNESS:  The patient is a 38 year old male that underwent removal of bone spur and repair of Achilles tendon in 2014. His course was complicated by rupture of his Achilles tendon for which he underwent primary repair with Dr. Fanny DanceJah in May 2015.  Following this, the patient developed subsequent open wound.  He has undergone at least 2 applications of ACell within Dr. Marjean DonnaJah's clinic.  Of note, the patient has also had exposure of sutures used to repair his tendon, and he notes that on several visits with Dr. Fanny DanceJah that the foreign body material was removed.  His current wound care has been to place Betadine gauze on the wound.  He continues nonweightbearing secondary to his Achilles tendon injury.  Of note, the patient required steroid course for hearing loss from unknown source.  He has now been off steroids for approximately 2-3 weeks time.  He has been undergoing immunologic workup with regards to this hearing loss.  Most recent CBC is dated November 2015 with hemoglobin of 14.2, white blood count 5.2.  BMP with slightly elevated glucose at 112.  ANA, rheumatoid factor, and HIV were all negative. There has been no recent prealbumin in his chart.  ALLERGIES:  No known drug allergies.  PAST MEDICAL HISTORY:  As per HPI.  He also has a history of mild obstructive sleep apnea and mild asthma.  PAST SURGICAL HISTORY:  As noted above as well as a tonsillectomy.  SOCIAL HISTORY:  The patient is a former smoker and quit in 2010.  PHYSICAL EXAMINATION:  No sensory testing was done.  ABI calculated as 1.26, blood pressure is  111/78, pulse is 83, temperature is 98.2, weight is 200 pounds, height 5 feet 8 inches, left calf circumference is 35 cm, left ankle is 22.5 cm.  Over the left posterior ankle, there is an open wound measured as 1.7 x 1.4 x 0.1 cm, this is a cluster.  There is hypergranulation tissue present, and this was treated with silver nitrate today.  PLAN:  To institute collagen dressing changes, and his wife was instructed on this and counseled them to refrain from treating the wound with any Betadine or hydrogen peroxide as this also kills normal wound. We will plan for followup in 1-2 weeks time.          ______________________________ Glenna FellowsBrinda Chamara Dyck, MD MBA     BT/MEDQ  D:  04/05/2014  T:  04/05/2014  Job:  [id number]

## 2014-04-13 ENCOUNTER — Encounter: Payer: Self-pay | Admitting: Neurology

## 2014-04-13 ENCOUNTER — Ambulatory Visit (INDEPENDENT_AMBULATORY_CARE_PROVIDER_SITE_OTHER): Payer: BC Managed Care – PPO | Admitting: Neurology

## 2014-04-13 VITALS — BP 124/87 | HR 99 | Ht 68.0 in | Wt 196.6 lb

## 2014-04-13 DIAGNOSIS — R51 Headache: Secondary | ICD-10-CM

## 2014-04-13 DIAGNOSIS — R519 Headache, unspecified: Secondary | ICD-10-CM

## 2014-04-13 MED ORDER — TOPIRAMATE 50 MG PO TABS: ORAL_TABLET | ORAL | Status: DC

## 2014-04-13 NOTE — Patient Instructions (Signed)

## 2014-04-13 NOTE — Progress Notes (Signed)
Reason for visit: Headache  Zachary Grant is an 38 y.o. male  History of present illness:  [name] is a 38 year old right-handed white male with a history of sudden onset hearing loss in the right ear, and onset of a right frontotemporal headache that has been relatively persistent over the last 2 months. The patient has undergone MRI evaluation of the brain that shows no etiology for the headache, and he has undergone a lumbar puncture and further blood work that was unremarkable. He continues to have ongoing problems with the headache on a daily basis. The headache may last an hour or so, and then resolve. The patient feels well when the headache is not present. He was placed on Topamax, and the Topamax seemed to increase the headache free time during the day, but he has gone off the medication as he ran out of the prescription one week ago. The patient returns to this office for an evaluation. He reports no numbness or weakness of the face, arms, and legs. The hearing in the right ear has never returned.  Past Medical History  Diagnosis Date  . Traumatic rupture of left Achilles tendon   . Mild asthma   . OSA on CPAP     per study  05-04-2013 mild osa  . Wears contact lenses   . Hearing loss of right ear     Being worked up by ENT 01/2014  . Bilateral headaches     Being worked up by neurology - 01/2014  . Headache disorder 02/10/2014    Past Surgical History  Procedure Laterality Date  . Achilles tendon repair Left 2014    and removal spur  . Tonsillectomy  age 38  . Cardiovascular stress test  05-25-2011   DR Jacinto HalimGANJI    NORMAL PERFUSION/  NORMAL EF  . Achilles tendon surgery Left 09/04/2013    Procedure: LEFT ACHILLES TENDON REPAIR ;  Surgeon: Ludger NuttingN'Tuma Jah, MD;  Location: [facility];  Service: Podiatry;  Laterality: Left;    Family History  Problem Relation Age of Onset  . Asthma Mother   . Depression Brother     Social history:  reports that he quit  smoking about 5 years ago. His smoking use included Cigarettes. He has a 7 pack-year smoking history. He has never used smokeless tobacco. He reports that he does not drink alcohol or use illicit drugs.   No Known Allergies  Medications:  Current Outpatient Prescriptions on File Prior to Visit  Medication Sig Dispense Refill  . albuterol (PROVENTIL HFA;VENTOLIN HFA) 108 (90 BASE) MCG/ACT inhaler Inhale 1-2 puffs into the lungs every 6 (six) hours as needed for wheezing or shortness of breath.    Marland Kitchen. aspirin EC 81 MG tablet Take 162 mg by mouth at bedtime.     Marland Kitchen. ibuprofen (ADVIL,MOTRIN) 800 MG tablet Take 800 mg by mouth every 6 (six) hours as needed for moderate pain.     . Multiple Vitamin (MULTIVITAMIN WITH MINERALS) TABS tablet Take 1 tablet by mouth daily.    . Omega-3 Fatty Acids (FISH OIL) 1000 MG CAPS Take 1 capsule by mouth daily.    . traMADol (ULTRAM) 50 MG tablet Take 1 tablet (50 mg total) by mouth every 6 (six) hours as needed. 100 tablet 1   No current facility-administered medications on file prior to visit.    ROS:  Out of a complete 14 system review of symptoms, the patient complains only of the following symptoms, and all  other reviewed systems are negative.  Hearing loss Headache, numbness Snoring  Blood pressure 124/87, pulse 99, height 5\' 8"  (1.727 m), weight 196 lb 9.6 oz (89.177 kg).  Physical Exam  General: The patient is alert and cooperative at the time of the examination.  Skin: No significant peripheral edema is noted.   Neurologic Exam  Mental status: The patient is oriented x 3.  Cranial nerves: Facial symmetry is present. Speech is normal, no aphasia or dysarthria is noted. Extraocular movements are full. Visual fields are full.  Motor: The patient has good strength in all 4 extremities.  Sensory examination: Soft touch sensation on the face, arms, and legs is symmetric.  Coordination: The patient has good finger-nose-finger and heel-to-shin  bilaterally.  Gait and station: The patient has a normal gait. Tandem gait is normal. Romberg is negative. No drift is seen.  Reflexes: Deep tendon reflexes are symmetric.   Assessment/Plan:  1. Hearing loss, right  2. Right frontotemporal headache  The patient continues to have ongoing headaches. The patient has gained some benefit from the Topamax, we will go up on the dose. The patient will be placed back on 50 mg daily for one week, then go to 100 mg daily. If he is tolerating this after another 2 weeks, he will contact our office, and we will go up on the dose to 150 mg daily. He will follow-up in 3 months.  Marlan Palau. Keith Willis MD 04/13/2014 9:57 PM  [facility] [address] [place], [place] [id number]  Phone [phone] Fax [phone]

## 2014-04-19 ENCOUNTER — Encounter (HOSPITAL_BASED_OUTPATIENT_CLINIC_OR_DEPARTMENT_OTHER): Payer: BLUE CROSS/BLUE SHIELD | Attending: Plastic Surgery

## 2014-04-19 DIAGNOSIS — T8189XD Other complications of procedures, not elsewhere classified, subsequent encounter: Secondary | ICD-10-CM | POA: Insufficient documentation

## 2014-04-19 DIAGNOSIS — L97429 Non-pressure chronic ulcer of left heel and midfoot with unspecified severity: Secondary | ICD-10-CM | POA: Diagnosis not present

## 2014-04-19 DIAGNOSIS — Y839 Surgical procedure, unspecified as the cause of abnormal reaction of the patient, or of later complication, without mention of misadventure at the time of the procedure: Secondary | ICD-10-CM | POA: Diagnosis not present

## 2014-04-25 ENCOUNTER — Other Ambulatory Visit: Payer: Self-pay | Admitting: Neurology

## 2014-04-26 ENCOUNTER — Encounter: Payer: Self-pay | Admitting: Neurology

## 2014-04-26 NOTE — Telephone Encounter (Signed)
Pharmacy is requesting a refill on Tramadol.  Note from 11/30 says: I called the patient. The patient is taking Ultram every 4 hours. The patient has not really been taking the Topamax regularly. He is to get back on this medication, call me if he gets to 50 mg at night without much benefit. We will continue to increase the dosing. I will call in another prescription for the Ultram, but eventually we need to try to wean him off of this medication. Would you like to refill? Thank you.

## 2014-04-26 NOTE — Telephone Encounter (Signed)
Rx signed and faxed.

## 2014-04-29 ENCOUNTER — Telehealth: Payer: BLUE CROSS/BLUE SHIELD | Admitting: Physician Assistant

## 2014-04-29 DIAGNOSIS — M545 Low back pain, unspecified: Secondary | ICD-10-CM

## 2014-04-29 DIAGNOSIS — M5440 Lumbago with sciatica, unspecified side: Principal | ICD-10-CM

## 2014-04-29 MED ORDER — CYCLOBENZAPRINE HCL 10 MG PO TABS: 10.0000 mg | ORAL_TABLET | Freq: Three times a day (TID) | ORAL | Status: DC | PRN

## 2014-04-29 MED ORDER — NAPROXEN 500 MG PO TABS: 500.0000 mg | ORAL_TABLET | Freq: Two times a day (BID) | ORAL | Status: DC

## 2014-04-29 NOTE — Progress Notes (Signed)
We are sorry that you are not feeling well.  Here is how we plan to help!  Based on what you have shared with me it looks like you mostly have an acute flare up of back pain that sounds like sciatica.  Acute back pain is defined as musculoskeletal pain that can resolve in 1-3 weeks with conservative treatment.  I have prescribed Naprosyn 500 mg twice a day non-steroid anti-inflammatory (NSAID) as well as Flexeril 10 mg every eight hours as needed which is a muscle relaxer.  Some patients experience stomach irritation or in increased heartburn with anti-inflammatory drugs.  Please keep in mind that muscle relaxer's can cause fatigue and should not be taken while at work or driving.  Back pain is very common.  The pain often gets better over time.  The cause of back pain is usually not dangerous.  Most people can learn to manage their back pain on their own.  Home Care  Stay active.  Start with short walks on flat ground if you can.  Try to walk farther each day.  Do not sit, drive or stand in one place for more than 30 minutes.  Do not stay in bed.  Do not avoid exercise or work.  Activity can help your back heal faster.  Be careful when you bend or lift an object.  Bend at your knees, keep the object close to you, and do not twist.  Sleep on a firm mattress.  Lie on your side, and bend your knees.  If you lie on your back, put a pillow under your knees.  Only take medicines as told by your doctor.  Put ice on the injured area.  Put ice in a plastic bag  Place a towel between your skin and the bag  Leave the ice on for 15-20 minutes, 3-4 times a day for the first 2-3 days.  After that, you can switch between ice and heat packs.  Ask your doctor about back exercises or massage.  Avoid feeling anxious or stressed.  Find good ways to deal with stress, such as exercise.  Get Help Right Way If:  Your pain does not go away with rest or medicine.  Your pain does not go away in 1  week.  You have new problems.  You do not feel well.  The pain spreads into your legs.  You cannot control when you poop (bowel movement) or pee (urinate)  You feel sick to your stomach (nauseous) or throw up (vomit)  You have belly (abdominal) pain.  You feel like you may pass out (faint).  If you develop a fever.  Make Sure you:  Understand these instructions.  Will watch your condition  Will get help right away if you are not doing well or get worse.  Your e-visit answers were reviewed by a board certified advanced clinical practitioner to complete your personal care plan.  Depending on the condition, your plan could have included both over the counter or prescription medications.  If there is a problem please reply  once you have received a response from your provider.  Your safety is important to us.  If you have drug allergies check your prescription carefully.    You can use MyChart to ask questions about today's visit, request a non-urgent call back, or ask for a work or school excuse.  You will get an e-mail in the next two days asking about your experience.  I hope that your e-visit has been  valuable and will speed your recovery. Thank you for using e-visits.   

## 2014-05-03 DIAGNOSIS — T8189XD Other complications of procedures, not elsewhere classified, subsequent encounter: Secondary | ICD-10-CM | POA: Diagnosis not present

## 2014-05-03 DIAGNOSIS — L97429 Non-pressure chronic ulcer of left heel and midfoot with unspecified severity: Secondary | ICD-10-CM | POA: Diagnosis not present

## 2014-05-04 NOTE — Progress Notes (Signed)
[facility]  NAME:  Zachary Grant, Zachary Grant                      ACCOUNT NO.:  MEDICAL RECORD NO.:  [id number]      DATE OF BIRTH:  18-Jan-1976  PHYSICIAN:  Wayland Denislaire Sanger, DO            VISIT DATE:                                  OFFICE VISIT   The patient is a 39 year old male who is here for followup on his left lower extremity, heel area.  He had surgery and this remained open.  He has been using collagen on the area with very good improvement.  He is granulating well, but a little bit of over grade hypergranulation.  No sign of infection.  His breathing is unlabored.  Heart rate is regular. Silver nitrate was used for the hypergranulation and we will continue with the collagen.  I have also asked him to do multivitamin, vitamin C, zinc, elevation protein and we will see him back in 1 week.     Wayland Denislaire Sanger, DO     CS/MEDQ  D:  05/03/2014  T:  05/04/2014  Job:  [id number]

## 2014-05-17 ENCOUNTER — Encounter (HOSPITAL_BASED_OUTPATIENT_CLINIC_OR_DEPARTMENT_OTHER): Payer: BLUE CROSS/BLUE SHIELD | Attending: Plastic Surgery

## 2014-05-17 DIAGNOSIS — L97321 Non-pressure chronic ulcer of left ankle limited to breakdown of skin: Secondary | ICD-10-CM | POA: Diagnosis present

## 2014-05-18 NOTE — Progress Notes (Signed)
[facility]  NAME:  Zachary Grant, Zachary Grant                      ACCOUNT NO.:  MEDICAL RECORD NO.:  [id number]      DATE OF BIRTH:  10-20-1975  PHYSICIAN:  Wayland Denislaire Sanger, DO       VISIT DATE:  05/17/2014                                  OFFICE VISIT   The patient is a 39 year old gentleman, who is here for a followup on his heel ulcer, chronic.  He underwent a surgery with removal of the heel spur and then had some complications.  He presented with an ulcer. He has been using collagen on the area.  It does not appear to be infected.  He had  a little bit of hypergranulation, and we have been using some silver nitrate on that with good improvement.  So, we will continue with the collagen,  elevation, multivitamin, vitamin C, and zinc.  He can hold off on the vitamin A and protein, and we will see him back in a week.     Wayland Denislaire Sanger, DO     CS/MEDQ  D:  05/17/2014  T:  05/18/2014  Job:  [id number]

## 2014-05-24 DIAGNOSIS — L97321 Non-pressure chronic ulcer of left ankle limited to breakdown of skin: Secondary | ICD-10-CM | POA: Diagnosis not present

## 2014-05-31 ENCOUNTER — Other Ambulatory Visit: Payer: Self-pay | Admitting: Neurology

## 2014-05-31 NOTE — Telephone Encounter (Signed)
Pharmacy contacted us saying the patient is requesting a refill on Tramadol.  A Rx was sent for #100 + 1 refill on 01/11.  I called the pharmacy.  Spoke with Kathlene NovemberMike.  He said the patient filled #100 on 01/11 and the Rx was refilled again on 02/02.  Would you like to send additional refills at this time?  Please advise.  Thank you.

## 2014-06-01 ENCOUNTER — Other Ambulatory Visit: Payer: Self-pay | Admitting: Neurology

## 2014-06-01 NOTE — Telephone Encounter (Signed)
Rx signed and faxed.

## 2014-06-02 DIAGNOSIS — L97321 Non-pressure chronic ulcer of left ankle limited to breakdown of skin: Secondary | ICD-10-CM | POA: Diagnosis not present

## 2014-06-03 NOTE — H&P (Signed)
NAMMarcos Eke:  Kurt, Quatavious                 ACCOUNT NO.:  [id number]  MEDICAL RECORD NO.:  [id number]  LOCATION:  FOOT                         FACILITY:  [facility]  PHYSICIAN:  Evlyn KannerErrol Jaiven Graveline, MD       DATE OF BIRTH:  January 23, 1976  DATE OF ADMISSION:  05/17/2014 DATE OF DISCHARGE:                             HISTORY & PHYSICAL   This 39 year old gentleman comes with a chief complaint of having an ulceration on his left ankle which he has had since December of 2015. He sees Dr. Leta Baptisthimmappa on a regular basis and the last time he was seen by her was about a week ago, but missed his appointment on Monday because of the inclement weather.  When she saw him, the area was doing well except for recurrent hypergranulation tissue and she applied silver nitrate and was using Hydrofera Blue.  The patient is doing fine.  He is nonweightbearing and has come for a review today.  PHYSICAL EXAMINATION:  On clinical examination, he is awake and alert, lying comfortably in bed.  Vital signs were stable and he is afebrile. He is 5 feet 8 inches in height and 200 pounds.  His BP is 119/78, respirations 17.  On examining the posterior part of his left ankle, he has a small area which has been the area where there is hypergranulation and this is 0.2 cm x 0.2 cm x 0.1 cm.  The area has some hypergranulation and a bleb and on gently debriding it, it is bleeding on touch.  It is controlled with pressure.  There is medial to this, a suture spitting out and is probably one of the deeper sutures used earlier.  I have removed the suture with scissors and forceps, and the area is not bleeding or any other issues.  The wound otherwise looks very good and there is no other discharge or granulation.  ASSESSMENT AND PLAN: 1. Right posterior ankle healing nonsurgical wound which has been     there for several months.  I have recommended we apply a small     piece of silver alginate and do a local dressing.  The patient has     also  been instructed to come back for review in a week's time, but     due to him being out of town, he is going to reschedule for     approximately 2 weeks from now.  All questions have been answered,     and he will come back as planned.          ______________________________ Evlyn KannerErrol Vanna Sailer, MD     EB/MEDQ  D:  06/02/2014  T:  06/03/2014  Job:  [id number]  cc:   Wound Care Office

## 2014-06-16 ENCOUNTER — Encounter (HOSPITAL_BASED_OUTPATIENT_CLINIC_OR_DEPARTMENT_OTHER): Payer: BLUE CROSS/BLUE SHIELD | Attending: Surgery

## 2014-06-16 DIAGNOSIS — Y839 Surgical procedure, unspecified as the cause of abnormal reaction of the patient, or of later complication, without mention of misadventure at the time of the procedure: Secondary | ICD-10-CM | POA: Insufficient documentation

## 2014-06-16 DIAGNOSIS — S91002D Unspecified open wound, left ankle, subsequent encounter: Secondary | ICD-10-CM | POA: Insufficient documentation

## 2014-06-16 DIAGNOSIS — T8189XD Other complications of procedures, not elsewhere classified, subsequent encounter: Secondary | ICD-10-CM | POA: Insufficient documentation

## 2014-06-21 ENCOUNTER — Other Ambulatory Visit: Payer: Self-pay | Admitting: Neurology

## 2014-06-22 ENCOUNTER — Other Ambulatory Visit: Payer: Self-pay | Admitting: Neurology

## 2014-06-23 DIAGNOSIS — T8189XD Other complications of procedures, not elsewhere classified, subsequent encounter: Secondary | ICD-10-CM | POA: Diagnosis not present

## 2014-06-23 DIAGNOSIS — Y839 Surgical procedure, unspecified as the cause of abnormal reaction of the patient, or of later complication, without mention of misadventure at the time of the procedure: Secondary | ICD-10-CM | POA: Diagnosis not present

## 2014-06-23 DIAGNOSIS — S91002D Unspecified open wound, left ankle, subsequent encounter: Secondary | ICD-10-CM | POA: Diagnosis present

## 2014-06-23 NOTE — Telephone Encounter (Signed)
Rx last filled on 02/16.  Okay to refill?  Thank you .

## 2014-06-23 NOTE — Telephone Encounter (Signed)
Rx signed and faxed.

## 2014-06-30 DIAGNOSIS — T8189XD Other complications of procedures, not elsewhere classified, subsequent encounter: Secondary | ICD-10-CM | POA: Diagnosis not present

## 2014-07-07 ENCOUNTER — Encounter: Payer: Self-pay | Admitting: Neurology

## 2014-07-07 DIAGNOSIS — T8189XD Other complications of procedures, not elsewhere classified, subsequent encounter: Secondary | ICD-10-CM | POA: Diagnosis not present

## 2014-07-14 ENCOUNTER — Encounter: Payer: Self-pay | Admitting: Neurology

## 2014-07-14 ENCOUNTER — Ambulatory Visit (INDEPENDENT_AMBULATORY_CARE_PROVIDER_SITE_OTHER): Payer: BLUE CROSS/BLUE SHIELD | Admitting: Neurology

## 2014-07-14 ENCOUNTER — Other Ambulatory Visit: Payer: Self-pay | Admitting: Neurology

## 2014-07-14 VITALS — BP 130/78 | HR 93 | Ht 68.0 in | Wt 197.6 lb

## 2014-07-14 DIAGNOSIS — H9191 Unspecified hearing loss, right ear: Secondary | ICD-10-CM

## 2014-07-14 DIAGNOSIS — R519 Headache, unspecified: Secondary | ICD-10-CM

## 2014-07-14 DIAGNOSIS — R51 Headache: Secondary | ICD-10-CM

## 2014-07-14 MED ORDER — GABAPENTIN 300 MG PO CAPS: ORAL_CAPSULE | ORAL | Status: DC

## 2014-07-14 NOTE — Patient Instructions (Signed)

## 2014-07-14 NOTE — Progress Notes (Signed)
Reason for visit: Headache  Zachary Grant is an 39 y.o. male  History of present illness:  Zachary Grant is a 39 year old right-handed white male with a history of headaches. The patient had onset of headaches associated with hearing loss and his right ear which has remained. The patient has tinnitus in that ear that may worsen with bad weather. The patient continues to have very frequent headaches, essentially daily that began in the right frontotemporal area, and as they dissipate, will tend to generalize around the head. The patient may have several headaches daily. The patient is on Topamax taking 50 mg the morning and 100 mg in the evening with some benefit with the headache, but this has resulted in some irritability. The patient has no other associated symptoms of nausea, or vomiting. He denies any numbness or weakness on the extremities. He has had a workup that has included a lumbar puncture that was unremarkable. 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 The patient returns to the office today for an evaluation. Carotid Doppler studies were unremarkable.  Past Medical History  Diagnosis Date  . Traumatic rupture of left Achilles tendon   . Mild asthma   . OSA on CPAP     per study  05-04-2013 mild osa  . Wears contact lenses   . Hearing loss of right ear     Being worked up by ENT 01/2014  . Bilateral headaches     Being worked up by neurology - 01/2014  . Headache disorder 02/10/2014    Past Surgical History  Procedure Laterality Date  . Achilles tendon repair Left 2014    and removal spur  . Tonsillectomy  age 56  . Cardiovascular stress test  05-25-2011   DR Jacinto Halim    NORMAL PERFUSION/  NORMAL EF  . Achilles tendon surgery Left 09/04/2013    Procedure: LEFT ACHILLES TENDON REPAIR ;  Surgeon: Ludger Nutting, MD;  Location: [facility];  Service: Podiatry;  Laterality: Left;    Family History   Problem Relation Age of Onset  . Asthma Mother   . Depression Brother     Social history:  reports that he quit smoking about 6 years ago. His smoking use included Cigarettes. He has a 7 pack-year smoking history. He has never used smokeless tobacco. He reports that he does not drink alcohol or use illicit drugs.   No Known Allergies  Medications:  Prior to Admission medications   Medication Sig Start Date End Date Taking? Authorizing Provider  albuterol (PROVENTIL HFA;VENTOLIN HFA) 108 (90 BASE) MCG/ACT inhaler Inhale 1-2 puffs into the lungs every 6 (six) hours as needed for wheezing or shortness of breath.   Yes Historical Provider, MD  aspirin EC 81 MG tablet Take 162 mg by mouth at bedtime.    Yes Historical Provider, MD  cyclobenzaprine (FLEXERIL) 10 MG tablet Take 1 tablet (10 mg total) by mouth 3 (three) times daily as needed for muscle spasms. 04/29/14  Yes Waldon Merl, PA-C  ibuprofen (ADVIL,MOTRIN) 800 MG tablet Take 800 mg by mouth every 6 (six) hours as needed for moderate pain.  08/26/13  Yes Cathlyn Parsons, NP  Multiple Vitamin (MULTIVITAMIN WITH MINERALS) TABS tablet Take 1 tablet by mouth daily.   Yes Historical Provider, MD  naproxen (NAPROSYN) 500 MG tablet Take 1 tablet (500 mg total) by mouth 2 (two) times daily with a meal. 04/29/14  Yes Kristine Garbe  Daphine DeutscherMartin, PA-C  Omega-3 Fatty Acids (FISH OIL) 1000 MG CAPS Take 1 capsule by mouth daily.   Yes Historical Provider, MD  topiramate (TOPAMAX) 50 MG tablet Take 100 mg by mouth at bedtime.   Yes Historical Provider, MD  traMADol (ULTRAM) 50 MG tablet TAKE ONE TABLET BY MOUTH EVERY 6 HOURS AS NEEDED 06/23/14  Yes York Spanielharles K Willis, MD  vitamin A 8000 UNIT capsule Take 8,000 Units by mouth daily.   Yes Historical Provider, MD    ROS:  Out of a complete 14 system review of symptoms, the patient complains only of the following symptoms, and all other reviewed systems are negative.  Hearing loss on the right, ringing in the  ear Right eye pain Back pain Headache  Blood pressure 130/78, pulse 93, height 5\' 8"  (1.727 m), weight 197 lb 9.6 oz (89.631 kg).  Physical Exam  General: The patient is alert and cooperative at the time of the examination.  Skin: No significant peripheral edema is noted.   Neurologic Exam  Mental status: The patient is alert and oriented x 3 at the time of the examination. The patient has apparent normal recent and remote memory, with an apparently normal attention span and concentration ability.   Cranial nerves: Facial symmetry is present. Speech is normal, no aphasia or dysarthria is noted. Extraocular movements are full. Visual fields are full.  Motor: The patient has good strength in all 4 extremities.  Sensory examination: Soft touch sensation is symmetric on the face, arms, and legs.  Coordination: The patient has good finger-nose-finger and heel-to-shin bilaterally.  Gait and station: The patient has a normal gait. Tandem gait is normal. Romberg is negative. No drift is seen.  Reflexes: Deep tendon reflexes are symmetric.   Assessment/Plan:  1. Chronic daily headache  2. Hearing loss, right ear  3. Abnormal MRI brain  The patient will undergo a repeat MRI of the brain with and without gadolinium, and a MRA of the head. The patient has an unusual headache syndrome. We will add gabapentin to the regimen taking 300 mg twice daily for 2 weeks, then go to 300 mg in the morning and 600 mg in the evening. The Topamax is causing irritability, he will cut back to taking 100 mg at night. He will follow-up in 4 months. In the future, we may consider Sphenocath treatments or Botox.  Marlan Palau. Keith Willis MD 07/14/2014 7:53 PM  [facility] [address] [place] [id number]  Phone [phone] Fax [phone]

## 2014-07-15 ENCOUNTER — Telehealth: Payer: Self-pay

## 2014-07-15 MED ORDER — TRAMADOL HCL 50 MG PO TABS: 50.0000 mg | ORAL_TABLET | Freq: Four times a day (QID) | ORAL | Status: DC | PRN

## 2014-07-15 NOTE — Telephone Encounter (Signed)
Msg sent to MD

## 2014-07-15 NOTE — Telephone Encounter (Signed)
Patient is requesting a refill on Tramadol.  Last prescribed on 03/09 for #100.  Would you like to refill at this time?  Please advise.  Thank you.

## 2014-07-15 NOTE — Telephone Encounter (Signed)
I will call in a prescription for the tramadol.

## 2014-07-21 ENCOUNTER — Ambulatory Visit (INDEPENDENT_AMBULATORY_CARE_PROVIDER_SITE_OTHER): Payer: BLUE CROSS/BLUE SHIELD

## 2014-07-21 ENCOUNTER — Encounter (HOSPITAL_BASED_OUTPATIENT_CLINIC_OR_DEPARTMENT_OTHER): Payer: BLUE CROSS/BLUE SHIELD | Attending: Surgery

## 2014-07-21 DIAGNOSIS — R51 Headache: Secondary | ICD-10-CM | POA: Diagnosis not present

## 2014-07-21 DIAGNOSIS — H9191 Unspecified hearing loss, right ear: Secondary | ICD-10-CM

## 2014-07-21 DIAGNOSIS — S86092A Other specified injury of left Achilles tendon, initial encounter: Secondary | ICD-10-CM | POA: Insufficient documentation

## 2014-07-21 DIAGNOSIS — T8189XA Other complications of procedures, not elsewhere classified, initial encounter: Secondary | ICD-10-CM | POA: Insufficient documentation

## 2014-07-21 DIAGNOSIS — Y839 Surgical procedure, unspecified as the cause of abnormal reaction of the patient, or of later complication, without mention of misadventure at the time of the procedure: Secondary | ICD-10-CM | POA: Diagnosis not present

## 2014-07-21 DIAGNOSIS — R519 Headache, unspecified: Secondary | ICD-10-CM

## 2014-07-21 MED ORDER — GADOPENTETATE DIMEGLUMINE 469.01 MG/ML IV SOLN
20.0000 mL | Freq: Once | INTRAVENOUS | Status: AC | PRN
Start: 2014-07-21 — End: 2014-07-21

## 2014-07-22 ENCOUNTER — Telehealth: Payer: Self-pay | Admitting: Neurology

## 2014-07-22 NOTE — Telephone Encounter (Signed)
MRI the brain and MRA of the head was unremarkable. I called the patient.   MRI brain 07/22/2014:  IMPRESSION:  Unremarkable MRI brain and IAC (with and without). Incidental right thalamic capillary telangiectasia (4mm), which is stable from MRI on 01/21/14. No other significant brain abnormalities.   MRA head 07/22/2014:  IMPRESSION:  Normal MRA head (without).

## 2014-07-28 DIAGNOSIS — T8189XA Other complications of procedures, not elsewhere classified, initial encounter: Secondary | ICD-10-CM | POA: Diagnosis not present

## 2014-08-04 DIAGNOSIS — T8189XA Other complications of procedures, not elsewhere classified, initial encounter: Secondary | ICD-10-CM | POA: Diagnosis not present

## 2014-08-17 ENCOUNTER — Other Ambulatory Visit: Payer: Self-pay | Admitting: Foot & Ankle Surgery

## 2014-08-17 ENCOUNTER — Ambulatory Visit
Admission: RE | Admit: 2014-08-17 | Discharge: 2014-08-17 | Disposition: A | Discharge status: Home / Self Care | Payer: BLUE CROSS/BLUE SHIELD | Source: Ambulatory Visit | Attending: Foot & Ankle Surgery | Admitting: Foot & Ankle Surgery

## 2014-08-17 DIAGNOSIS — L97929 Non-pressure chronic ulcer of unspecified part of left lower leg with unspecified severity: Secondary | ICD-10-CM

## 2014-08-17 MED ORDER — GADOBENATE DIMEGLUMINE 529 MG/ML IV SOLN
18.0000 mL | Freq: Once | INTRAVENOUS | Status: AC | PRN
  Administered 2014-08-17: 18 mL via INTRAVENOUS

## 2014-08-18 ENCOUNTER — Encounter (HOSPITAL_BASED_OUTPATIENT_CLINIC_OR_DEPARTMENT_OTHER): Payer: BLUE CROSS/BLUE SHIELD | Attending: Surgery

## 2014-08-18 DIAGNOSIS — S86092A Other specified injury of left Achilles tendon, initial encounter: Secondary | ICD-10-CM | POA: Diagnosis present

## 2014-08-18 DIAGNOSIS — T8189XA Other complications of procedures, not elsewhere classified, initial encounter: Secondary | ICD-10-CM | POA: Diagnosis not present

## 2014-08-18 DIAGNOSIS — Y839 Surgical procedure, unspecified as the cause of abnormal reaction of the patient, or of later complication, without mention of misadventure at the time of the procedure: Secondary | ICD-10-CM | POA: Insufficient documentation

## 2014-08-20 ENCOUNTER — Other Ambulatory Visit: Payer: BLUE CROSS/BLUE SHIELD

## 2014-08-25 DIAGNOSIS — S86092A Other specified injury of left Achilles tendon, initial encounter: Secondary | ICD-10-CM | POA: Diagnosis not present

## 2014-08-31 ENCOUNTER — Ambulatory Visit: Payer: BLUE CROSS/BLUE SHIELD | Admitting: Internal Medicine

## 2014-09-01 DIAGNOSIS — S86092A Other specified injury of left Achilles tendon, initial encounter: Secondary | ICD-10-CM | POA: Diagnosis not present

## 2014-09-15 ENCOUNTER — Encounter: Payer: Self-pay | Admitting: Neurology

## 2014-09-15 ENCOUNTER — Encounter (HOSPITAL_BASED_OUTPATIENT_CLINIC_OR_DEPARTMENT_OTHER): Payer: BLUE CROSS/BLUE SHIELD | Attending: Surgery

## 2014-09-15 ENCOUNTER — Telehealth: Payer: Self-pay | Admitting: Neurology

## 2014-09-15 DIAGNOSIS — Z9889 Other specified postprocedural states: Secondary | ICD-10-CM | POA: Insufficient documentation

## 2014-09-15 DIAGNOSIS — L97321 Non-pressure chronic ulcer of left ankle limited to breakdown of skin: Secondary | ICD-10-CM | POA: Insufficient documentation

## 2014-09-15 MED ORDER — MECLIZINE HCL 25 MG PO TABS: 25.0000 mg | ORAL_TABLET | Freq: Three times a day (TID) | ORAL | Status: DC | PRN

## 2014-09-15 NOTE — Telephone Encounter (Signed)
I called the patient. The patient is having a new problem over the last week and a half of a rocking sensation. The patient has had a gradual worsening of the tinnitus in the right ear. No change in headache, and the headache actually has gotten better with the use of gabapentin and Topamax. The patient will be given a trial on meclizine, but if the symptoms continue to worse, he may wish to see Dr. Gean QuintWolicke once again.

## 2014-09-15 NOTE — Telephone Encounter (Signed)
I called the patient. He stated that the pain he has in his head is getting worse. The medications Dr. Anne HahnWillis had given him are no longer helping and now he is experiencing vertigo, which he has not had with his headaches before. He stated that he feels dizzy and like he cannot walk a straight line. He denies any nausea/vomiting. He also denies any falls. He stated the vertigo made it hard for him to concentrate on things like typing the MyChart message he sent. I explained that I was going to send this message to Dr. Anne HahnWillis to see what he would recommend for the patient's pain and vertigo.

## 2014-09-19 ENCOUNTER — Other Ambulatory Visit: Payer: Self-pay | Admitting: Neurology

## 2014-09-20 NOTE — Telephone Encounter (Signed)
I called the pharmacy and spoke with Irving Burtonmily who verified the patient just refilled this Rx yesterday (Sunday) for #100 tabs.  Asked that we disregard this refill request.

## 2014-09-22 DIAGNOSIS — Z9889 Other specified postprocedural states: Secondary | ICD-10-CM | POA: Diagnosis not present

## 2014-09-22 DIAGNOSIS — L97321 Non-pressure chronic ulcer of left ankle limited to breakdown of skin: Secondary | ICD-10-CM | POA: Diagnosis not present

## 2014-10-06 ENCOUNTER — Other Ambulatory Visit: Payer: Self-pay | Admitting: Physician Assistant

## 2014-10-06 DIAGNOSIS — L97321 Non-pressure chronic ulcer of left ankle limited to breakdown of skin: Secondary | ICD-10-CM | POA: Diagnosis not present

## 2014-10-06 DIAGNOSIS — M5440 Lumbago with sciatica, unspecified side: Principal | ICD-10-CM

## 2014-10-06 DIAGNOSIS — M545 Low back pain, unspecified: Secondary | ICD-10-CM

## 2014-10-08 ENCOUNTER — Encounter: Payer: Self-pay | Admitting: Neurology

## 2014-10-11 ENCOUNTER — Other Ambulatory Visit: Payer: Self-pay | Admitting: Neurology

## 2014-10-11 MED ORDER — TRAMADOL HCL 50 MG PO TABS: 50.0000 mg | ORAL_TABLET | Freq: Four times a day (QID) | ORAL | Status: DC | PRN

## 2014-10-13 DIAGNOSIS — L97321 Non-pressure chronic ulcer of left ankle limited to breakdown of skin: Secondary | ICD-10-CM | POA: Diagnosis not present

## 2014-10-20 ENCOUNTER — Encounter (HOSPITAL_BASED_OUTPATIENT_CLINIC_OR_DEPARTMENT_OTHER): Payer: BLUE CROSS/BLUE SHIELD | Attending: Surgery

## 2014-11-17 ENCOUNTER — Ambulatory Visit: Payer: BLUE CROSS/BLUE SHIELD | Admitting: Neurology

## 2014-11-17 ENCOUNTER — Telehealth: Payer: Self-pay

## 2014-11-17 NOTE — Telephone Encounter (Signed)
Patient canceled appointment the day of appointment.  

## 2014-11-18 ENCOUNTER — Encounter: Payer: Self-pay | Admitting: Neurology

## 2015-01-12 ENCOUNTER — Encounter: Payer: Self-pay | Admitting: Neurology

## 2015-01-22 ENCOUNTER — Encounter (HOSPITAL_COMMUNITY): Payer: Self-pay | Admitting: Emergency Medicine

## 2015-01-22 ENCOUNTER — Emergency Department (HOSPITAL_COMMUNITY)
Admission: EM | Admit: 2015-01-22 | Discharge: 2015-01-22 | Disposition: A | Discharge status: Home / Self Care | Payer: Managed Care, Other (non HMO) | Attending: Emergency Medicine | Admitting: Emergency Medicine

## 2015-01-22 DIAGNOSIS — Z87828 Personal history of other (healed) physical injury and trauma: Secondary | ICD-10-CM | POA: Insufficient documentation

## 2015-01-22 DIAGNOSIS — J45909 Unspecified asthma, uncomplicated: Secondary | ICD-10-CM | POA: Diagnosis not present

## 2015-01-22 DIAGNOSIS — Z79899 Other long term (current) drug therapy: Secondary | ICD-10-CM | POA: Insufficient documentation

## 2015-01-22 DIAGNOSIS — Z9889 Other specified postprocedural states: Secondary | ICD-10-CM | POA: Insufficient documentation

## 2015-01-22 DIAGNOSIS — G4733 Obstructive sleep apnea (adult) (pediatric): Secondary | ICD-10-CM | POA: Diagnosis not present

## 2015-01-22 DIAGNOSIS — H9191 Unspecified hearing loss, right ear: Secondary | ICD-10-CM | POA: Insufficient documentation

## 2015-01-22 DIAGNOSIS — Z973 Presence of spectacles and contact lenses: Secondary | ICD-10-CM | POA: Diagnosis not present

## 2015-01-22 DIAGNOSIS — L089 Local infection of the skin and subcutaneous tissue, unspecified: Secondary | ICD-10-CM | POA: Diagnosis not present

## 2015-01-22 DIAGNOSIS — M25572 Pain in left ankle and joints of left foot: Secondary | ICD-10-CM | POA: Diagnosis present

## 2015-01-22 DIAGNOSIS — Z792 Long term (current) use of antibiotics: Secondary | ICD-10-CM | POA: Insufficient documentation

## 2015-01-22 DIAGNOSIS — Z87891 Personal history of nicotine dependence: Secondary | ICD-10-CM | POA: Diagnosis not present

## 2015-01-22 DIAGNOSIS — T798XXA Other early complications of trauma, initial encounter: Secondary | ICD-10-CM

## 2015-01-22 MED ORDER — SULFAMETHOXAZOLE-TRIMETHOPRIM 800-160 MG PO TABS: 1.0000 | ORAL_TABLET | Freq: Two times a day (BID) | ORAL | Status: AC

## 2015-01-22 MED ORDER — OXYCODONE-ACETAMINOPHEN 5-325 MG PO TABS: 1.0000 | ORAL_TABLET | ORAL | Status: DC | PRN

## 2015-01-22 MED ORDER — CEPHALEXIN 500 MG PO CAPS: 500.0000 mg | ORAL_CAPSULE | Freq: Four times a day (QID) | ORAL | Status: DC

## 2015-01-22 NOTE — ED Provider Notes (Signed)
CSN: [id number]     Arrival date & time 01/22/15  1721 History   First MD Initiated Contact with Patient 01/22/15 1737     Chief Complaint  Patient presents with  . Ankle Pain     (Consider location/radiation/quality/duration/timing/severity/associated sxs/prior Treatment) Patient is a 39 y.o. male presenting with ankle pain. The history is provided by the patient.  Ankle Pain Location:  Ankle Time since incident:  4 hours Injury: no   Ankle location:  L ankle  Damontre Millea is a age male who presents to the ED with left ankle pain that started this evening. He reports having three prior surgeries for ruptured achilles tendon in the last 2 years. Patient has no recent injury to the area. He had an MRI in May and then had a biopsy of the area. He recently finished a course of doxycycline. He has an appointment at [facility] in 4 days for evaluation for reconstruction of the area. His is concerned today because of the pain going up his leg.   Past Medical History  Diagnosis Date  . Traumatic rupture of left Achilles tendon   . Mild asthma   . OSA on CPAP     per study  05-04-2013 mild osa  . Wears contact lenses   . Hearing loss of right ear     Being worked up by ENT 01/2014  . Bilateral headaches     Being worked up by neurology - 01/2014  . Headache disorder 02/10/2014   Past Surgical History  Procedure Laterality Date  . Achilles tendon repair Left 2014    and removal spur  . Tonsillectomy  age 44  . Cardiovascular stress test  05-25-2011   DR Jacinto Halim    NORMAL PERFUSION/  NORMAL EF  . Achilles tendon surgery Left 09/04/2013    Procedure: LEFT ACHILLES TENDON REPAIR ;  Surgeon: Ludger Nutting, MD;  Location: [facility];  Service: Podiatry;  Laterality: Left;   Family History  Problem Relation Age of Onset  . Asthma Mother   . Depression Brother    Social History  Substance Use Topics  . Smoking status: Former Smoker -- 1.00 packs/day for 7 years    Types:  Cigarettes    Quit date: 06/03/2008  . Smokeless tobacco: Never Used  . Alcohol Use: No    Review of Systems Negative except as stated in HPI   Allergies  Review of patient's allergies indicates no known allergies.  Home Medications   Prior to Admission medications   Medication Sig Start Date End Date Taking? Authorizing Provider  albuterol (PROVENTIL HFA;VENTOLIN HFA) 108 (90 BASE) MCG/ACT inhaler Inhale 1-2 puffs into the lungs every 6 (six) hours as needed for wheezing or shortness of breath.    Historical Provider, MD  aspirin EC 81 MG tablet Take 162 mg by mouth at bedtime.     Historical Provider, MD  cephALEXin (KEFLEX) 500 MG capsule Take 1 capsule (500 mg total) by mouth 4 (four) times daily. 01/22/15   Joice Nazario Orlene Och, NP  cyclobenzaprine (FLEXERIL) 10 MG tablet Take 1 tablet (10 mg total) by mouth 3 (three) times daily as needed for muscle spasms. 04/29/14   Waldon Merl, PA-C  gabapentin (NEURONTIN) 300 MG capsule One capsule twice a day for 2 weeks, then take one capsule in the morning and 2 capsules in the evening 07/14/14   York Spaniel, MD  ibuprofen (ADVIL,MOTRIN) 800 MG tablet Take 800 mg by mouth every 6 (six)  hours as needed for moderate pain.  08/26/13   Cathlyn Parsons, NP  meclizine (ANTIVERT) 25 MG tablet Take 1 tablet (25 mg total) by mouth 3 (three) times daily as needed for dizziness. 09/15/14   York Spaniel, MD  Multiple Vitamin (MULTIVITAMIN WITH MINERALS) TABS tablet Take 1 tablet by mouth daily.    Historical Provider, MD  naproxen (NAPROSYN) 500 MG tablet Take 1 tablet (500 mg total) by mouth 2 (two) times daily with a meal. 04/29/14   Waldon Merl, PA-C  Omega-3 Fatty Acids (FISH OIL) 1000 MG CAPS Take 1 capsule by mouth daily.    Historical Provider, MD  oxyCODONE-acetaminophen (ROXICET) 5-325 MG tablet Take 1 tablet by mouth every 4 (four) hours as needed for severe pain. 01/22/15   Gibril Mastro Orlene Och, NP  sulfamethoxazole-trimethoprim (BACTRIM DS,SEPTRA  DS) 800-160 MG tablet Take 1 tablet by mouth 2 (two) times daily. 01/22/15 01/29/15  Gloristine Turrubiates Orlene Och, NP  topiramate (TOPAMAX) 50 MG tablet Take 100 mg by mouth at bedtime.    Historical Provider, MD  traMADol (ULTRAM) 50 MG tablet Take 1 tablet (50 mg total) by mouth every 6 (six) hours as needed. 10/11/14   York Spaniel, MD  vitamin A 8000 UNIT capsule Take 8,000 Units by mouth daily.    Historical Provider, MD   BP 138/76 mmHg  Pulse 90  Temp(Src) 98.1 F (36.7 C) (Oral)  Resp 16  Ht  (1.727 m)  Wt 195 lb (88.451 kg)  BMI 29.66 kg/m2  SpO2 97% Physical Exam  Constitutional: He is oriented to person, place, and time. He appears well-developed and well-nourished. No distress.  HENT:  Head: Normocephalic.  Eyes: Conjunctivae and EOM are normal.  Neck: Normal range of motion. Neck supple.  Cardiovascular: Normal rate.   Pulmonary/Chest: Effort normal.  Musculoskeletal:       Left ankle: He exhibits normal pulse. Achilles tendon exhibits pain and defect. Achilles tendon exhibits normal Thompson's test results.       Feet:  Area left ankle over achilles tendon with scaring and purulent drainage. Tender on exam. There is a small area of erythema surrounding the wound. No red streaking noted.    Neurological: He is alert and oriented to person, place, and time. No cranial nerve deficit.  Skin: Skin is warm and dry.  Psychiatric: He has a normal mood and affect. His behavior is normal.  Nursing note and vitals reviewed.   ED Course  Procedures (including critical care time) Dr. Hyacinth Meeker in to examine the patient.  Culture of drainage from wound  Labs Review MDM  39 y.o. male with pain and drainage from wound over achilles tendon. Stable for d/c without focal neuro deficits. Patient to keep his follow up as scheduled at [facility] in 4 days and will return to his surgeon here in [place] on Monday. (2 days). Discussed with the patient and all questioned fully answered. He will  return here if any problems arise.  Final diagnoses:  Infected wound, initial encounter       [facility], NP 01/22/15 1904  Eber Hong, MD 01/23/15 1630

## 2015-01-22 NOTE — ED Notes (Signed)
MD Milller at bedside.

## 2015-01-22 NOTE — ED Notes (Signed)
Pt states that he has three prior surgeries on his left achilles tendon in the last two years. Pt states pain increased tonight for no obvious reason. Pt A/O x4. NAD noted. Pt not ambulatory at this time.

## 2015-01-22 NOTE — ED Notes (Signed)
Left pedal pulse strong 

## 2015-01-22 NOTE — Discharge Instructions (Signed)
Take the medication as directed. Do not take the narcotic if driving as it will make you sleepy. Follow up with your doctor on Monday.  Wound Infection A wound infection happens when a type of germ (bacteria) grows in a wound. Caring for the infection can help the wound heal. Wound infections need treatment. HOME CARE   Only take medicine as told by your doctor.  Take your antibiotic medicine as told. Finish it even if you start to feel better.  Clean the wound with mild soap and water as told. Rinse the soap off. Pat the area dry with a clean towel. Do not rub the wound.  Change any bandages (dressings) as told by your doctor.  Put cream and a bandage on the wound as told by your doctor.  If the bandage sticks, wet it with soapy water to remove the bandage.  Change the bandage if it gets wet, dirty, or starts to smell.  Take showers. Do not take baths, swim, or do anything that puts your wound under water.  Avoid exercise that makes you sweat.  If your wound itches, use a medicine that helps stop itching. Do not pick or scratch at the wound.  Keep all doctor visits as told. GET HELP RIGHT AWAY IF:   You have more puffiness (swelling), pain, or redness around the wound.  You have more yellowish-white fluid (pus) coming from the wound.  You have a bad smell coming from the wound.  Your wound breaks open more.  You have a fever. MAKE SURE YOU:   Understand these instructions.  Will watch your condition.  Will get help right away if you are not doing well or get worse.   This information is not intended to replace advice given to you by your health care provider. Make sure you discuss any questions you have with your health care provider.   Document Released: 01/10/2008 Document Revised: 06/25/2011 Document Reviewed: 09/20/2014 Elsevier Interactive Patient Education [employer]

## 2015-01-26 LAB — WOUND CULTURE: Special Requests: NORMAL

## 2015-03-15 ENCOUNTER — Ambulatory Visit (INDEPENDENT_AMBULATORY_CARE_PROVIDER_SITE_OTHER): Payer: Managed Care, Other (non HMO) | Admitting: Neurology

## 2015-03-15 ENCOUNTER — Encounter: Payer: Self-pay | Admitting: Neurology

## 2015-03-15 VITALS — BP 135/76 | HR 95 | Ht 68.0 in | Wt 195.5 lb

## 2015-03-15 DIAGNOSIS — R51 Headache: Secondary | ICD-10-CM

## 2015-03-15 DIAGNOSIS — R519 Headache, unspecified: Secondary | ICD-10-CM

## 2015-03-15 MED ORDER — TRAMADOL HCL 50 MG PO TABS: 50.0000 mg | ORAL_TABLET | Freq: Four times a day (QID) | ORAL | Status: DC | PRN

## 2015-03-15 MED ORDER — INDOMETHACIN ER 75 MG PO CPCR: 75.0000 mg | ORAL_CAPSULE | Freq: Two times a day (BID) | ORAL | Status: DC

## 2015-03-15 MED ORDER — LAMOTRIGINE 25 MG PO TABS: ORAL_TABLET | ORAL | Status: DC

## 2015-03-15 NOTE — Patient Instructions (Signed)
   We will start Lamictal for the headache, will work up to 75 mg twice a day, once you get there, call for a prescription for the 100 mg tablets. We will start Indomethacin for the headache, and I will give you a prescription for Ultram.

## 2015-03-15 NOTE — Progress Notes (Signed)
Reason for visit: Headache   Zachary Grant is an 39 y.o. male  History of present illness:  [name] is a 39 year old right-handed white male with a history of a predominantly right frontotemporal headache. The patient indicates that he has headaches daily in nature, but he may have 2 or 3 separate individual headaches lasting one or 2 hours. The patient indicates that the headaches are not associated with a throbbing sensation, but they are a sharp shooting pain. The patient has no photophobia, phonophobia, or nausea or vomiting with the headache. The headaches have been present for greater than one year, and came on in association with deafness of the right ear. A thorough workup has not shown an etiology for the headache. He had a minimal response with Topamax, but Ultram or Percocet will eliminate the headache.The patient is currently off of the Topamax, he comes to this office for an evaluation. He denies any neck pain or neck stiffness with the headache.  Past Medical History  Diagnosis Date  . Traumatic rupture of left Achilles tendon   . Mild asthma   . OSA on CPAP     per study  05-04-2013 mild osa  . Wears contact lenses   . Hearing loss of right ear     Being worked up by ENT 01/2014  . Bilateral headaches     Being worked up by neurology - 01/2014  . Headache disorder 02/10/2014    Past Surgical History  Procedure Laterality Date  . Achilles tendon repair Left 2014    and removal spur  . Tonsillectomy  age 38  . Cardiovascular stress test  05-25-2011   DR Jacinto Halim    NORMAL PERFUSION/  NORMAL EF  . Achilles tendon surgery Left 09/04/2013    Procedure: LEFT ACHILLES TENDON REPAIR ;  Surgeon: Ludger Nutting, MD;  Location: [facility];  Service: Podiatry;  Laterality: Left;    Family History  Problem Relation Age of Onset  . Asthma Mother   . Depression Brother     Social history:  reports that he quit smoking about 6 years ago. His smoking use included  Cigarettes. He has a 7 pack-year smoking history. He has never used smokeless tobacco. He reports that he does not drink alcohol or use illicit drugs.    Allergies  Allergen Reactions  . Gabapentin     Irritability    Medications:  Prior to Admission medications   Medication Sig Start Date End Date Taking? Authorizing Provider  albuterol (PROVENTIL HFA;VENTOLIN HFA) 108 (90 BASE) MCG/ACT inhaler Inhale 1-2 puffs into the lungs every 6 (six) hours as needed for wheezing or shortness of breath.   Yes Historical Provider, MD  ibuprofen (ADVIL,MOTRIN) 800 MG tablet Take 800 mg by mouth every 6 (six) hours as needed for moderate pain.  08/26/13  Yes Cathlyn Parsons, NP  Multiple Vitamin (MULTIVITAMIN WITH MINERALS) TABS tablet Take 1 tablet by mouth daily.   Yes Historical Provider, MD    ROS:  Out of a complete 14 system review of symptoms, the patient complains only of the following symptoms, and all other reviewed systems are negative.  Hearing loss Eye pain Joint pain, back pain, neck pain, neck stiffness Headache  Blood pressure 135/76, pulse 95, height  (1.727 m), weight 195 lb 8 oz (88.678 kg).  Physical Exam  General: The patient is alert and cooperative at the time of the examination.  Neuromuscular: Range of movement of the cervical spine  was full.  Skin: No significant peripheral edema is noted.   Neurologic Exam  Mental status: The patient is alert and oriented x 3 at the time of the examination. The patient has apparent normal recent and remote memory, with an apparently normal attention span and concentration ability.   Cranial nerves: Facial symmetry is present. Speech is normal, no aphasia or dysarthria is noted. Extraocular movements are full. Visual fields are full.  Motor: The patient has good strength in all 4 extremities.  Sensory examination: Soft touch sensation is symmetric on the face, arms, and legs.  Coordination: The patient has good  finger-nose-finger and heel-to-shin bilaterally.  Gait and station: The patient has a normal gait. Tandem gait is normal. Romberg is negative. No drift is seen.  Reflexes: Deep tendon reflexes are symmetric.   Assessment/Plan:  One. Chronic daily headache  The patient continues to have ongoing headaches and the headaches do not appear to be consistent with migraine, and may be a sympathetic mediated headache. The patient will be started on indomethacin 75 mg twice daily, we may double dose in the future if this is not effective. The patient will be placed on Lamictal, the dose will be increased gradually. He will be placed back on some Ultram if needed. He will followup in 4 months, sooner if needed.  Marlan Palau. Keith Willis MD 03/15/2015 8:12 PM  [facility] [address] [place], [place] [address]  Phone [phone] Fax [phone]

## 2015-04-13 ENCOUNTER — Ambulatory Visit (INDEPENDENT_AMBULATORY_CARE_PROVIDER_SITE_OTHER): Payer: Managed Care, Other (non HMO) | Admitting: Physician Assistant

## 2015-04-13 VITALS — BP 128/80 | HR 80 | Temp 97.7°F | Resp 16 | Ht 69.0 in | Wt 197.0 lb

## 2015-04-13 DIAGNOSIS — S71102A Unspecified open wound, left thigh, initial encounter: Secondary | ICD-10-CM

## 2015-04-13 DIAGNOSIS — W57XXXA Bitten or stung by nonvenomous insect and other nonvenomous arthropods, initial encounter: Secondary | ICD-10-CM | POA: Diagnosis not present

## 2015-04-13 NOTE — Progress Notes (Signed)
Patient ID: Zachary RunnerRicky Grant, male    DOB: Aug 01, 1975, 39 y.o.   MRN: [id number]  PCP: No PCP Per Patient  Subjective:   Chief Complaint  Patient presents with  . Tick Removal    left thigh, x today     HPI Presents for evaluation of tick bite.  Saw a "deer tick" on the inner thigh today about 12:15. Tried to remove it, but thinks he removed just the body and that the head and mouthparts remained.  Later this evening, he noted that it was reddened in the area and the center was black.   Describes the tick as having been "an immature tick," approximately 3-4 mm in diameter.  He was outside yesterday in the warmer weather playing with the dog. He has also found ticks on the dog lately.    Review of Systems No fever, chills, GI/GU symptoms. No rash. No headache.    Patient Active Problem List   Diagnosis Date Noted  . Open wnd of foot 01/26/2015  . Hearing loss of right ear 07/14/2014  . Headache disorder 02/10/2014  . OSA on CPAP 02/06/2014  . Asthma, chronic 02/06/2014  . Achilles rupture, left 02/06/2014     Prior to Admission medications   Medication Sig Start Date End Date Taking? Authorizing Provider  albuterol (PROVENTIL HFA;VENTOLIN HFA) 108 (90 BASE) MCG/ACT inhaler Inhale 1-2 puffs into the lungs every 6 (six) hours as needed for wheezing or shortness of breath.   Yes Historical Provider, MD  doxycycline (VIBRA-TABS) 100 MG tablet Take 100 mg by mouth. 03/16/15  Yes Historical Provider, MD  ibuprofen (ADVIL,MOTRIN) 800 MG tablet Take 800 mg by mouth every 6 (six) hours as needed for moderate pain.  08/26/13  Yes Cathlyn ParsonsAngela M Kabbe, NP  Multiple Vitamin (MULTIVITAMIN WITH MINERALS) TABS tablet Take 1 tablet by mouth daily.   Yes Historical Provider, MD  traMADol (ULTRAM) 50 MG tablet Take 1 tablet (50 mg total) by mouth every 6 (six) hours as needed. Must last 28 days. 03/15/15  Yes York Spanielharles K Willis, MD  clindamycin (CLEOCIN) 300 MG capsule Take 300 mg by mouth.     Historical Provider, MD     Allergies  Allergen Reactions  . Gabapentin     Irritability       Objective:  Physical Exam  Constitutional: He is oriented to person, place, and time. He appears well-developed and well-nourished. He is active and cooperative. No distress.  BP 128/80 mmHg  Pulse 80  Temp(Src) 97.7 F (36.5 C) (Oral)  Resp 16  Ht 5\' 9"  (1.753 m)  Wt 197 lb (89.359 kg)  BMI 29.08 kg/m2  SpO2 98%   Eyes: Conjunctivae are normal.  Pulmonary/Chest: Effort normal.  Neurological: He is alert and oriented to person, place, and time.  Skin: Lesion noted.     Psychiatric: He has a normal mood and affect. His speech is normal and behavior is normal.    As it is not certain that the mouthparts were removed, he would like the skin excised at the site. With permission, the area was cleaned with alcohol and local anesthesia provided with 1.5 cc 1% lidocaine plain. Punch 4 mm punch used to excise the area in question en toto. Hemostasis achieved with pressure. Bandage applied.       Assessment & Plan:   1. Tick bite 2. Open wound of thigh, left, initial encounter Local wound care. Anticipatory guidance provided. No concern for tick-borne illness given the very short period of  time the tick was present.   Fernande Bras, PA-C Physician Assistant-Certified [facility]

## 2015-04-13 NOTE — Patient Instructions (Signed)
Keep the dressing on for several hours. I expect the bleeding to have stopped entirely by then. Wash daily with soap and water. Leave uncovered as much as possible. Apply a thin layer of antibiotic ointment twice each day. If you develop increasing redness, swelling, increased pain, thick drainage, red streaking, or if you start running a fever, please return.

## 2015-05-12 ENCOUNTER — Other Ambulatory Visit: Payer: Self-pay | Admitting: Neurology

## 2015-05-17 ENCOUNTER — Other Ambulatory Visit: Payer: Self-pay | Admitting: Neurology

## 2015-05-19 ENCOUNTER — Encounter: Payer: Self-pay | Admitting: Neurology

## 2015-05-19 ENCOUNTER — Other Ambulatory Visit: Payer: Self-pay | Admitting: Neurology

## 2015-05-19 MED ORDER — TRAMADOL HCL 50 MG PO TABS: 50.0000 mg | ORAL_TABLET | Freq: Four times a day (QID) | ORAL | Status: DC | PRN

## 2015-06-27 ENCOUNTER — Emergency Department (HOSPITAL_COMMUNITY): Payer: Managed Care, Other (non HMO)

## 2015-06-27 ENCOUNTER — Encounter (HOSPITAL_COMMUNITY): Payer: Self-pay | Admitting: *Deleted

## 2015-06-27 ENCOUNTER — Emergency Department (HOSPITAL_COMMUNITY)
Admission: EM | Admit: 2015-06-27 | Discharge: 2015-06-27 | Disposition: A | Discharge status: Home / Self Care | Payer: Managed Care, Other (non HMO) | Attending: Emergency Medicine | Admitting: Emergency Medicine

## 2015-06-27 DIAGNOSIS — J45901 Unspecified asthma with (acute) exacerbation: Secondary | ICD-10-CM | POA: Diagnosis not present

## 2015-06-27 DIAGNOSIS — Z9981 Dependence on supplemental oxygen: Secondary | ICD-10-CM | POA: Insufficient documentation

## 2015-06-27 DIAGNOSIS — Z87891 Personal history of nicotine dependence: Secondary | ICD-10-CM | POA: Diagnosis not present

## 2015-06-27 DIAGNOSIS — H9191 Unspecified hearing loss, right ear: Secondary | ICD-10-CM | POA: Diagnosis not present

## 2015-06-27 DIAGNOSIS — Z79899 Other long term (current) drug therapy: Secondary | ICD-10-CM | POA: Insufficient documentation

## 2015-06-27 DIAGNOSIS — R05 Cough: Secondary | ICD-10-CM | POA: Diagnosis present

## 2015-06-27 DIAGNOSIS — G4733 Obstructive sleep apnea (adult) (pediatric): Secondary | ICD-10-CM | POA: Insufficient documentation

## 2015-06-27 MED ORDER — ALBUTEROL SULFATE HFA 108 (90 BASE) MCG/ACT IN AERS: 1.0000 | INHALATION_SPRAY | Freq: Four times a day (QID) | RESPIRATORY_TRACT | Status: DC | PRN

## 2015-06-27 MED ORDER — PREDNISONE 50 MG PO TABS: ORAL_TABLET | ORAL | Status: DC

## 2015-06-27 MED ORDER — IPRATROPIUM-ALBUTEROL 0.5-2.5 (3) MG/3ML IN SOLN
3.0000 mL | Freq: Once | RESPIRATORY_TRACT | Status: AC
  Administered 2015-06-27: 3 mL via RESPIRATORY_TRACT
  Filled 2015-06-27: qty 3

## 2015-06-27 MED ORDER — PREDNISONE 20 MG PO TABS
50.0000 mg | ORAL_TABLET | Freq: Once | ORAL | Status: AC
  Administered 2015-06-27: 50 mg via ORAL
  Filled 2015-06-27: qty 3

## 2015-06-27 NOTE — ED Provider Notes (Signed)
CSN: [id number]     Arrival date & time 06/27/15  1744 History  By signing my name below, I, Octavia Heir, attest that this documentation has been prepared under the direction and in the presence of Roldan Laforest, PA-C. Electronically Signed: Octavia Heir, ED Scribe. 06/27/2015. 7:07 PM.    Chief Complaint  Patient presents with  . Asthma   The history is provided by the patient. No language interpreter was used.   HPI Comments: Zachary Grant is a 40 y.o. male who has a hx of asthma presents to the Emergency Department complaining of constant, gradual worsening asthma attack onset 2 hours ago. Pt reports having associated dry cough, chest tightness, shortness of breath with exertion and wheezing. He says when he talks he feels the tightness in his chest. The wheezing has improved since onset but the chest tightness and SOB persists. Pt works in [employer] and today he was in an area that had a lot of mold and mildew and he believes that sparked his asthmatic episode. Pt has not had a flare up this bad in about 7 years. He did not have a rescue inhaler on him and has no treatments PTA. He denies difficulty breathing or speaking currently. Denies fevers, chills, headache, dizziness, lightheadedness, syncope, vision changes, congestion, rhinorrhea, sore throat, chest pain, abdominal pain, nausea or vomiting.   Past Medical History  Diagnosis Date  . Traumatic rupture of left Achilles tendon   . Mild asthma   . OSA on CPAP     per study  05-04-2013 mild osa  . Wears contact lenses   . Hearing loss of right ear     Being worked up by ENT 01/2014  . Bilateral headaches     Being worked up by neurology - 01/2014  . Headache disorder 02/10/2014   Past Surgical History  Procedure Laterality Date  . Achilles tendon repair Left 2014    and removal spur  . Tonsillectomy  age 21  . Cardiovascular stress test  05-25-2011   DR Jacinto Halim    NORMAL PERFUSION/  NORMAL EF  . Achilles tendon surgery Left  09/04/2013    Procedure: LEFT ACHILLES TENDON REPAIR ;  Surgeon: Ludger Nutting, MD;  Location: [facility];  Service: Podiatry;  Laterality: Left;   Family History  Problem Relation Age of Onset  . Asthma Mother   . Depression Brother    Social History  Substance Use Topics  . Smoking status: Former Smoker -- 1.00 packs/day for 7 years    Types: Cigarettes    Quit date: 06/03/2008  . Smokeless tobacco: Never Used  . Alcohol Use: No    Review of Systems  Respiratory: Positive for cough, chest tightness, shortness of breath and wheezing.   All other systems reviewed and are negative.   Allergies  Gabapentin  Home Medications   Prior to Admission medications   Medication Sig Start Date End Date Taking? Authorizing Provider  albuterol (PROVENTIL HFA;VENTOLIN HFA) 108 (90 BASE) MCG/ACT inhaler Inhale 1-2 puffs into the lungs every 6 (six) hours as needed for wheezing or shortness of breath.    Historical Provider, MD  albuterol (PROVENTIL HFA;VENTOLIN HFA) 108 (90 Base) MCG/ACT inhaler Inhale 1-2 puffs into the lungs every 6 (six) hours as needed for wheezing or shortness of breath. 06/27/15   Sulma Ruffino, PA-C  ibuprofen (ADVIL,MOTRIN) 800 MG tablet Take 800 mg by mouth every 6 (six) hours as needed for moderate pain.  08/26/13   Cathlyn Parsons,  NP  Multiple Vitamin (MULTIVITAMIN WITH MINERALS) TABS tablet Take 1 tablet by mouth daily.    Historical Provider, MD  predniSONE (DELTASONE) 50 MG tablet Take one table once a day for 5 days 06/27/15   Jahquan Klugh, PA-C  traMADol (ULTRAM) 50 MG tablet Take 1 tablet (50 mg total) by mouth every 6 (six) hours as needed. Must last 28 days. 05/19/15   York Spaniel, MD   Triage vitals: BP 111/90 mmHg  Pulse 89  Temp(Src) 98.1 F (36.7 C) (Oral)  Resp 16  Ht  (1.727 m)  Wt 201 lb (91.173 kg)  BMI 30.57 kg/m2  SpO2 100% Physical Exam  Constitutional: He appears well-developed and well-nourished. No distress.  HENT:   Head: Normocephalic and atraumatic.  Mouth/Throat: Oropharynx is clear and moist. No oropharyngeal exudate.  Eyes: Conjunctivae are normal. Right eye exhibits no discharge. Left eye exhibits no discharge. No scleral icterus.  Neck: Normal range of motion. Neck supple.  Cardiovascular: Normal rate, regular rhythm and normal heart sounds.   Pulmonary/Chest: Effort normal. No accessory muscle usage or stridor. No tachypnea. No respiratory distress. He has decreased breath sounds. He has no wheezes.  Breathing unlaboared and pt is not in respiratory distress. O2 100%. Equal chest expansion. Mildly diminished breath sounds in all lung fields. Audible breath sounds in all fields. No wheezing at present.   Musculoskeletal: Normal range of motion.  Neurological: He is alert. Coordination normal.  Skin: Skin is warm and dry.  Psychiatric: He has a normal mood and affect. His behavior is normal.  Nursing note and vitals reviewed.   ED Course  Procedures  DIAGNOSTIC STUDIES: Oxygen Saturation is 100% on RA, normal by my interpretation.  COORDINATION OF CARE: 7:05 PM Discussed treatment plan which includes breathing treatment with pt at bedside and pt agreed to plan.  Labs Review Labs Reviewed - No data to display  Imaging Review Dg Chest 2 View  06/27/2015  CLINICAL DATA:  Asthma attack today. EXAM: CHEST  2 VIEW COMPARISON:  04/25/2012 FINDINGS: Lungs are hypoinflated without consolidation or effusion. Cardiomediastinal silhouette is within normal. There are mild degenerative changes of the spine chronic anterior wedging of a mid thoracic vertebral body. IMPRESSION: Hypoinflation without acute cardiopulmonary disease. Electronically Signed   By: Elberta Fortis M.D.   On: 06/27/2015 19:08   I have personally reviewed and evaluated these images and lab results as part of my medical decision-making.   EKG Interpretation None     8:00 - Re-evaluated patient after duoneb. Reports he is breathing  at baseline and feels "so much better". Denies wheezing, SOB or chest tightness. Pt ambulates around room and denies exertional dyspnea. Improved breath sounds and lungs CTAB. Will ambulate with pulse ox and discharge home.  MDM   Final diagnoses:  Asthma exacerbation   Patient presenting with asthma exacerbation. Treated with duoneb in ED with significant improvement in lung sounds and symptoms. CXR negative. Patient ambulated in ED with O2 saturations maintained >90. There are no current signs of respiratory distress. Prednisone given in the ED and pt will be discharged with 5 day burst and albuterol. Pt states they are breathing at baseline. Pt has been instructed to follow up with their PCP regarding today's ED visit and stressed importance of having inhaler on him at all times. Return precautions given in discharge paperwork and discussed with pt at bedside. Pt stable for discharge  I personally performed the services described in this documentation, which was scribed in  my presence. The recorded information has been reviewed and is accurate.   Rolm GalaStevi Maria Coin, PA-C 06/27/15 2036  Margarita Grizzleanielle Ray, MD 06/28/15 [phone]

## 2015-06-27 NOTE — ED Notes (Signed)
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 Pt now is coughing ,wheezing . Pt reports a HX of asthma  In the past . No flare up in approx '8 years.

## 2015-06-27 NOTE — Discharge Instructions (Signed)
Schedule a follow up appointment with your PCP. It is important to always have a rescue inhaler with you. Return to ED with worsening wheezing, SOB, chest tightness, difficulty breathing or any other new, worsening or concerning symptoms.    Asthma, Adult Asthma is a recurring condition in which the airways tighten and narrow. Asthma can make it difficult to breathe. It can cause coughing, wheezing, and shortness of breath. Asthma episodes, also called asthma attacks, range from minor to life-threatening. Asthma cannot be cured, but medicines and lifestyle changes can help control it. CAUSES Asthma is believed to be caused by inherited (genetic) and environmental factors, but its exact cause is unknown. Asthma may be triggered by allergens, lung infections, or irritants in the air. Asthma triggers are different for each person. Common triggers include:   Animal dander.  Dust mites.  Cockroaches.  Pollen from trees or grass.  Mold.  Smoke.  Air pollutants such as dust, household cleaners, hair sprays, aerosol sprays, paint fumes, strong chemicals, or strong odors.  Cold air, weather changes, and winds (which increase molds and pollens in the air).  Strong emotional expressions such as crying or laughing hard.  Stress.  Certain medicines (such as aspirin) or types of drugs (such as beta-blockers).  Sulfites in foods and drinks. Foods and drinks that may contain sulfites include dried fruit, potato chips, and sparkling grape juice.  Infections or inflammatory conditions such as the flu, a cold, or an inflammation of the nasal membranes (rhinitis).  Gastroesophageal reflux disease (GERD).  Exercise or strenuous activity. SYMPTOMS Symptoms may occur immediately after asthma is triggered or many hours later. Symptoms include:  Wheezing.  Excessive nighttime or early morning coughing.  Frequent or severe coughing with a common cold.  Chest tightness.  Shortness of  breath. DIAGNOSIS  The diagnosis of asthma is made by a review of your medical history and a physical exam. Tests may also be performed. These may include:  Lung function studies. These tests show how much air you breathe in and out.  Allergy tests.  Imaging tests such as X-rays. TREATMENT  Asthma cannot be cured, but it can usually be controlled. Treatment involves identifying and avoiding your asthma triggers. It also involves medicines. There are 2 classes of medicine used for asthma treatment:   Controller medicines. These prevent asthma symptoms from occurring. They are usually taken every day.  Reliever or rescue medicines. These quickly relieve asthma symptoms. They are used as needed and provide short-term relief. Your health care provider will help you create an asthma action plan. An asthma action plan is a written plan for managing and treating your asthma attacks. It includes a list of your asthma triggers and how they may be avoided. It also includes information on when medicines should be taken and when their dosage should be changed. An action plan may also involve the use of a device called a peak flow meter. A peak flow meter measures how well the lungs are working. It helps you monitor your condition. HOME CARE INSTRUCTIONS   Take medicines only as directed by your health care provider. Speak with your health care provider if you have questions about how or when to take the medicines.  Use a peak flow meter as directed by your health care provider. Record and keep track of readings.  Understand and use the action plan to help minimize or stop an asthma attack without needing to seek medical care.  Control your home environment in the following ways  to help prevent asthma attacks:  Do not smoke. Avoid being exposed to secondhand smoke.  Change your heating and air conditioning filter regularly.  Limit your use of fireplaces and wood stoves.  Get rid of pests (such as  roaches and mice) and their droppings.  Throw away plants if you see mold on them.  Clean your floors and dust regularly. Use unscented cleaning products.  Try to have someone else vacuum for you regularly. Stay out of rooms while they are being vacuumed and for a short while afterward. If you vacuum, use a dust mask from a hardware store, a double-layered or microfilter vacuum cleaner bag, or a vacuum cleaner with a HEPA filter.  Replace carpet with wood, tile, or vinyl flooring. Carpet can trap dander and dust.  Use allergy-proof pillows, mattress covers, and box spring covers.  Wash bed sheets and blankets every week in hot water and dry them in a dryer.  Use blankets that are made of polyester or cotton.  Clean bathrooms and kitchens with bleach. If possible, have someone repaint the walls in these rooms with mold-resistant paint. Keep out of the rooms that are being cleaned and painted.  Wash hands frequently. SEEK MEDICAL CARE IF:   You have wheezing, shortness of breath, or a cough even if taking medicine to prevent attacks.  The colored mucus you cough up (sputum) is thicker than usual.  Your sputum changes from clear or white to yellow, green, gray, or bloody.  You have any problems that may be related to the medicines you are taking (such as a rash, itching, swelling, or trouble breathing).  You are using a reliever medicine more than 2-3 times per week.  Your peak flow is still at 50-79% of your personal best after following your action plan for 1 hour.  You have a fever. SEEK IMMEDIATE MEDICAL CARE IF:   You seem to be getting worse and are unresponsive to treatment during an asthma attack.  You are short of breath even at rest.  You get short of breath when doing very little physical activity.  You have difficulty eating, drinking, or talking due to asthma symptoms.  You develop chest pain.  You develop a fast heartbeat.  You have a bluish color to your  lips or fingernails.  You are light-headed, dizzy, or faint.  Your peak flow is less than 50% of your personal best.   This information is not intended to replace advice given to you by your health care provider. Make sure you discuss any questions you have with your health care provider.   Document Released: 04/02/2005 Document Revised: 12/22/2014 Document Reviewed: 10/30/2012 Elsevier Interactive Patient Education [employer]

## 2015-06-27 NOTE — ED Notes (Signed)
Ambulated Patient Approx. 200 ft. Maintained 100% SpO2 throughout and Pulse was 85 bpm at the end.

## 2015-07-10 ENCOUNTER — Emergency Department (HOSPITAL_COMMUNITY)
Admission: EM | Admit: 2015-07-10 | Discharge: 2015-07-10 | Disposition: A | Discharge status: Home / Self Care | Payer: Managed Care, Other (non HMO) | Attending: Emergency Medicine | Admitting: Emergency Medicine

## 2015-07-10 ENCOUNTER — Encounter (HOSPITAL_COMMUNITY): Payer: Self-pay | Admitting: *Deleted

## 2015-07-10 DIAGNOSIS — W268XXA Contact with other sharp object(s), not elsewhere classified, initial encounter: Secondary | ICD-10-CM | POA: Insufficient documentation

## 2015-07-10 DIAGNOSIS — S61412A Laceration without foreign body of left hand, initial encounter: Secondary | ICD-10-CM | POA: Insufficient documentation

## 2015-07-10 DIAGNOSIS — H9191 Unspecified hearing loss, right ear: Secondary | ICD-10-CM | POA: Diagnosis not present

## 2015-07-10 DIAGNOSIS — Y998 Other external cause status: Secondary | ICD-10-CM | POA: Insufficient documentation

## 2015-07-10 DIAGNOSIS — J45909 Unspecified asthma, uncomplicated: Secondary | ICD-10-CM | POA: Diagnosis not present

## 2015-07-10 DIAGNOSIS — Y9389 Activity, other specified: Secondary | ICD-10-CM | POA: Insufficient documentation

## 2015-07-10 DIAGNOSIS — Z87891 Personal history of nicotine dependence: Secondary | ICD-10-CM | POA: Diagnosis not present

## 2015-07-10 DIAGNOSIS — Y9289 Other specified places as the place of occurrence of the external cause: Secondary | ICD-10-CM | POA: Insufficient documentation

## 2015-07-10 DIAGNOSIS — G4733 Obstructive sleep apnea (adult) (pediatric): Secondary | ICD-10-CM | POA: Diagnosis not present

## 2015-07-10 DIAGNOSIS — Z79899 Other long term (current) drug therapy: Secondary | ICD-10-CM | POA: Insufficient documentation

## 2015-07-10 MED ORDER — OXYCODONE-ACETAMINOPHEN 5-325 MG PO TABS
1.0000 | ORAL_TABLET | Freq: Once | ORAL | Status: AC
  Administered 2015-07-10: 1 via ORAL
  Filled 2015-07-10: qty 1

## 2015-07-10 MED ORDER — BACITRACIN ZINC 500 UNIT/GM EX OINT
TOPICAL_OINTMENT | Freq: Two times a day (BID) | CUTANEOUS | Status: DC
  Administered 2015-07-10: 1 via TOPICAL

## 2015-07-10 MED ORDER — OXYCODONE-ACETAMINOPHEN 5-325 MG PO TABS: 1.0000 | ORAL_TABLET | ORAL | Status: DC | PRN

## 2015-07-10 MED ORDER — NAPROXEN 500 MG PO TABS: 500.0000 mg | ORAL_TABLET | Freq: Two times a day (BID) | ORAL | Status: DC

## 2015-07-10 MED ORDER — BUPIVACAINE HCL (PF) 0.5 % IJ SOLN
10.0000 mL | Freq: Once | INTRAMUSCULAR | Status: AC
Start: 2015-07-10 — End: 2015-07-10
  Administered 2015-07-10: 10 mL
  Filled 2015-07-10: qty 10

## 2015-07-10 NOTE — ED Provider Notes (Signed)
CSN: [id number]     Arrival date & time 07/10/15  [id number] History  By signing my name below, I, Tanda RockersMargaux Venter, attest that this documentation has been prepared under the direction and in the presence of Jamine Wingate, PA-C. Electronically Signed: Tanda RockersMargaux Venter, ED Scribe. 07/10/2015. 10:01 AM.    Chief Complaint  Patient presents with  . Extremity Laceration   The history is provided by the patient. No language interpreter was used.     HPI Comments: Zachary Grant is a 40 y.o. male who presents to the Emergency Department complaining of laceration to the base of the left thumb that occurred PTA. Pt states that he was cutting a zip tie off with a box cutter when he accidentally cut the base of his thumb. Pt felt lightheaded after seeing his blood but states it has resolved now. Bleeding is controlled. He notes gradual onset, constant, 8/10, pain to the area as well. Denies neuro deficits or any other associated symptoms. Tetanus is up to date. He is not on any anticoagulants.    Past Medical History  Diagnosis Date  . Traumatic rupture of left Achilles tendon   . Mild asthma   . OSA on CPAP     per study  05-04-2013 mild osa  . Wears contact lenses   . Hearing loss of right ear     Being worked up by ENT 01/2014  . Bilateral headaches     Being worked up by neurology - 01/2014  . Headache disorder 02/10/2014   Past Surgical History  Procedure Laterality Date  . Achilles tendon repair Left 2014    and removal spur  . Tonsillectomy  age 40  . Cardiovascular stress test  05-25-2011   DR Jacinto HalimGANJI    NORMAL PERFUSION/  NORMAL EF  . Achilles tendon surgery Left 09/04/2013    Procedure: LEFT ACHILLES TENDON REPAIR ;  Surgeon: Ludger NuttingN'Tuma Jah, MD;  Location: [facility];  Service: Podiatry;  Laterality: Left;   Family History  Problem Relation Age of Onset  . Asthma Mother   . Depression Brother    Social History  Substance Use Topics  . Smoking status: Former Smoker -- 1.00  packs/day for 7 years    Types: Cigarettes    Quit date: 06/03/2008  . Smokeless tobacco: Never Used  . Alcohol Use: No    Review of Systems  Musculoskeletal: Positive for arthralgias (left thumb).  Skin: Positive for wound (laceration to left thumb).   Allergies  Gabapentin  Home Medications   Prior to Admission medications   Medication Sig Start Date End Date Taking? Authorizing Provider  albuterol (PROVENTIL HFA;VENTOLIN HFA) 108 (90 BASE) MCG/ACT inhaler Inhale 1-2 puffs into the lungs every 6 (six) hours as needed for wheezing or shortness of breath.    Historical Provider, MD  albuterol (PROVENTIL HFA;VENTOLIN HFA) 108 (90 Base) MCG/ACT inhaler Inhale 1-2 puffs into the lungs every 6 (six) hours as needed for wheezing or shortness of breath. 06/27/15   Stevi Barrett, PA-C  ibuprofen (ADVIL,MOTRIN) 800 MG tablet Take 800 mg by mouth every 6 (six) hours as needed for moderate pain.  08/26/13   Cathlyn ParsonsAngela M Kabbe, NP  Multiple Vitamin (MULTIVITAMIN WITH MINERALS) TABS tablet Take 1 tablet by mouth daily.    Historical Provider, MD  naproxen (NAPROSYN) 500 MG tablet Take 1 tablet (500 mg total) by mouth 2 (two) times daily. 07/10/15   Andreika Vandagriff C Tobi Groesbeck, PA-C  oxyCODONE-acetaminophen (PERCOCET/ROXICET) 5-325 MG tablet Take 1 tablet  by mouth every 4 (four) hours as needed for severe pain. 07/10/15   Thaily Hackworth C Rondrick Barreira, PA-C  predniSONE (DELTASONE) 50 MG tablet Take one table once a day for 5 days 06/27/15   Rolm Gala Barrett, PA-C  traMADol (ULTRAM) 50 MG tablet Take 1 tablet (50 mg total) by mouth every 6 (six) hours as needed. Must last 28 days. 05/19/15   York Spaniel, MD   BP 123/63 mmHg  Pulse 65  Temp(Src) 97.5 F (36.4 C) (Oral)  Resp 18  SpO2 100%   Physical Exam  Constitutional: He is oriented to person, place, and time. He appears well-developed and well-nourished. No distress.  HENT:  Head: Normocephalic and atraumatic.  Eyes: Conjunctivae are normal.  Neck: Neck supple.  Cardiovascular:  Normal rate.   Pulmonary/Chest: Effort normal. No respiratory distress.  Musculoskeletal:  2 cm laceration on the volar side at the base of the left thumb.  No active hemorrhage.  Pt is able to fully articulate the left thumb.   Neurological: He is alert and oriented to person, place, and time.  No sensory deficits.  Strength is 5/5.   Skin: Skin is warm and dry.  Psychiatric: He has a normal mood and affect. His behavior is normal.  Nursing note and vitals reviewed.   ED Course  .Marland KitchenLaceration Repair Date/Time: 07/10/2015 11:38 AM Performed by: Anselm Pancoast Authorized by: Anselm Pancoast Consent: Verbal consent obtained. Risks and benefits: risks, benefits and alternatives were discussed Consent given by: patient Patient understanding: patient states understanding of the procedure being performed Patient consent: the patient's understanding of the procedure matches consent given Procedure consent: procedure consent matches procedure scheduled Patient identity confirmed: verbally with patient and arm band Body area: upper extremity Location details: left hand Laceration length: 2 cm Foreign bodies: no foreign bodies Tendon involvement: none Nerve involvement: none Vascular damage: no Anesthesia: local infiltration Local anesthetic: bupivacaine 0.5% without epinephrine Anesthetic total: 2 ml Patient sedated: no Preparation: Patient was prepped and draped in the usual sterile fashion. Irrigation solution: tap water Irrigation method: tap Amount of cleaning: standard Debridement: none Degree of undermining: none Skin closure: 4-0 Prolene Subcutaneous closure: 4-0 Vicryl Number of sutures: 4 Technique: simple, horizontal mattress and retention suture (1 simple interrupted, 1 deep dermal, 2 horizontal mattress) Approximation: close Approximation difficulty: complex Dressing: 4x4 sterile gauze and antibiotic ointment Patient tolerance: Patient tolerated the procedure well with  no immediate complications   (including critical care time)  DIAGNOSTIC STUDIES: Oxygen Saturation is 100% on RA, normal by my interpretation.    COORDINATION OF CARE: 10:01 AM-Discussed treatment plan which includes suture placement with pt at bedside and pt agreed to plan.   Labs Review Labs Reviewed - No data to display  Imaging Review No results found.   EKG Interpretation None           MDM   Final diagnoses:  Hand laceration, left, initial encounter   Zachary Grant presents with a laceration to his left hand that occurred just prior to arrival.  No evidence of damage to deeper structures. Patient has no functional or neuro deficits. Bleeding is controlled. Patient tolerated laceration care well. Patient to return in 9 days for suture removal. Patient advised to return sooner should signs of infection or dehiscence occur. Patient voiced understanding of these instructions and is comfortable with discharge.  I personally performed the services described in this documentation, which was scribed in my presence. The recorded information has been reviewed and is accurate.  Anselm Pancoast, PA-C 07/10/15 1150  Laurence Spates, MD 07/11/15 0800

## 2015-07-10 NOTE — ED Notes (Signed)
Declined W/C at D/C and was escorted to lobby by RN. 

## 2015-07-10 NOTE — Discharge Instructions (Signed)
You have been seen today for a hand laceration. Return to the ED in 9 days for suture removal. Follow the enclosed instructions for wound care. Return to the ED sooner should signs of infection or dehiscence (wound coming apart) arise. Follow up with PCP as needed. Return to ED should symptoms worsen.

## 2015-07-10 NOTE — ED Notes (Signed)
PT reports he cu the spce between the LT index finger and lt thumb while cutting a zip tie this AM.

## 2015-07-13 ENCOUNTER — Ambulatory Visit (INDEPENDENT_AMBULATORY_CARE_PROVIDER_SITE_OTHER): Payer: Managed Care, Other (non HMO) | Admitting: Adult Health

## 2015-07-13 ENCOUNTER — Encounter: Payer: Self-pay | Admitting: Adult Health

## 2015-07-13 VITALS — BP 138/82 | HR 82 | Resp 20 | Ht 68.0 in | Wt 196.0 lb

## 2015-07-13 DIAGNOSIS — R51 Headache: Secondary | ICD-10-CM | POA: Diagnosis not present

## 2015-07-13 DIAGNOSIS — R519 Headache, unspecified: Secondary | ICD-10-CM

## 2015-07-13 MED ORDER — LAMOTRIGINE 25 MG PO TABS: 25.0000 mg | ORAL_TABLET | Freq: Two times a day (BID) | ORAL | Status: DC

## 2015-07-13 MED ORDER — INDOMETHACIN ER 75 MG PO CPCR: 150.0000 mg | ORAL_CAPSULE | Freq: Two times a day (BID) | ORAL | Status: DC

## 2015-07-13 NOTE — Progress Notes (Signed)
I have read the note, and I agree with the clinical assessment and plan.  WILLIS,CHARLES KEITH   

## 2015-07-13 NOTE — Progress Notes (Signed)
PATIENT: Zachary Grant DOB: [birth year]  REASON FOR VISIT: follow up- headache HISTORY FROM: patient  HISTORY OF PRESENT ILLNESS: Zachary Grant is a 40 year old male with a history of right frontotemporal headache. At the last visit he was started on indomethacin 75 mg twice a day as well as Lamictal 25 mg daily. He reports that his headaches are not as frequent. His headaches still occur on the right frontotemporal region. He denies photophobia and phonophobia. No nausea or vomiting. He describes the headache as a sharp shooting pain that can last up to 15 minutes. He states occasionally after the sharp shooting pains he will have a dull headache. He states the headaches can occur throughout the day or at night. He states that he's been taking Ultram 3 times a day and feels that this has prevented a lot of his headaches. Occasionally he will have pain in the neck at the base of the skull however this is not associated with his headaches. He returns today for an evaluation.  HISTORY 03/15/15 (WILLIS):Zachary Grant is a 40 year old right-handed white male with a history of a predominantly right frontotemporal headache. The patient indicates that he has headaches daily in nature, but he may have 2 or 3 separate individual headaches lasting one or 2 hours. The patient indicates that the headaches are not associated with a throbbing sensation, but they are a sharp shooting pain. The patient has no photophobia, phonophobia, or nausea or vomiting with the headache. The headaches have been present for greater than one year, and came on in association with deafness of the right ear. A thorough workup has not shown an etiology for the headache. He had a minimal response with Topamax, but Ultram or Percocet will eliminate the headache.The patient is currently off of the Topamax, he comes to this office for an evaluation. He denies any neck pain or neck stiffness with the headache.    REVIEW OF SYSTEMS: Out of a complete  14 system review of symptoms, the patient complains only of the following symptoms, and all other reviewed systems are negative.  Hearing loss, ringing in ears, headache, neck pain,     ALLERGIES: Allergies  Allergen Reactions  . Gabapentin     Irritability    HOME MEDICATIONS: Outpatient Prescriptions Prior to Visit  Medication Sig Dispense Refill  . albuterol (PROVENTIL HFA;VENTOLIN HFA) 108 (90 BASE) MCG/ACT inhaler Inhale 1-2 puffs into the lungs every 6 (six) hours as needed for wheezing or shortness of breath.    Marland Kitchen albuterol (PROVENTIL HFA;VENTOLIN HFA) 108 (90 Base) MCG/ACT inhaler Inhale 1-2 puffs into the lungs every 6 (six) hours as needed for wheezing or shortness of breath. 1 Inhaler 0  . ibuprofen (ADVIL,MOTRIN) 800 MG tablet Take 800 mg by mouth every 6 (six) hours as needed for moderate pain.     . Multiple Vitamin (MULTIVITAMIN WITH MINERALS) TABS tablet Take 1 tablet by mouth daily.    . naproxen (NAPROSYN) 500 MG tablet Take 1 tablet (500 mg total) by mouth 2 (two) times daily. 30 tablet 0  . traMADol (ULTRAM) 50 MG tablet Take 1 tablet (50 mg total) by mouth every 6 (six) hours as needed. Must last 28 days. 90 tablet 3  . oxyCODONE-acetaminophen (PERCOCET/ROXICET) 5-325 MG tablet Take 1 tablet by mouth every 4 (four) hours as needed for severe pain. 8 tablet 0  . predniSONE (DELTASONE) 50 MG tablet Take one table once a day for 5 days 5 tablet 0   No facility-administered medications  prior to visit.    PAST MEDICAL HISTORY: Past Medical History  Diagnosis Date  . Traumatic rupture of left Achilles tendon   . Mild asthma   . OSA on CPAP     per study  05-04-2013 mild osa  . Wears contact lenses   . Hearing loss of right ear     Being worked up by ENT 01/2014  . Bilateral headaches     Being worked up by neurology - 01/2014  . Headache disorder 02/10/2014    PAST SURGICAL HISTORY: Past Surgical History  Procedure Laterality Date  . Achilles tendon  repair Left 2014    and removal spur  . Tonsillectomy  age 67  . Cardiovascular stress test  05-25-2011   DR Jacinto Halim    NORMAL PERFUSION/  NORMAL EF  . Achilles tendon surgery Left 09/04/2013    Procedure: LEFT ACHILLES TENDON REPAIR ;  Surgeon: Ludger Nutting, MD;  Location: [facility];  Service: Podiatry;  Laterality: Left;    FAMILY HISTORY: Family History  Problem Relation Age of Onset  . Asthma Mother   . Depression Brother     SOCIAL HISTORY: Social History   Social History  . Marital Status: Married    Spouse Name: Ashlee  . Number of Children: 2  . Years of Education: 12+   Occupational History  . maintenance technician     [employer]   Social History Main Topics  . Smoking status: Former Smoker -- 1.00 packs/day for 7 years    Types: Cigarettes    Quit date: 06/03/2008  . Smokeless tobacco: Never Used  . Alcohol Use: No  . Drug Use: No  . Sexual Activity: Not on file   Other Topics Concern  . Not on file   Social History Narrative   Patient lives wife and her grandparents.   Patient is right handed   Patient drinks about 2 cups of caffeine daily.   His two children from a previous relationship live with their mother.      Maternal grandfather rheumatoid arthritis, Maternal grandfather had neurosarcoid.      PHYSICAL EXAM  Filed Vitals:   07/13/15 0731  BP: 138/82  Pulse: 82  Resp: 20  Height:  (1.727 m)  Weight: 196 lb (88.905 kg)   Body mass index is 29.81 kg/(m^2).  Generalized: Well developed, in no acute distress   Neurological examination  Mentation: Alert oriented to time, place, history taking. Follows all commands speech and language fluent Cranial nerve II-XII: Pupils were equal round reactive to light. Extraocular movements were full, visual field were full on confrontational test. Facial sensation and strength were normal. Uvula tongue midline. Head turning and shoulder shrug  were normal and symmetric. Motor:  The motor testing reveals 5 over 5 strength of all 4 extremities. Good symmetric motor tone is noted throughout. Left hand wrapped due to laceration. Sensory: Sensory testing is intact to soft touch on all 4 extremities. No evidence of extinction is noted.  Coordination: Cerebellar testing reveals good finger-nose-finger and heel-to-shin bilaterally.  Gait and station: Gait is normal. Tandem gait is normal. Romberg is negative. No drift is seen.  Reflexes: Deep tendon reflexes are symmetric and normal bilaterally.   DIAGNOSTIC DATA (LABS, IMAGING, TESTING) - I reviewed patient records, labs, notes, testing and imaging myself where available.       ASSESSMENT AND PLAN 40 y.o. year old male  has a past medical history of Traumatic rupture of left Achilles  tendon; Mild asthma; OSA on CPAP; Wears contact lenses; Hearing loss of right ear; Bilateral headaches; and Headache disorder (02/10/2014). here with:  1. Headache   The patient does feel that his headache frequency has improved slightly. According to Dr. Anne HahnWillis last note if headache had not improved he wants to increase the indomethacin. The patient will now take indomethacin 75 mg 2 tablets twice a day. We will also increase Lamictal to 25 mg twice a day. The patient is urged to only use Ultram for acute treatment. Patient verbalized understanding. He will follow-up in 4-5 months or sooner if needed.   Butch PennyMegan Anderson Middlebrooks, MSN, NP-C 07/13/2015, 8:12 AM [facility] [address], [address] [place], [place] [address] [phone]

## 2015-07-13 NOTE — Patient Instructions (Signed)
Increase Indomethacin 75 mg 2 tablets twice a day and lamictal 25 mg 1 tablet twice a day If your symptoms worsen or you develop new symptoms please let us know.

## 2015-07-18 ENCOUNTER — Ambulatory Visit (INDEPENDENT_AMBULATORY_CARE_PROVIDER_SITE_OTHER): Payer: Managed Care, Other (non HMO) | Admitting: Family Medicine

## 2015-07-18 VITALS — BP 125/83 | HR 90 | Temp 97.9°F | Resp 16 | Ht 69.0 in | Wt 192.0 lb

## 2015-07-18 DIAGNOSIS — S61002D Unspecified open wound of left thumb without damage to nail, subsequent encounter: Secondary | ICD-10-CM | POA: Diagnosis not present

## 2015-07-18 DIAGNOSIS — S61012D Laceration without foreign body of left thumb without damage to nail, subsequent encounter: Secondary | ICD-10-CM

## 2015-07-18 NOTE — Patient Instructions (Addendum)
  The left thumb webspace is well healed with not redness along the wound margins.  Sutures removed without  Problem.    Good range of motion of thumb and good sensation.  May return to work.

## 2015-07-18 NOTE — Progress Notes (Signed)
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  No problems since the repair done in the ED.  Tdap:  2014  Objective:  NAD The left thumb webspace is well healed with not redness along the wound margins.  Sutures removed without  Problem.    Good range of motion of thumb and good sensation.  May return to work.  Laceration of left thumb, subsequent encounter  Ladona HornsKLauenstein, MD

## 2015-07-20 ENCOUNTER — Telehealth: Payer: Self-pay

## 2015-07-20 NOTE — Telephone Encounter (Signed)
Pre Visit call completed. 

## 2015-07-21 ENCOUNTER — Ambulatory Visit: Payer: BLUE CROSS/BLUE SHIELD | Admitting: Family Medicine

## 2015-07-21 DIAGNOSIS — Z0289 Encounter for other administrative examinations: Secondary | ICD-10-CM

## 2015-08-26 ENCOUNTER — Other Ambulatory Visit: Payer: Self-pay | Admitting: Neurology

## 2015-08-29 ENCOUNTER — Other Ambulatory Visit: Payer: Self-pay | Admitting: Neurology

## 2015-08-30 ENCOUNTER — Encounter: Payer: Self-pay | Admitting: Neurology

## 2015-08-30 ENCOUNTER — Other Ambulatory Visit: Payer: Self-pay | Admitting: Neurology

## 2015-08-30 MED ORDER — TRAMADOL HCL 50 MG PO TABS: 50.0000 mg | ORAL_TABLET | Freq: Four times a day (QID) | ORAL | Status: DC | PRN

## 2015-11-14 ENCOUNTER — Ambulatory Visit: Payer: Managed Care, Other (non HMO) | Admitting: Adult Health

## 2015-11-15 ENCOUNTER — Encounter: Payer: Self-pay | Admitting: Adult Health

## 2015-11-30 ENCOUNTER — Encounter: Payer: Self-pay | Admitting: Adult Health

## 2015-12-04 ENCOUNTER — Ambulatory Visit (INDEPENDENT_AMBULATORY_CARE_PROVIDER_SITE_OTHER): Payer: Self-pay

## 2015-12-04 ENCOUNTER — Ambulatory Visit (HOSPITAL_COMMUNITY)
Admission: EM | Admit: 2015-12-04 | Discharge: 2015-12-04 | Disposition: A | Discharge status: Home / Self Care | Payer: BLUE CROSS/BLUE SHIELD | Attending: Internal Medicine | Admitting: Internal Medicine

## 2015-12-04 ENCOUNTER — Encounter (HOSPITAL_COMMUNITY): Payer: Self-pay | Admitting: Nurse Practitioner

## 2015-12-04 DIAGNOSIS — M25559 Pain in unspecified hip: Secondary | ICD-10-CM

## 2015-12-04 MED ORDER — INDOMETHACIN ER 75 MG PO CPCR: 75.0000 mg | ORAL_CAPSULE | Freq: Two times a day (BID) | ORAL | 1 refills | Status: DC

## 2015-12-04 NOTE — ED Provider Notes (Signed)
CSN: [id number]     Arrival date & time 12/04/15  1203 History   First MD Initiated Contact with Patient 12/04/15 1248     Chief Complaint  Patient presents with  . Hip Pain   (Consider location/radiation/quality/duration/timing/severity/associated sxs/prior Treatment) HPI 40 year old male with bilateral hip pain. Patient states that he has had spurs of the right hip diagnosed several years ago was sent to physical therapy with minimal relief. He states he has now the same similar pain in the left hip. He is requesting x-ray of the hips see progression or development of the spurs. He is also requesting a referral to orthopedics. Pain score is a 4 he has been using ibuprofen and symptomatic relief of symptoms at home. Past Medical History:  Diagnosis Date  . Bilateral headaches    Being worked up by neurology - 01/2014  . Headache disorder 02/10/2014  . Hearing loss of right ear    Being worked up by ENT 01/2014  . Mild asthma   . OSA on CPAP    per study  05-04-2013 mild osa  . Traumatic rupture of left Achilles tendon   . Wears contact lenses    Past Surgical History:  Procedure Laterality Date  . ACHILLES TENDON REPAIR Left 2014   and removal spur  . ACHILLES TENDON SURGERY Left 09/04/2013   Procedure: LEFT ACHILLES TENDON REPAIR ;  Surgeon: Ludger NuttingN'Tuma Jah, MD;  Location: [facility];  Service: Podiatry;  Laterality: Left;  . CARDIOVASCULAR STRESS TEST  05-25-2011   DR Jacinto HalimGANJI   NORMAL PERFUSION/  NORMAL EF  . TONSILLECTOMY  age 40   Family History  Problem Relation Age of Onset  . Asthma Mother   . Depression Brother    Social History  Substance Use Topics  . Smoking status: Former Smoker    Packs/day: 1.00    Years: 7.00    Types: Cigarettes    Quit date: 06/03/2008  . Smokeless tobacco: Never Used  . Alcohol use No    Review of Systems  Denies: HEADACHE, NAUSEA, ABDOMINAL PAIN, CHEST PAIN, CONGESTION, DYSURIA, SHORTNESS OF BREATH  Allergies   Gabapentin  Home Medications   Prior to Admission medications   Medication Sig Start Date End Date Taking? Authorizing Provider  albuterol (PROVENTIL HFA;VENTOLIN HFA) 108 (90 BASE) MCG/ACT inhaler Inhale 1-2 puffs into the lungs every 6 (six) hours as needed for wheezing or shortness of breath.    Historical Provider, MD  ibuprofen (ADVIL,MOTRIN) 800 MG tablet Take 800 mg by mouth every 6 (six) hours as needed for moderate pain.  08/26/13   Cathlyn ParsonsAngela M Kabbe, NP  indomethacin (INDOCIN SR) 75 MG CR capsule Take 2 capsules (150 mg total) by mouth 2 (two) times daily with a meal. Patient not taking: Reported on 07/20/2015 07/13/15   Butch PennyMegan Millikan, NP  lamoTRIgine (LAMICTAL) 25 MG tablet Take 1 tablet (25 mg total) by mouth 2 (two) times daily. Patient not taking: Reported on 07/18/2015 07/13/15   Butch PennyMegan Millikan, NP  Multiple Vitamin (MULTIVITAMIN WITH MINERALS) TABS tablet Take 1 tablet by mouth daily.    Historical Provider, MD  naproxen (NAPROSYN) 500 MG tablet Take 1 tablet (500 mg total) by mouth 2 (two) times daily. Patient not taking: Reported on 07/18/2015 07/10/15   Shawn C Joy, PA-C  traMADol (ULTRAM) 50 MG tablet Take 1 tablet (50 mg total) by mouth every 6 (six) hours as needed. Must last 28 days. 08/30/15   York Spanielharles K Willis, MD   Meds  Ordered and Administered this Visit  Medications - No data to display  BP 127/91   Pulse 86   Temp 98.3 F (36.8 C) (Oral)   Resp 18   SpO2 98%  No data found.   Physical Exam NURSES NOTES AND VITAL SIGNS REVIEWED. CONSTITUTIONAL: Well developed, well nourished, no acute distress HEENT: normocephalic, atraumatic EYES: Conjunctiva normal NECK:normal ROM, supple, no adenopathy PULMONARY:No respiratory distress, normal effort ABDOMINAL: Soft, ND, NT BS+, No CVAT MUSCULOSKELETAL: Normal ROM of all extremities, left and right hips reveal no tenderness. Full range of motion. No trochanteric tenderness left or right side. SKIN: warm and dry without  rash PSYCHIATRIC: Mood and affect, behavior are normal  Urgent Care Course   Clinical Course    Procedures (including critical care time)  Labs Review Labs Reviewed - No data to display  Imaging Review Dg Pelvis 1-2 Views  Result Date: 12/04/2015 CLINICAL DATA:  Left hip pain over the last month. EXAM: PELVIS - 1-2 VIEW COMPARISON:  07/03/2013 FINDINGS: No joint space narrowing on either side. Labral calcifications bilaterally are normal variants. No degenerative change or focal lesion. Sacroiliac joints are normal. IMPRESSION: Normal radiograph.  No change since 07/03/2013. Electronically Signed   By: Paulina FusiMark  Shogry M.D.   On: 12/04/2015 13:58    X-ray is reviewed with patient prior to discharge. Visual Acuity Review  Right Eye Distance:   Left Eye Distance:   Bilateral Distance:    Right Eye Near:   Left Eye Near:    Bilateral Near:       40 year old male with bilateral hip pain. No trauma. X-rays reveal NO WORSENING OF RIGHT HIP SPURS, LEFT LOOKS NORMAL referral to on-call orthopedics for follow-up continue symptomatic treatment at home  INDOCIN FOR ANTIINFLAM/PAIN MDM   1. Hip pain, unspecified laterality     Patient is reassured that there are no issues that require transfer to higher level of care at this time or additional tests. Patient is advised to continue home symptomatic treatment. Patient is advised that if there are new or worsening symptoms to attend the emergency department, contact primary care provider, or return to UC. Instructions of care provided discharged home in stable condition.    THIS NOTE WAS GENERATED USING A VOICE RECOGNITION SOFTWARE PROGRAM. ALL REASONABLE EFFORTS  WERE MADE TO PROOFREAD THIS DOCUMENT FOR ACCURACY.  I have verbally reviewed the discharge instructions with the patient. A printed AVS was given to the patient.  All questions were answered prior to discharge.      Tharon AquasFrank C Patrick, PA 12/04/15 2048    Tharon AquasFrank C Patrick,  [place] 12/04/15 2049

## 2015-12-04 NOTE — ED Triage Notes (Signed)
Pt c/o several week history of bilateral hip pain. He reports he is unable to sit for long times and wakes at night due to the pain. He has tried advil, tylenol, icy hot with minimal relief. He reports ortho workup and PT In 2015 for similar pain. He is ambulatory, mae.

## 2015-12-05 ENCOUNTER — Other Ambulatory Visit: Payer: Self-pay | Admitting: Neurology

## 2015-12-08 ENCOUNTER — Encounter: Payer: Self-pay | Admitting: Neurology

## 2015-12-08 DIAGNOSIS — Z0289 Encounter for other administrative examinations: Secondary | ICD-10-CM

## 2015-12-12 ENCOUNTER — Encounter: Payer: Self-pay | Admitting: Adult Health

## 2015-12-12 ENCOUNTER — Other Ambulatory Visit: Payer: Self-pay | Admitting: Neurology

## 2015-12-12 ENCOUNTER — Ambulatory Visit (INDEPENDENT_AMBULATORY_CARE_PROVIDER_SITE_OTHER): Payer: Self-pay | Admitting: Adult Health

## 2015-12-12 VITALS — BP 141/90 | HR 92 | Ht 69.0 in | Wt 194.4 lb

## 2015-12-12 DIAGNOSIS — R519 Headache, unspecified: Secondary | ICD-10-CM

## 2015-12-12 DIAGNOSIS — R51 Headache: Secondary | ICD-10-CM

## 2015-12-12 MED ORDER — LAMOTRIGINE 25 MG PO TABS: 25.0000 mg | ORAL_TABLET | Freq: Two times a day (BID) | ORAL | 5 refills | Status: DC

## 2015-12-12 MED ORDER — INDOMETHACIN ER 75 MG PO CPCR: 75.0000 mg | ORAL_CAPSULE | Freq: Two times a day (BID) | ORAL | 1 refills | Status: DC

## 2015-12-12 NOTE — Progress Notes (Signed)
I have read the note, and I agree with the clinical assessment and plan.  Francesca Strome KEITH   

## 2015-12-12 NOTE — Patient Instructions (Signed)
Restart indomethacin and Lamictal If your symptoms worsen or you develop new symptoms please let us know.

## 2015-12-12 NOTE — Progress Notes (Signed)
PATIENT: Zachary Grant DOB: 1976-03-21  REASON FOR VISIT: follow up HISTORY FROM: patient  HISTORY OF PRESENT ILLNESS: Zachary Grant is a 40 year old male with a history of right frontotemporal headache. He returns today for follow-up. He reports that he is no longer taking indomethacin and Lamictal. He reports that he lost his job and therefore does not have insurance and was unable to get refills. He reports that he continues to have headaches in the right frontotemporal region. He reports that the headaches are typically a sharp pain that lasts for several minutes. He denies photophobia, phonophobia, nausea and vomiting. He states that these episodes can occur throughout the day. He does notice that they tend to get worse with changes in the barometric pressure. In the past he's had a complete workup that has been unremarkable. He also has tinnitus in the right ear as a result of hearing loss. He reports that the tone of the tinnitus changes with his headaches. At times he also does notice some neck stiffness. When he was taking indomethacin and Lamictal he did notice some benefit. He returns today for an evaluation.  HISTORY 07/13/15: Zachary Grant is a 40 year old male with a history of right frontotemporal headache. At the last visit he was started on indomethacin 75 mg twice a day as well as Lamictal 25 mg daily. He reports that his headaches are not as frequent. His headaches still occur on the right frontotemporal region. He denies photophobia and phonophobia. No nausea or vomiting. He describes the headache as a sharp shooting pain that can last up to 15 minutes. He states occasionally after the sharp shooting pains he will have a dull headache. He states the headaches can occur throughout the day or at night. He states that he's been taking Ultram 3 times a day and feels that this has prevented a lot of his headaches. Occasionally he will have pain in the neck at the base of the skull however this is  not associated with his headaches. He returns today for an evaluation.  HISTORY 03/15/15 (WILLIS):Zachary Grant is a 40 year old right-handed white male with a history of a predominantly right frontotemporal headache. The patient indicates that he has headaches daily in nature, but he may have 2 or 3 separate individual headaches lasting one or 2 hours. The patient indicates that the headaches are not associated with a throbbing sensation, but they are a sharp shooting pain. The patient has no photophobia, phonophobia, or nausea or vomiting with the headache. The headaches have been present for greater than one year, and came on in association with deafness of the right ear. A thorough workup has not shown an etiology for the headache. He had a minimal response with Topamax, but Ultram or Percocet will eliminate the headache.The patient is currently off of the Topamax, he comes to this office for an evaluation. He denies any neck pain or neck stiffness with the headache.    REVIEW OF SYSTEMS: Out of a complete 14 system review of symptoms, the patient complains only of the following symptoms, and all other reviewed systems are negative.  Hearing loss, ringing in ears, headache, neck pain  ALLERGIES: Allergies  Allergen Reactions  . Gabapentin     Irritability    HOME MEDICATIONS: Outpatient Medications Prior to Visit  Medication Sig Dispense Refill  . albuterol (PROVENTIL HFA;VENTOLIN HFA) 108 (90 BASE) MCG/ACT inhaler Inhale 1-2 puffs into the lungs every 6 (six) hours as needed for wheezing or shortness of breath.    Marland Kitchen  ibuprofen (ADVIL,MOTRIN) 800 MG tablet Take 800 mg by mouth every 6 (six) hours as needed for moderate pain.     . Multiple Vitamin (MULTIVITAMIN WITH MINERALS) TABS tablet Take 1 tablet by mouth daily.    . naproxen (NAPROSYN) 500 MG tablet Take 1 tablet (500 mg total) by mouth 2 (two) times daily. 30 tablet 0  . traMADol (ULTRAM) 50 MG tablet Take 1 tablet (50 mg total) by mouth  every 6 (six) hours as needed. Must last 28 days. 90 tablet 3  . indomethacin (INDOCIN SR) 75 MG CR capsule Take 2 capsules (150 mg total) by mouth 2 (two) times daily with a meal. (Patient not taking: Reported on 07/20/2015) 120 capsule 5  . indomethacin (INDOCIN SR) 75 MG CR capsule Take 1 capsule (75 mg total) by mouth 2 (two) times daily with a meal. (Patient not taking: Reported on 12/12/2015) 30 capsule 1  . lamoTRIgine (LAMICTAL) 25 MG tablet Take 1 tablet (25 mg total) by mouth 2 (two) times daily. (Patient not taking: Reported on 07/18/2015) 60 tablet 5   No facility-administered medications prior to visit.     PAST MEDICAL HISTORY: Past Medical History:  Diagnosis Date  . Bilateral headaches    Being worked up by neurology - 01/2014  . Headache disorder 02/10/2014  . Hearing loss of right ear    Being worked up by ENT 01/2014  . Mild asthma   . OSA on CPAP    per study  05-04-2013 mild osa  . Traumatic rupture of left Achilles tendon   . Wears contact lenses     PAST SURGICAL HISTORY: Past Surgical History:  Procedure Laterality Date  . ACHILLES TENDON REPAIR Left 2014   and removal spur  . ACHILLES TENDON SURGERY Left 09/04/2013   Procedure: LEFT ACHILLES TENDON REPAIR ;  Surgeon: Ludger NuttingN'Tuma Jah, MD;  Location: [facility];  Service: Podiatry;  Laterality: Left;  . CARDIOVASCULAR STRESS TEST  05-25-2011   DR Jacinto HalimGANJI   NORMAL PERFUSION/  NORMAL EF  . TONSILLECTOMY  age 40    FAMILY HISTORY: Family History  Problem Relation Age of Onset  . Asthma Mother   . Depression Brother     SOCIAL HISTORY: Social History   Social History  . Marital status: Married    Spouse name: Ashlee  . Number of children: 2  . Years of education: 12+   Occupational History  . 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    [employer]   Social History Main Topics  . Smoking status: Former Smoker    Packs/day: 1.00    Years: 7.00    Types: Cigarettes    Quit  date: 06/03/2008  . Smokeless tobacco: Never Used  . Alcohol use No  . Drug use: No  . Sexual activity: Not on file   Other Topics Concern  . Not on file   Social History Narrative   Patient lives wife and her grandparents.   Patient is right handed   Patient drinks about 2 cups of caffeine daily.   His two children from a previous relationship live with their mother.      Maternal grandfather rheumatoid arthritis, Maternal grandfather had neurosarcoid.      PHYSICAL EXAM  Vitals:   12/12/15 0902  BP: (!) 141/90  Pulse: 92  Weight: 194 lb 6.4 oz (88.2 kg)  Height: 5\' 9"  (1.753 m)   Body mass index is 28.71 kg/m.  Generalized: Well developed, in no  acute distress   Neurological examination  Mentation: Alert oriented to time, place, history taking. Follows all commands speech and language fluent Cranial nerve II-XII: Pupils were equal round reactive to light. Extraocular movements were full, visual field were full on confrontational test. Facial sensation and strength were normal. Uvula tongue midline. Head turning and shoulder shrug  were normal and symmetric. Motor: The motor testing reveals 5 over 5 strength of all 4 extremities. Good symmetric motor tone is noted throughout.  Sensory: Sensory testing is intact to soft touch on all 4 extremities. No evidence of extinction is noted.  Coordination: Cerebellar testing reveals good finger-nose-finger and heel-to-shin bilaterally.  Gait and station: Gait is normal. Tandem gait is normal. Romberg is negative. No drift is seen.  Reflexes: Deep tendon reflexes are symmetric and normal bilaterally.   DIAGNOSTIC DATA (LABS, IMAGING, TESTING) - I reviewed patient records, labs, notes, testing and imaging myself where available.  Lab Results  Component Value Date   WBC 5.2 02/16/2014   HGB 14.2 02/16/2014   HCT 41.5 02/16/2014   MCV 86.1 02/16/2014   PLT 208 02/16/2014      Component Value Date/Time   NA 141 02/16/2014  1826   K 4.0 02/16/2014 1826   CL 102 02/16/2014 1826   CO2 25 02/16/2014 1826   GLUCOSE 112 (H) 02/16/2014 1826   BUN 17 02/16/2014 1826   CREATININE 1.18 02/16/2014 1826   CALCIUM 9.4 02/16/2014 1826   PROT 7.6 04/25/2012 1036   ALBUMIN 4.3 02/26/2014 1320   AST 28 04/25/2012 1036   ALT 42 04/25/2012 1036   ALKPHOS 74 04/25/2012 1036   BILITOT 0.3 04/25/2012 1036   GFRNONAA 76 (L) 02/16/2014 1826   GFRAA 88 (L) 02/16/2014 1826   Lab Results  Component Value Date   CHOL 207 (H) 04/26/2011   HDL 28 (L) 04/26/2011   LDLCALC 138 (H) 04/26/2011   TRIG 203 (H) 04/26/2011   CHOLHDL 7.4 04/26/2011      ASSESSMENT AND PLAN 40 y.o. year old male  has a past medical history of Bilateral headaches; Headache disorder (02/10/2014); Hearing loss of right ear; Mild asthma; OSA on CPAP; Traumatic rupture of left Achilles tendon; and Wears contact lenses. here with:  1. Headache  The patient will restart on indomethacin 75 mg twice a day as well as Lamictal 25 mg twice a day. I have advised the patient that he should refrain from taking indomethacin and naproxen together. I've also explained the side effects/ risk associated with indomethacin. Patient verbalized understanding. If his headaches do not improve he should let us know. Will follow-up in 6 months with Dr. Anne Hahn.    Butch Penny, MSN, NP-C 12/12/2015, 9:39 AM [facility] [address] [place], [place] [address] [phone]

## 2015-12-13 MED ORDER — TRAMADOL HCL 50 MG PO TABS: 50.0000 mg | ORAL_TABLET | Freq: Four times a day (QID) | ORAL | 3 refills | Status: DC | PRN

## 2015-12-13 NOTE — Telephone Encounter (Signed)
[facility]  Received fax confirmation.

## 2015-12-13 NOTE — Telephone Encounter (Signed)
Prescription will be faxed today

## 2016-01-26 ENCOUNTER — Encounter: Payer: Self-pay | Admitting: Adult Health

## 2016-01-27 ENCOUNTER — Emergency Department (HOSPITAL_COMMUNITY)
Admission: EM | Admit: 2016-01-27 | Discharge: 2016-01-27 | Disposition: A | Discharge status: Home / Self Care | Payer: Self-pay | Attending: Emergency Medicine | Admitting: Emergency Medicine

## 2016-01-27 ENCOUNTER — Encounter (HOSPITAL_COMMUNITY): Payer: Self-pay

## 2016-01-27 DIAGNOSIS — G8929 Other chronic pain: Secondary | ICD-10-CM

## 2016-01-27 DIAGNOSIS — R51 Headache: Secondary | ICD-10-CM | POA: Insufficient documentation

## 2016-01-27 DIAGNOSIS — Z87891 Personal history of nicotine dependence: Secondary | ICD-10-CM | POA: Insufficient documentation

## 2016-01-27 MED ORDER — KETOROLAC TROMETHAMINE 60 MG/2ML IM SOLN
60.0000 mg | Freq: Once | INTRAMUSCULAR | Status: AC
  Administered 2016-01-27: 60 mg via INTRAMUSCULAR
  Filled 2016-01-27: qty 2

## 2016-01-27 MED ORDER — IBUPROFEN 800 MG PO TABS: 800.0000 mg | ORAL_TABLET | Freq: Four times a day (QID) | ORAL | 0 refills | Status: DC | PRN

## 2016-01-27 NOTE — ED Provider Notes (Signed)
[facility]-EMERGENCY DEPT Provider Note   CSN: [id number] Arrival date & time: 01/27/16  0904     History   Chief Complaint Chief Complaint  Patient presents with  . Neck Pain  . Headache    HPI Zachary Grant is a 40 y.o. male.  Patient is 40 yo M with PMH of chronic headaches, followed by neurologist Dr. Stephanie Acreharles Willis, presenting with sudden onset of neck/occipital pain that started at 4:00 yesterday afternoon. Reports hx of chronic headaches that started 2 years ago, associated with numbness and weakness at right side of face, and resulting in complete deafness of his right ear. States location of pain is similar to past headaches, but "more severe" with ringing in right ear only. Pain has been constant and he took nothing to relieve pain. Patient called neurology this morning, who instructed him to come in for evaluation. Denies any numbness, weakness, dizziness, visual changes, or slurred speech. No hx of stroke or ICH.      Past Medical History:  Diagnosis Date  . Bilateral headaches    Being worked up by neurology - 01/2014  . Headache disorder 02/10/2014  . Hearing loss of right ear    Being worked up by ENT 01/2014  . Mild asthma   . OSA on CPAP    per study  05-04-2013 mild osa  . Traumatic rupture of left Achilles tendon   . Wears contact lenses     Patient Active Problem List   Diagnosis Date Noted  . Hearing loss of right ear 07/14/2014  . Headache disorder 02/10/2014  . OSA on CPAP 02/06/2014  . Asthma, chronic 02/06/2014  . Achilles rupture, left 02/06/2014    Past Surgical History:  Procedure Laterality Date  . ACHILLES TENDON REPAIR Left 2014   and removal spur  . ACHILLES TENDON SURGERY Left 09/04/2013   Procedure: LEFT ACHILLES TENDON REPAIR ;  Surgeon: Ludger NuttingN'Tuma Jah, MD;  Location: [facility];  Service: Podiatry;  Laterality: Left;  . CARDIOVASCULAR STRESS TEST  05-25-2011   DR Jacinto HalimGANJI   NORMAL PERFUSION/  NORMAL EF  . TONSILLECTOMY  age  40       Home Medications    Prior to Admission medications   Medication Sig Start Date End Date Taking? Authorizing Provider  albuterol (PROVENTIL HFA;VENTOLIN HFA) 108 (90 BASE) MCG/ACT inhaler Inhale 1-2 puffs into the lungs every 6 (six) hours as needed for wheezing or shortness of breath.   Yes Historical Provider, MD  ibuprofen (ADVIL,MOTRIN) 800 MG tablet Take 800 mg by mouth every 6 (six) hours as needed for moderate pain.  08/26/13  Yes Cathlyn ParsonsAngela M Kabbe, NP  Multiple Vitamin (MULTIVITAMIN WITH MINERALS) TABS tablet Take 1 tablet by mouth daily.   Yes Historical Provider, MD  traMADol (ULTRAM) 50 MG tablet Take 1 tablet (50 mg total) by mouth every 6 (six) hours as needed. Must last 28 days. 12/13/15  Yes Butch PennyMegan Millikan, NP  indomethacin (INDOCIN SR) 75 MG CR capsule Take 1 capsule (75 mg total) by mouth 2 (two) times daily with a meal. Patient not taking: Reported on 01/27/2016 12/12/15   Butch PennyMegan Millikan, NP  lamoTRIgine (LAMICTAL) 25 MG tablet Take 1 tablet (25 mg total) by mouth 2 (two) times daily. Patient not taking: Reported on 01/27/2016 12/12/15   Butch PennyMegan Millikan, NP  naproxen (NAPROSYN) 500 MG tablet Take 1 tablet (500 mg total) by mouth 2 (two) times daily. Patient not taking: Reported on 01/27/2016 07/10/15   Anselm PancoastShawn C Joy, PA-C  Family History Family History  Problem Relation Age of Onset  . Asthma Mother   . Depression Brother     Social History Social History  Substance Use Topics  . Smoking status: Former Smoker    Packs/day: 1.00    Years: 7.00    Types: Cigarettes    Quit date: 06/03/2008  . Smokeless tobacco: Never Used  . Alcohol use Yes     Comment: Occasionally      Allergies   Gabapentin   Review of Systems Review of Systems  Constitutional: Negative for chills and fever.  HENT: Positive for tinnitus (right ear only). Negative for ear discharge, ear pain and sore throat.   Eyes: Negative for pain and visual disturbance.  Respiratory: Negative  for cough and shortness of breath.   Cardiovascular: Negative for chest pain, palpitations and leg swelling.  Gastrointestinal: Negative for abdominal pain, blood in stool, nausea and vomiting.  Genitourinary: Negative for dysuria, flank pain and hematuria.  Musculoskeletal: Positive for neck pain (pain at neck/occipital region). Negative for back pain and gait problem.  Skin: Negative for color change and rash.  Neurological: Positive for headaches (chronic). Negative for dizziness, seizures, syncope, weakness and numbness.     Physical Exam Updated Vital Signs BP 114/80   Pulse 69   Temp 98 F (36.7 C) (Oral)   Resp 16   Ht 5\' 8"  (1.727 m)   Wt 88.5 kg   SpO2 97%   BMI 29.65 kg/m   Physical Exam  Constitutional: He is oriented to person, place, and time. He appears well-developed and well-nourished. No distress.  Sitting comfortably in bed.  HENT:  Head: Normocephalic and atraumatic.  Right Ear: External ear normal.  Left Ear: External ear normal.  Mouth/Throat: Oropharynx is clear and moist.  TMs and ear canals normal bilaterally. No nasal mucosal edema or erythema. No frontal or maxillary sinus tenderness.  Eyes: Conjunctivae and EOM are normal. Pupils are equal, round, and reactive to light.  Neck: Normal range of motion. Neck supple.  No midline cervical tenderness, crepitus, or deformity. No TTP of paraspinal or trapezius musculature. Pain located at C1/occiput not reproducible on palpation.  Cardiovascular: Normal rate, regular rhythm, normal heart sounds and intact distal pulses.   Pulmonary/Chest: Effort normal and breath sounds normal. No respiratory distress.  Abdominal: Soft. There is no tenderness.  Musculoskeletal: Normal range of motion. He exhibits no edema or tenderness.  Neurological: He is alert and oriented to person, place, and time.  Speech is clear and goal oriented, follows commands. Cranial nerves III - XII without deficit, no facial  droop. Normal strength in upper and lower extremities bilaterally, strong and equal grip strength. Sensation normal to light and sharp touch. Moves extremities without ataxia, coordination intact. Normal finger to nose and rapid alternating movements. Neg Romberg, no pronator drift. Normal gait.  Skin: Skin is warm and dry.  Psychiatric: He has a normal mood and affect.  Nursing note and vitals reviewed.    ED Treatments / Results  Labs (all labs ordered are listed, but only abnormal results are displayed) Labs Reviewed - No data to display  EKG  EKG Interpretation None       Radiology No results found.  Procedures Procedures (including critical care time)  Medications Ordered in ED Medications  ketorolac (TORADOL) injection 60 mg (60 mg Intramuscular Given 01/27/16 1205)     Initial Impression / Assessment and Plan / ED Course  I have reviewed the triage vital signs and  the nursing notes.  Pertinent labs & imaging results that were available during my care of the patient were reviewed by me and considered in my medical decision making (see chart for details).  Clinical Course   Patient is 40 yo M with history of chronic headaches, presenting with sudden onset of neck/occipital pain. Patient followed by neurologist Dr. Stephanie Acre, who recently evaluated patient on 12/12/2015. Completely normal neuro exam, not concerning for ICH, stroke, or acute intracranial process. CT imaging deferred per discussion with attending physician, Dr. Margarita Grizzle. Given IM Toradol for pain, which completely resolved headache. Ambulated without difficulty. Stable for d/c home with prescription strength ibuprofen for pain, and instructions to f/u with Dr. Anne Hahn. Also given resources to establish care with PCP. Advised to return to ED for new or worsening symptoms including numbness, weakness, dizziness, visual changes, or slurred speech.  Final Clinical Impressions(s) / ED Diagnoses    Final diagnoses:  Chronic nonintractable headache, unspecified headache type    New Prescriptions Discharge Medication List as of 01/27/2016  1:19 PM       Jari Pigg II, PA 01/27/16 2006    Margarita Grizzle, MD 01/29/16 7829

## 2016-01-27 NOTE — ED Triage Notes (Signed)
Per Pt, Pt has Hx of sudden on onset of deafness to the right ear and right sided headaches that started two years ago. P has been seeing a neurologist for the condition. Yesterday, pt reports sudden onset of neck pain with increased headache and ringing in the right ear that has become worse. Pt denies any blurred vision, tingling, unsteady gait, or nausea. Pt was instructed to come here by Neuro RN.

## 2016-02-27 ENCOUNTER — Other Ambulatory Visit: Payer: Self-pay | Admitting: Adult Health

## 2016-02-29 NOTE — Telephone Encounter (Signed)
RX for tramadol faxed to [facility]. Received a receipt of confirmation.

## 2016-03-07 ENCOUNTER — Other Ambulatory Visit: Payer: Self-pay | Admitting: Adult Health

## 2016-03-07 NOTE — Telephone Encounter (Signed)
Megan last filled 02/29/2016 for 30 tabs and no refill.   Has appt in 06/2016.  Your last fill was 90 tabs.

## 2016-03-07 NOTE — Telephone Encounter (Signed)
Rx printed, signed, faxed to pharmacy. 

## 2016-04-14 IMAGING — MR MR HEAD WO/W CM
6 of 11 series · 21 of 48 positions shown · IV contrast (multihance)
Comparison: None.

CLINICAL DATA: Sudden onset of RIGHT-sided hearing loss beginning
several days ago. Recent visit to the emergency department for
headache.

EXAM:
MRI HEAD WITHOUT AND WITH CONTRAST
TECHNIQUE: Multiplanar, multiecho pulse sequences of the brain and surrounding
structures were obtained without and with intravenous contrast.
CONTRAST:  19mL MULTIHANCE GADOBENATE DIMEGLUMINE 529 MG/ML IV SOLN

[Series 2: T1 · sagittal · 5.0mm · 0.36mm/px · 1 of 21 slices shown]
[im 1/21]
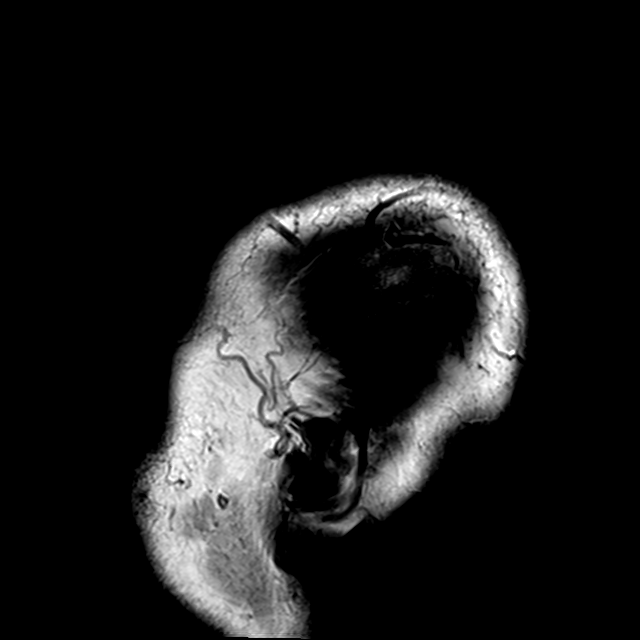

[Series 3: DWI · axial · 5.0mm · 0.90mm/px · z∈[-59,+87]mm · 8 of 52 slices shown]
[im 1/52]
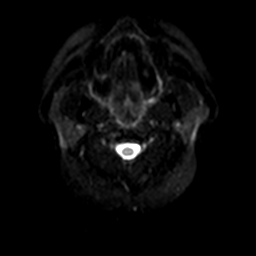
[im 8/52]
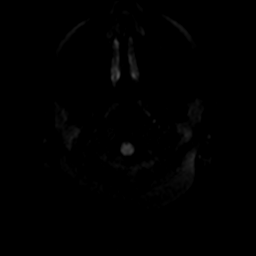
[im 15/52]
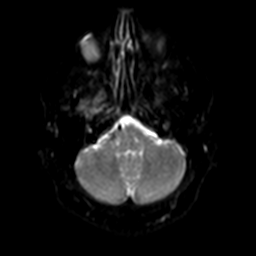
[im 22/52]
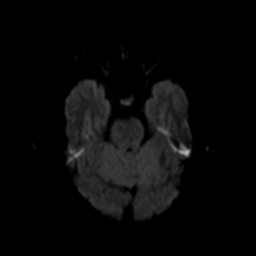
[im 30/52]
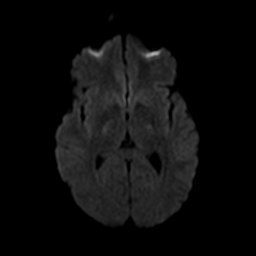
[im 37/52]
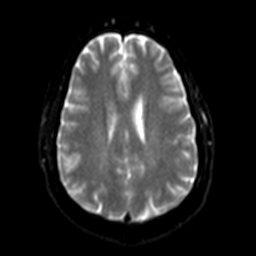
[im 44/52]
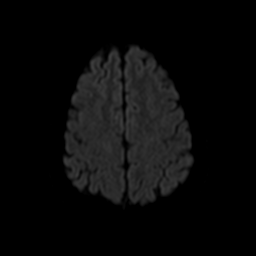
[im 52/52]
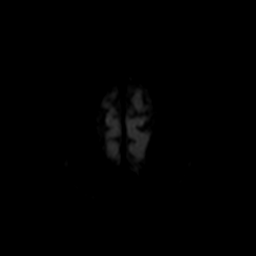

[Series 5: T2 · axial · 5.0mm · 0.30mm/px · z∈[-60,+86]mm · 4 of 26 slices shown]
[im 1/26]
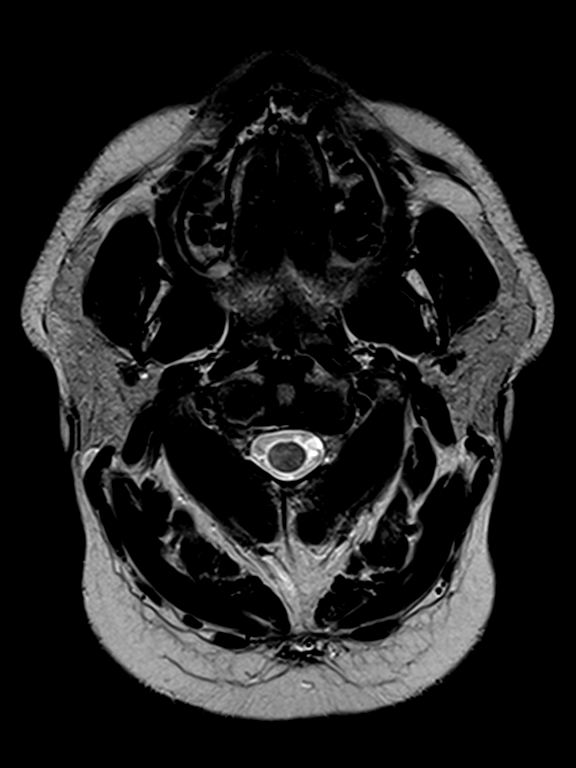
[im 9/26]
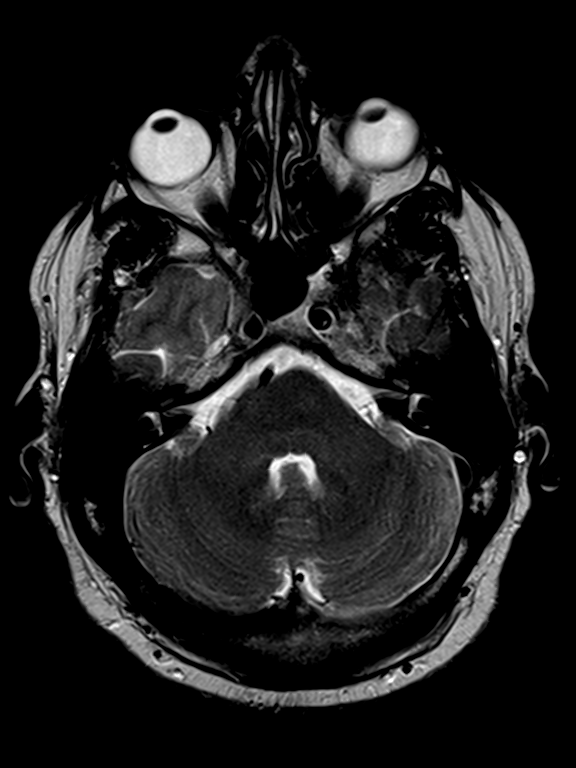
[im 17/26]
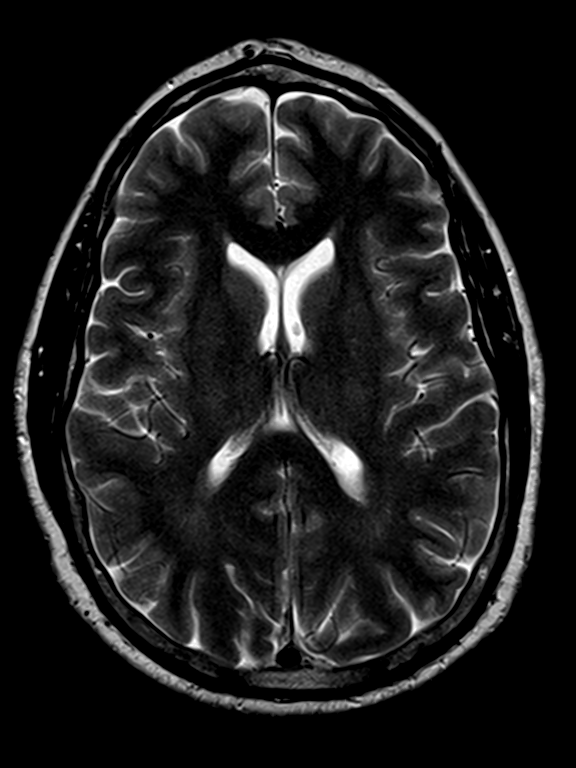
[im 26/26]
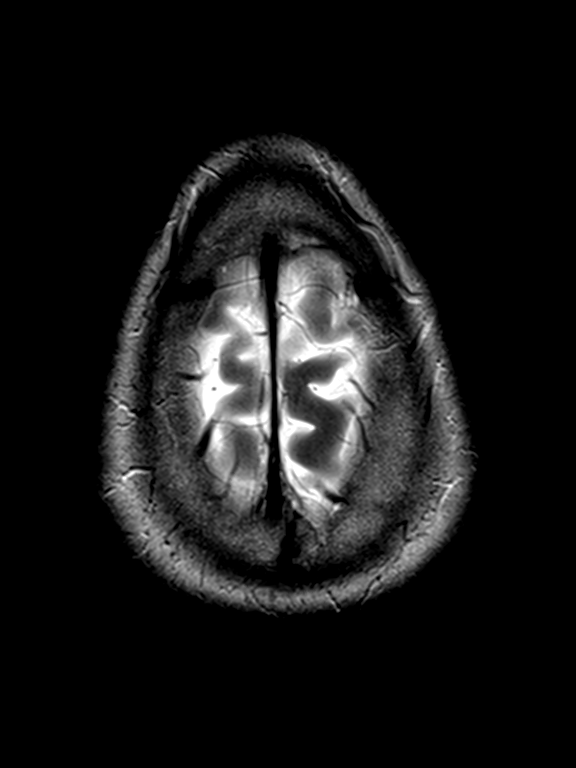

[Series 6: FLAIR · axial · 5.0mm · 0.36mm/px · z∈[-60,+86]mm · 4 of 26 slices shown]
[im 1/26]
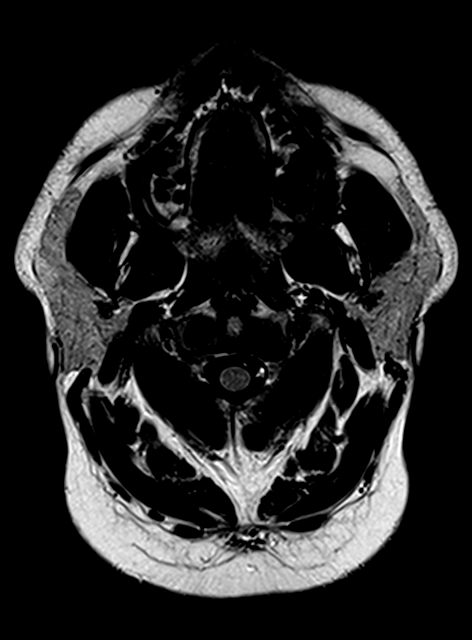
[im 9/26]
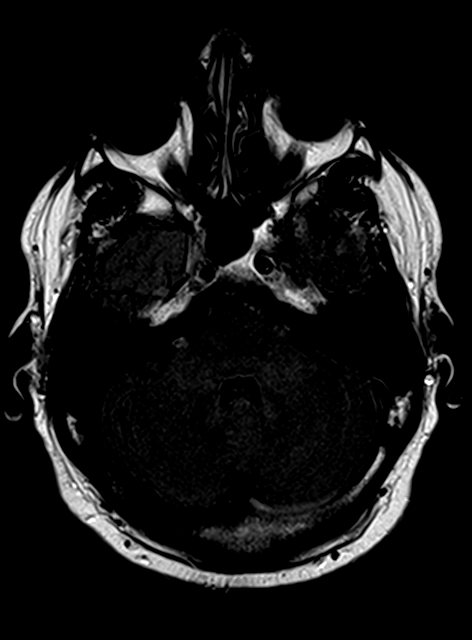
[im 17/26]
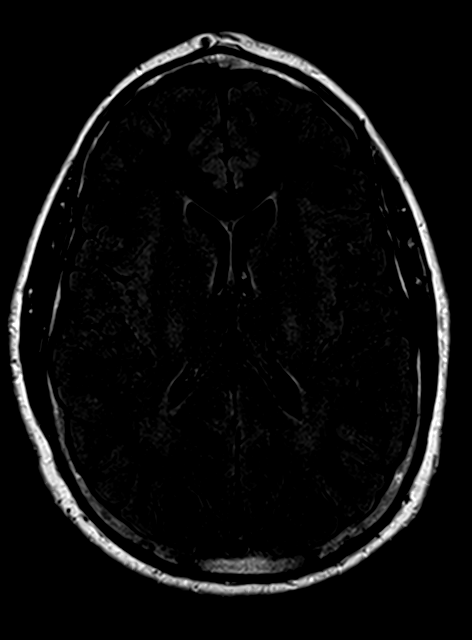
[im 26/26]
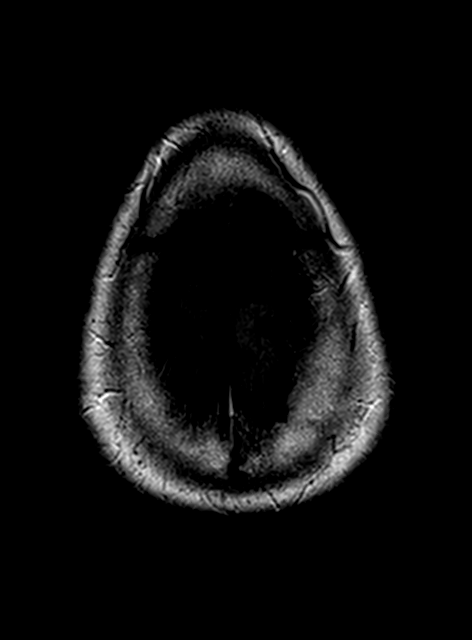

[Series 10: T1 post-contrast · coronal · 3.0mm · 0.35mm/px · 2 of 11 slices shown (1 of 2)]
[im 1/11]
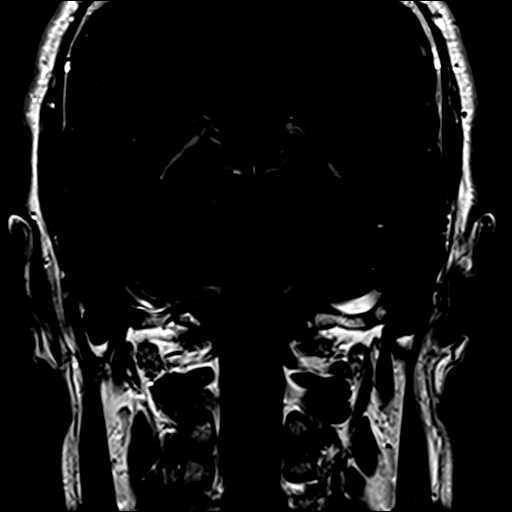
[im 11/11]
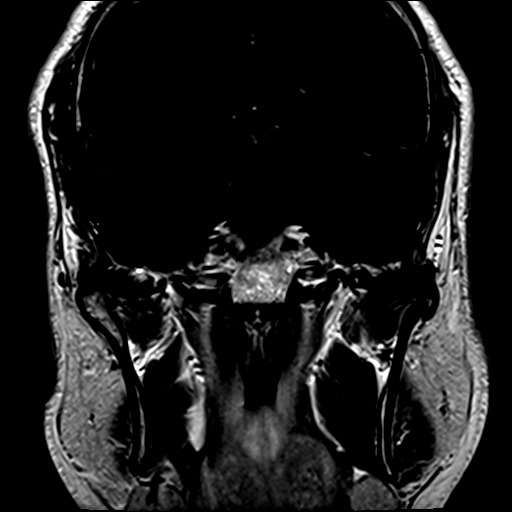

[Series 11: T1 post-contrast · axial · 3.0mm · 0.35mm/px · z∈[-37,-4]mm · 2 of 11 slices shown (2 of 2)]
[im 1/11]
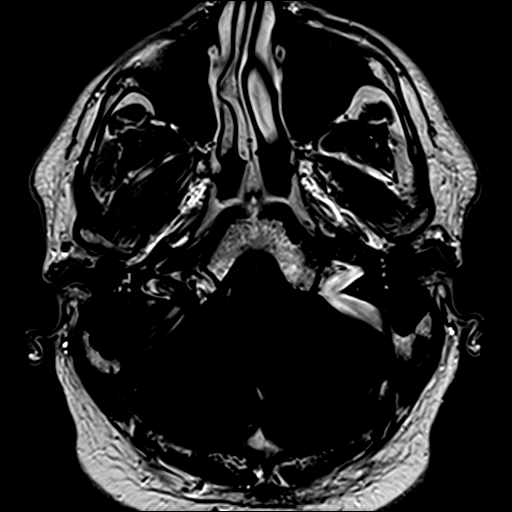
[im 11/11]
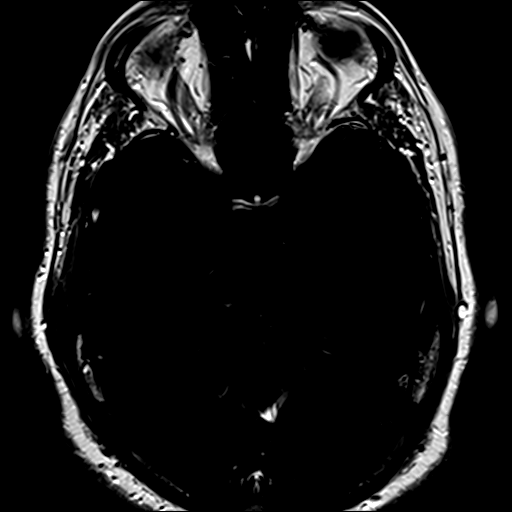

[21 of 48 positions shown; findings below may reference images not displayed]

FINDINGS: No evidence for acute infarction, hemorrhage, mass lesion,
hydrocephalus, or extra-axial fluid. Normal for age cerebral volume.
No appreciable white matter disease. Flow voids are maintained
throughout the carotid, basilar, and vertebral arteries. There are
no areas of chronic hemorrhage. Pituitary, pineal, and cerebellar
tonsils unremarkable. No upper cervical lesions.

Visualized calvarium, skull base, and upper cervical osseous
structures unremarkable. Scalp and extracranial soft tissues,
orbits, sinuses, and mastoids show no acute process.

Thin-section imaging through the posterior fossa reveals no evidence
for vestibular schwannoma or posterior fossa mass. No temporal bone
inflammatory process. No enhancement of the cochlea or vestibule is
evident. Major dural venous sinuses are patent.

Slight cloudlike enhancement of the dorsal thalamus on the RIGHT not
associated with significant prolongation of T2 or FLAIR signal, of
restricted diffusion, or other significant signal abnormality. This
is subcentimeter in size, roughly 6 mm. No evidence for
developmental venous anomaly or other associated abnormality. I
suspect this represents an incidental capillary telangectasia. If
there is a history of malignancy, a metastatic lesion cannot
completely be excluded. Recommend follow-up brain MRI without and
with contrast in 6 months to ensure interval stability. The
relationship to this lesion to the patient's headache is also
unclear.
IMPRESSION: No cause is seen for RIGHT-sided hearing loss. No acute intracranial
findings are evident.

6 mm area of non worrisome enhancement RIGHT thalamus, favor
capillary telangiectasia. See discussion above

## 2016-04-26 ENCOUNTER — Encounter (HOSPITAL_COMMUNITY): Payer: Self-pay | Admitting: Emergency Medicine

## 2016-04-26 ENCOUNTER — Observation Stay (HOSPITAL_COMMUNITY)
Admission: EM | Admit: 2016-04-26 | Discharge: 2016-04-28 | Disposition: A | Discharge status: Home / Self Care | Payer: Managed Care, Other (non HMO) | Attending: Cardiology | Admitting: Cardiology

## 2016-04-26 DIAGNOSIS — I2510 Atherosclerotic heart disease of native coronary artery without angina pectoris: Secondary | ICD-10-CM | POA: Diagnosis present

## 2016-04-26 DIAGNOSIS — G4733 Obstructive sleep apnea (adult) (pediatric): Secondary | ICD-10-CM | POA: Diagnosis not present

## 2016-04-26 DIAGNOSIS — I259 Chronic ischemic heart disease, unspecified: Secondary | ICD-10-CM

## 2016-04-26 DIAGNOSIS — R0602 Shortness of breath: Secondary | ICD-10-CM | POA: Diagnosis present

## 2016-04-26 DIAGNOSIS — E782 Mixed hyperlipidemia: Secondary | ICD-10-CM | POA: Diagnosis not present

## 2016-04-26 DIAGNOSIS — Z7982 Long term (current) use of aspirin: Secondary | ICD-10-CM | POA: Insufficient documentation

## 2016-04-26 DIAGNOSIS — I25110 Atherosclerotic heart disease of native coronary artery with unstable angina pectoris: Secondary | ICD-10-CM | POA: Diagnosis not present

## 2016-04-26 DIAGNOSIS — Z23 Encounter for immunization: Secondary | ICD-10-CM | POA: Insufficient documentation

## 2016-04-26 DIAGNOSIS — I200 Unstable angina: Secondary | ICD-10-CM | POA: Diagnosis present

## 2016-04-26 LAB — URINALYSIS, ROUTINE W REFLEX MICROSCOPIC
BILIRUBIN URINE: NEGATIVE
Glucose, UA: NEGATIVE mg/dL
Hgb urine dipstick: NEGATIVE
KETONES UR: NEGATIVE mg/dL
Leukocytes, UA: NEGATIVE
Nitrite: NEGATIVE
PH: 6 (ref 5.0–8.0)
Protein, ur: NEGATIVE mg/dL
Specific Gravity, Urine: 1.011 (ref 1.005–1.030)

## 2016-04-26 LAB — CBC
HEMATOCRIT: 43.6 % (ref 39.0–52.0)
Hemoglobin: 14.8 g/dL (ref 13.0–17.0)
MCH: 29.8 pg (ref 26.0–34.0)
MCHC: 33.9 g/dL (ref 30.0–36.0)
MCV: 87.9 fL (ref 78.0–100.0)
Platelets: 195 10*3/uL (ref 150–400)
RBC: 4.96 MIL/uL (ref 4.22–5.81)
RDW: 12.8 % (ref 11.5–15.5)
WBC: 10.6 10*3/uL — ABNORMAL HIGH (ref 4.0–10.5)

## 2016-04-26 LAB — BASIC METABOLIC PANEL
Anion gap: 12 (ref 5–15)
BUN: 17 mg/dL (ref 6–20)
CHLORIDE: 102 mmol/L (ref 101–111)
CO2: 24 mmol/L (ref 22–32)
Calcium: 9.6 mg/dL (ref 8.9–10.3)
Creatinine, Ser: 1.03 mg/dL (ref 0.61–1.24)
GFR calc Af Amer: >60 mL/min (ref >60)
GFR calc non Af Amer: >60 mL/min (ref >60)
GLUCOSE: 93 mg/dL (ref 65–99)
POTASSIUM: 3.9 mmol/L (ref 3.5–5.1)
Sodium: 138 mmol/L (ref 135–145)

## 2016-04-26 NOTE — ED Triage Notes (Signed)
Pt. Stated, I went to [address] [facility] [address] On Monday I was given Prednisone,  And Inhaler, and told to continue the breathing treatment at home. I was feeling better and this evening I felt dizzy ad not feeling good. He did a xray, EKG and was treated for asthma.  I just don't feel good today.

## 2016-04-26 NOTE — ED Notes (Signed)
Oni MD at bedside  

## 2016-04-26 NOTE — ED Provider Notes (Signed)
[facility]-EMERGENCY DEPT Provider Note   CSN: [id number] Arrival date & time: 04/26/16  1656  By signing my name below, I, Freida BusmanDiana Omoyeni and Elder Negusussell Johnston, attest that this documentation has been prepared under the direction and in the presence of Tomasita CrumbleAdeleke Dashauna Heymann, MD . Electronically Signed: Freida Busmaniana Omoyeni, Scribe. 04/26/2016. 11:52 PM.  History   Chief Complaint Chief Complaint  Patient presents with  . Asthma  . Dizziness    HPI Zachary RunnerRicky Grant is a 41 y.o. male with history of asthma presents to the ED with lightheadedness beginning this afternoon. The patient states that since this afternoon, whether sitting or ambulating, he develops intermittent lightheadedness with dyspnea, confusion, and "chest tightness" that spontaneously resolves after a few minutes, worse with exertion.  He also visited an urgent care facility earlier this week following an "asthma attack", received a negative plain film of the chest, and was subsequently placed on prednisone, albuterol inhalers and medication for his nebulizer. The patient does state that these treatments are improving his shortness of breath, however his lightheadedness is not typical of his asthma. His chest tightness is not typical either and he has worsening of this tightness with exertion.  It is relieved with rest.  He has no other concerns at this time.  The history is provided by the patient. No language interpreter was used.    Past Medical History:  Diagnosis Date  . Bilateral headaches    Being worked up by neurology - 01/2014  . Headache disorder 02/10/2014  . Hearing loss of right ear    Being worked up by ENT 01/2014  . Mild asthma   . OSA on CPAP    per study  05-04-2013 mild osa  . Traumatic rupture of left Achilles tendon   . Wears contact lenses     Patient Active Problem List   Diagnosis Date Noted  . Hearing loss of right ear 07/14/2014  . Headache disorder 02/10/2014  . OSA on CPAP 02/06/2014  . Asthma, chronic  02/06/2014  . Achilles rupture, left 02/06/2014    Past Surgical History:  Procedure Laterality Date  . ACHILLES TENDON REPAIR Left 2014   and removal spur  . ACHILLES TENDON SURGERY Left 09/04/2013   Procedure: LEFT ACHILLES TENDON REPAIR ;  Surgeon: Ludger NuttingN'Tuma Jah, MD;  Location: [facility];  Service: Podiatry;  Laterality: Left;  . CARDIOVASCULAR STRESS TEST  05-25-2011   DR Jacinto HalimGANJI   NORMAL PERFUSION/  NORMAL EF  . TONSILLECTOMY  age 41       Home Medications    Prior to Admission medications   Medication Sig Start Date End Date Taking? Authorizing Provider  traMADol (ULTRAM) 50 MG tablet TAKE ONE TABLET BY MOUTH EVERY 6 HOURS AS NEEDED 03/07/16  Yes York Spanielharles K Willis, MD  ibuprofen (ADVIL,MOTRIN) 800 MG tablet Take 1 tablet (800 mg total) by mouth every 6 (six) hours as needed for moderate pain. Patient not taking: Reported on 04/27/2016 01/27/16   Daryl F de Villier II, PA  indomethacin (INDOCIN SR) 75 MG CR capsule Take 1 capsule (75 mg total) by mouth 2 (two) times daily with a meal. Patient not taking: Reported on 04/27/2016 12/12/15   Butch PennyMegan Millikan, NP  lamoTRIgine (LAMICTAL) 25 MG tablet Take 1 tablet (25 mg total) by mouth 2 (two) times daily. Patient not taking: Reported on 04/27/2016 12/12/15   Butch PennyMegan Millikan, NP  naproxen (NAPROSYN) 500 MG tablet Take 1 tablet (500 mg total) by mouth 2 (two) times daily. Patient not  taking: Reported on 04/27/2016 07/10/15   Anselm Pancoast, PA-C    Family History Family History  Problem Relation Age of Onset  . Asthma Mother   . Depression Brother     Social History Social History  Substance Use Topics  . Smoking status: Former Smoker    Packs/day: 1.00    Years: 7.00    Types: Cigarettes    Quit date: 06/03/2008  . Smokeless tobacco: Never Used  . Alcohol use Yes     Comment: Occasionally      Allergies   Gabapentin   Review of Systems Review of Systems  10 systems reviewed and all are negative for acute  change except as noted in the HPI.   Physical Exam Updated Vital Signs BP 122/77   Pulse 68   Temp 97.9 F (36.6 C)   Resp 17   Ht 5\' 8"  (1.727 m)   Wt 195 lb (88.5 kg)   SpO2 97%   BMI 29.65 kg/m   Physical Exam  Constitutional: He is oriented to person, place, and time. Vital signs are normal. He appears well-developed and well-nourished.  Non-toxic appearance. He does not appear ill. No distress.  HENT:  Head: Normocephalic and atraumatic.  Nose: Nose normal.  Mouth/Throat: Oropharynx is clear and moist. No oropharyngeal exudate.  Eyes: Conjunctivae and EOM are normal. Pupils are equal, round, and reactive to light. No scleral icterus.  Neck: Normal range of motion. Neck supple. No tracheal deviation, no edema, no erythema and normal range of motion present. No thyroid mass and no thyromegaly present.  Cardiovascular: Normal rate, regular rhythm, S1 normal, S2 normal, normal heart sounds, intact distal pulses and normal pulses.  Exam reveals no gallop and no friction rub.   No murmur heard. Pulmonary/Chest: Effort normal and breath sounds normal. No respiratory distress. He has no wheezes. He has no rhonchi. He has no rales.  Abdominal: Soft. Normal appearance and bowel sounds are normal. He exhibits no distension, no ascites and no mass. There is no hepatosplenomegaly. There is no tenderness. There is no rebound, no guarding and no CVA tenderness.  Musculoskeletal: Normal range of motion. He exhibits no edema or tenderness.  Lymphadenopathy:    He has no cervical adenopathy.  Neurological: He is alert and oriented to person, place, and time. He has normal strength. No cranial nerve deficit or sensory deficit.  Skin: Skin is warm, dry and intact. No petechiae and no rash noted. He is not diaphoretic. No erythema. No pallor.  Nursing note and vitals reviewed.    ED Treatments / Results  DIAGNOSTIC STUDIES:  Oxygen Saturation is 97% on RA, normal by my interpretation.     COORDINATION OF CARE:  11:40 PM Discussed treatment plan with pt at bedside and pt agreed to plan.   Labs (all labs ordered are listed, but only abnormal results are displayed) Labs Reviewed  CBC - Abnormal; Notable for the following:       Result Value   WBC 10.6 (*)    All other components within normal limits  URINALYSIS, ROUTINE W REFLEX MICROSCOPIC - Abnormal; Notable for the following:    Color, Urine STRAW (*)    All other components within normal limits  BASIC METABOLIC PANEL  D-DIMER, QUANTITATIVE (NOT AT [facility])  CBG MONITORING, ED  Rosezena Sensor, ED    EKG  EKG Interpretation  Date/Time:  Thursday April 26 2016 18:01:33 EST Ventricular Rate:  72 PR Interval:  108 QRS Duration: 80 QT Interval:  376 QTC Calculation: 411 R Axis:   52 Text Interpretation:  Sinus rhythm with short PR Otherwise normal ECG no significant change since tracing before last Confirmed by Erroll Luna [phone]) on 04/26/2016 11:26:48 PM       Radiology Dg Chest 2 View  Result Date: 04/27/2016 CLINICAL DATA:  Acute onset of generalized chest tightness, shortness of breath and dizziness. Initial encounter. EXAM: CHEST  2 VIEW COMPARISON:  Chest radiograph performed 06/27/2015 FINDINGS: The lungs are well-aerated. Vascular congestion is noted. Mildly increased interstitial markings may reflect minimal interstitial edema. There is no evidence of pleural effusion or pneumothorax. The heart is normal in size; the mediastinal contour is within normal limits. No acute osseous abnormalities are seen. IMPRESSION: Vascular congestion. Mildly increased interstitial markings may reflect minimal interstitial edema. Electronically Signed   By: Roanna Raider M.D.   On: 04/27/2016 00:13    Procedures Procedures (including critical care time)  Medications Ordered in ED Medications  nitroGLYCERIN (NITROSTAT) SL tablet 0.4 mg (0.4 mg Sublingual Given 04/27/16 0141)  aspirin tablet 325 mg (not  administered)     Initial Impression / Assessment and Plan / ED Course  I have reviewed the triage vital signs and the nursing notes.  Pertinent labs & imaging results that were available during my care of the patient were reviewed by me and considered in my medical decision making (see chart for details).  Clinical Course     Patient presents to the ED for chest tightness and SOB.  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  He is low risk for ACS and PE.  Will evaluate with 2 sets of troponins and d dimer.    2:00 AM Pain improved with nitro   2:07 AM Discussed case with Dr. Jacinto Halim (Cardiology) will admit.   Dimer is negative.  Patient will be admitted for further care.  Aspirin givein as well.   Final Clinical Impressions(s) / ED Diagnoses   Final diagnoses:  None    New Prescriptions New Prescriptions   No medications on file   I personally performed the services described in this documentation, which was scribed in my presence. The recorded information has been reviewed and is accurate.       Tomasita Crumble, MD 04/27/16 [phone]

## 2016-04-27 ENCOUNTER — Encounter (HOSPITAL_COMMUNITY): Payer: Self-pay | Admitting: *Deleted

## 2016-04-27 ENCOUNTER — Encounter (HOSPITAL_COMMUNITY)
Admission: EM | Disposition: A | Discharge status: Home / Self Care | Payer: Self-pay | Source: Home / Self Care | Attending: Emergency Medicine

## 2016-04-27 ENCOUNTER — Emergency Department (HOSPITAL_COMMUNITY): Payer: Managed Care, Other (non HMO)

## 2016-04-27 DIAGNOSIS — I200 Unstable angina: Secondary | ICD-10-CM | POA: Diagnosis present

## 2016-04-27 DIAGNOSIS — I2510 Atherosclerotic heart disease of native coronary artery without angina pectoris: Secondary | ICD-10-CM | POA: Diagnosis present

## 2016-04-27 HISTORY — PX: CARDIAC CATHETERIZATION: SHX172

## 2016-04-27 LAB — CBC
HCT: 43 % (ref 39.0–52.0)
HEMOGLOBIN: 14.4 g/dL (ref 13.0–17.0)
MCH: 29.6 pg (ref 26.0–34.0)
MCHC: 33.5 g/dL (ref 30.0–36.0)
MCV: 88.3 fL (ref 78.0–100.0)
PLATELETS: 174 10*3/uL (ref 150–400)
RBC: 4.87 MIL/uL (ref 4.22–5.81)
RDW: 12.9 % (ref 11.5–15.5)
WBC: 8.6 10*3/uL (ref 4.0–10.5)

## 2016-04-27 LAB — LIPID PANEL
Cholesterol: 235 mg/dL — ABNORMAL HIGH (ref 0–200)
HDL: 39 mg/dL — ABNORMAL LOW (ref >40)
LDL CALC: 169 mg/dL — AB (ref 0–99)
TRIGLYCERIDES: 137 mg/dL (ref <150)
Total CHOL/HDL Ratio: 6 RATIO
VLDL: 27 mg/dL (ref 0–40)

## 2016-04-27 LAB — CREATININE, SERUM
CREATININE: 1.12 mg/dL (ref 0.61–1.24)
GFR calc Af Amer: >60 mL/min (ref >60)

## 2016-04-27 LAB — TROPONIN I
TROPONIN I: 0.03 ng/mL — AB (ref <0.03)
Troponin I: <0.03 ng/mL (ref <0.03)

## 2016-04-27 LAB — HEPARIN LEVEL (UNFRACTIONATED): HEPARIN UNFRACTIONATED: 0.53 [IU]/mL (ref 0.30–0.70)

## 2016-04-27 LAB — PROTIME-INR
INR: 0.88
PROTHROMBIN TIME: 11.9 s (ref 11.4–15.2)

## 2016-04-27 LAB — I-STAT TROPONIN, ED: Troponin i, poc: 0 ng/mL (ref 0.00–0.08)

## 2016-04-27 LAB — TSH: TSH: 1.49 u[IU]/mL (ref 0.350–4.500)

## 2016-04-27 LAB — D-DIMER, QUANTITATIVE: D-Dimer, Quant: 0.36 ug/mL-FEU (ref 0.00–0.50)

## 2016-04-27 LAB — POCT ACTIVATED CLOTTING TIME: Activated Clotting Time: 224 seconds

## 2016-04-27 SURGERY — LEFT HEART CATH AND CORONARY ANGIOGRAPHY
Anesthesia: LOCAL

## 2016-04-27 MED ORDER — MIDAZOLAM HCL 2 MG/2ML IJ SOLN
INTRAMUSCULAR | Status: AC
  Filled 2016-04-27: qty 2

## 2016-04-27 MED ORDER — NITROGLYCERIN 0.4 MG SL SUBL: 0.4000 mg | SUBLINGUAL_TABLET | SUBLINGUAL | 2 refills | Status: DC | PRN

## 2016-04-27 MED ORDER — AMLODIPINE BESYLATE 5 MG PO TABS: 5.0000 mg | ORAL_TABLET | Freq: Every day | ORAL | 1 refills | Status: DC

## 2016-04-27 MED ORDER — IOPAMIDOL (ISOVUE-370) INJECTION 76%
INTRAVENOUS | Status: DC | PRN
  Administered 2016-04-27: 100 mL via INTRA_ARTERIAL

## 2016-04-27 MED ORDER — SODIUM CHLORIDE 0.9% FLUSH: 3.0000 mL | Freq: Two times a day (BID) | INTRAVENOUS | Status: DC

## 2016-04-27 MED ORDER — SODIUM CHLORIDE 0.9 % IV SOLN
INTRAVENOUS | Status: DC
  Administered 2016-04-27: 15:00:00 via INTRAVENOUS
  Administered 2016-04-27: 500 mL via INTRAVENOUS
  Administered 2016-04-27: 08:00:00 via INTRAVENOUS

## 2016-04-27 MED ORDER — ASPIRIN EC 81 MG PO TBEC: 81.0000 mg | DELAYED_RELEASE_TABLET | Freq: Every day | ORAL | Status: DC

## 2016-04-27 MED ORDER — NITROGLYCERIN 1 MG/10 ML FOR IR/CATH LAB
INTRA_ARTERIAL | Status: AC
  Filled 2016-04-27: qty 10

## 2016-04-27 MED ORDER — HEPARIN SODIUM (PORCINE) 1000 UNIT/ML IJ SOLN
INTRAMUSCULAR | Status: AC
  Filled 2016-04-27: qty 1

## 2016-04-27 MED ORDER — SODIUM CHLORIDE 0.9 % WEIGHT BASED INFUSION
1.0000 mL/kg/h | INTRAVENOUS | Status: DC
Start: 2016-04-28 — End: 2016-04-27

## 2016-04-27 MED ORDER — SODIUM CHLORIDE 0.9% FLUSH: 3.0000 mL | INTRAVENOUS | Status: DC | PRN

## 2016-04-27 MED ORDER — HEPARIN SODIUM (PORCINE) 1000 UNIT/ML IJ SOLN
INTRAMUSCULAR | Status: DC | PRN
  Administered 2016-04-27: 2000 [IU] via INTRAVENOUS
  Administered 2016-04-27: 6000 [IU] via INTRAVENOUS

## 2016-04-27 MED ORDER — ONDANSETRON HCL 4 MG/2ML IJ SOLN: 4.0000 mg | Freq: Four times a day (QID) | INTRAMUSCULAR | Status: DC | PRN

## 2016-04-27 MED ORDER — LIDOCAINE HCL (PF) 1 % IJ SOLN
INTRAMUSCULAR | Status: AC
  Filled 2016-04-27: qty 30

## 2016-04-27 MED ORDER — HEPARIN BOLUS VIA INFUSION
2000.0000 [IU] | Freq: Once | INTRAVENOUS | Status: AC
  Administered 2016-04-27: 2000 [IU] via INTRAVENOUS
  Filled 2016-04-27: qty 2000

## 2016-04-27 MED ORDER — HEPARIN (PORCINE) IN NACL 100-0.45 UNIT/ML-% IJ SOLN
1200.0000 [IU]/h | INTRAMUSCULAR | Status: DC
  Administered 2016-04-27: 1200 [IU]/h via INTRAVENOUS
  Filled 2016-04-27: qty 250

## 2016-04-27 MED ORDER — IOPAMIDOL (ISOVUE-370) INJECTION 76%
INTRAVENOUS | Status: AC
  Filled 2016-04-27: qty 100

## 2016-04-27 MED ORDER — ACETAMINOPHEN 325 MG PO TABS: 650.0000 mg | ORAL_TABLET | ORAL | Status: DC | PRN

## 2016-04-27 MED ORDER — HEPARIN SODIUM (PORCINE) 5000 UNIT/ML IJ SOLN
5000.0000 [IU] | Freq: Three times a day (TID) | INTRAMUSCULAR | Status: DC
  Administered 2016-04-27: 5000 [IU] via SUBCUTANEOUS
  Filled 2016-04-27: qty 1

## 2016-04-27 MED ORDER — INFLUENZA VAC SPLIT QUAD 0.5 ML IM SUSY
0.5000 mL | PREFILLED_SYRINGE | INTRAMUSCULAR | Status: AC
  Administered 2016-04-28: 0.5 mL via INTRAMUSCULAR
  Filled 2016-04-27: qty 0.5

## 2016-04-27 MED ORDER — VERAPAMIL HCL 2.5 MG/ML IV SOLN
INTRA_ARTERIAL | Status: DC | PRN
  Administered 2016-04-27: 7 mL via INTRA_ARTERIAL

## 2016-04-27 MED ORDER — ATORVASTATIN CALCIUM 80 MG PO TABS: 80.0000 mg | ORAL_TABLET | Freq: Every day | ORAL | 2 refills | Status: DC

## 2016-04-27 MED ORDER — SODIUM CHLORIDE 0.9 % IV SOLN: 250.0000 mL | INTRAVENOUS | Status: DC | PRN

## 2016-04-27 MED ORDER — SODIUM CHLORIDE 0.9 % WEIGHT BASED INFUSION
1.0000 mL/kg/h | INTRAVENOUS | Status: AC
  Administered 2016-04-27: 1 mL/kg/h via INTRAVENOUS

## 2016-04-27 MED ORDER — HYDROMORPHONE HCL 1 MG/ML IJ SOLN
INTRAMUSCULAR | Status: AC
  Filled 2016-04-27: qty 1

## 2016-04-27 MED ORDER — HEPARIN (PORCINE) IN NACL 2-0.9 UNIT/ML-% IJ SOLN
INTRAMUSCULAR | Status: AC
  Filled 2016-04-27: qty 1000

## 2016-04-27 MED ORDER — HYDROMORPHONE HCL 1 MG/ML IJ SOLN
INTRAMUSCULAR | Status: DC | PRN
  Administered 2016-04-27: 0.5 mg via INTRAVENOUS

## 2016-04-27 MED ORDER — ASPIRIN 325 MG PO TABS
325.0000 mg | ORAL_TABLET | Freq: Once | ORAL | Status: AC
  Administered 2016-04-27: 325 mg via ORAL
  Filled 2016-04-27: qty 1

## 2016-04-27 MED ORDER — METOPROLOL SUCCINATE ER 25 MG PO TB24
25.0000 mg | ORAL_TABLET | Freq: Every day | ORAL | Status: DC
  Filled 2016-04-27: qty 1

## 2016-04-27 MED ORDER — MIDAZOLAM HCL 2 MG/2ML IJ SOLN
INTRAMUSCULAR | Status: DC | PRN
  Administered 2016-04-27: 2 mg via INTRAVENOUS

## 2016-04-27 MED ORDER — LIDOCAINE HCL (PF) 1 % IJ SOLN
INTRAMUSCULAR | Status: DC | PRN
  Administered 2016-04-27: 2 mL

## 2016-04-27 MED ORDER — ASPIRIN 81 MG PO TBEC: 81.0000 mg | DELAYED_RELEASE_TABLET | Freq: Every day | ORAL | Status: DC

## 2016-04-27 MED ORDER — HEPARIN (PORCINE) IN NACL 2-0.9 UNIT/ML-% IJ SOLN
INTRAMUSCULAR | Status: DC | PRN
  Administered 2016-04-27: 1000 mL

## 2016-04-27 MED ORDER — NITROGLYCERIN 0.4 MG SL SUBL
0.4000 mg | SUBLINGUAL_TABLET | SUBLINGUAL | Status: DC | PRN
  Administered 2016-04-27: 0.4 mg via SUBLINGUAL
  Filled 2016-04-27: qty 1

## 2016-04-27 MED ORDER — SODIUM CHLORIDE 0.9 % WEIGHT BASED INFUSION: 3.0000 mL/kg/h | INTRAVENOUS | Status: DC

## 2016-04-27 MED ORDER — ATORVASTATIN CALCIUM 80 MG PO TABS
80.0000 mg | ORAL_TABLET | Freq: Every day | ORAL | Status: DC
  Administered 2016-04-27: 80 mg via ORAL
  Filled 2016-04-27: qty 1

## 2016-04-27 SURGICAL SUPPLY — 11 items
CATH INFINITI 5 FR JL3.5 (CATHETERS) ×1 IMPLANT
CATH OPTITORQUE TIG 4.0 5F (CATHETERS) ×1 IMPLANT
DEVICE RAD COMP TR BAND LRG (VASCULAR PRODUCTS) ×1 IMPLANT
GLIDESHEATH SLEND A-KIT 6F 20G (SHEATH) ×1 IMPLANT
GUIDEWIRE ANGLED .035X150CM (WIRE) ×1 IMPLANT
GUIDEWIRE INQWIRE 1.5J.035X260 (WIRE) IMPLANT
INQWIRE 1.5J .035X260CM (WIRE) ×2
KIT HEART LEFT (KITS) ×2 IMPLANT
PACK CARDIAC CATHETERIZATION (CUSTOM PROCEDURE TRAY) ×2 IMPLANT
TRANSDUCER W/STOPCOCK (MISCELLANEOUS) ×2 IMPLANT
TUBING CIL FLEX 10 FLL-RA (TUBING) ×2 IMPLANT

## 2016-04-27 NOTE — Progress Notes (Signed)
ANTICOAGULATION CONSULT NOTE - FOLLOW UP    HL = 0.53 (goal 0.3 - 0.7 units/mL) Heparin dosing weight = 86 kg   Assessment: 40 YOM with ACS started on IV heparin this AM.  Heparin level is therapeutic before patient went to cath; no bleeding reported.   Plan: - F/U post cath    Wake Conlee D. Laney Potashang, PharmD, BCPS 04/27/2016, 3:48 PM

## 2016-04-27 NOTE — Discharge Summary (Addendum)
Physician Discharge Summary  Patient ID: Zachary Grant MRN: [id number] DOB/AGE: [birth year] 44 y.o.  Admit date: 04/26/2016 Discharge date: 04/28/2016  Primary Discharge Diagnosis Unstable angina probably related to coronary spasm Mild coronary atherosclerosis, calcification of the proximal and mid LAD. Mixed hyperlipidemia with elevated LDL and reduced HDL  Significant Diagnostic Studies: Coronary angiogram 04/27/2016: Normal LV systolic function, year 55-60%. Low LVEDP. Mild coronary calcification involving the proximal and mid LAD. Otherwise normal coronary arteries, left dominant circulation. Right is very small and normal.  Impression: Suspect his ACS was probably related to coronary spasm. He can be discharged home today with resuming statins and also I will add amlodipine 5 mg daily. Continued risk factor modification is indicated. Needs statins indefinitely.  Hospital Course: Patient admitted via emergency room with chest discomfort ongoing since Monday on and off, exertional and is relieved with rest. Patient received sublingual nitroglycerin in the emergency room with immediate relief of chest pain. His serum troponin minimally elevated on serial measurements. Due to prior history of coronary atherosclerosis by CT angiogram in 2013, he was felt to be high risk, hence cut a catheterization was performed. This revealed mild coronary calcification, there were no unstable plaque. Patient stayed overnight as it was late to safely discharge patient home. Per RN, no hematoma, patient asymptomatic and no arrhythmias on telemetry, hence felt stable to be discharged with aggressive risk modification and management.  Recommendations on discharge: Patient discharged on atorvastatin 80 mg daily, aspirin 81 mg daily and amlodipine 5 g daily along with nitroglycerin on a when necessary basis. I will see him back in the office in 2 weeks.  Discharge Exam: Blood pressure 105/66, pulse 75, temperature  97.8 F (36.6 C), temperature source Oral, resp. rate (!) 34, height 5\' 8"  (1.727 m), weight 85.3 kg (188 lb), SpO2 100 %.    General appearance: alert, cooperative, appears stated age and no distress Resp: clear to auscultation bilaterally Cardio: regular rate and rhythm, S1, S2 normal, no murmur, click, rub or gallop GI: soft, non-tender; bowel sounds normal; no masses,  no organomegaly Extremities: extremities normal, atraumatic, no cyanosis or edema Pulses: 2+ and symmetric Neurologic: Grossly normal Labs:   Lab Results  Component Value Date   WBC 8.6 04/27/2016   HGB 14.4 04/27/2016   HCT 43.0 04/27/2016   MCV 88.3 04/27/2016   PLT 174 04/27/2016    Recent Labs Lab 04/26/16 1805 04/27/16 0730  NA 138  --   K 3.9  --   CL 102  --   CO2 24  --   BUN 17  --   CREATININE 1.03 1.12  CALCIUM 9.6  --   GLUCOSE 93  --     Lipid Panel     Component Value Date/Time   CHOL 235 (H) 04/27/2016 0730   TRIG 137 04/27/2016 0730   HDL 39 (L) 04/27/2016 0730   CHOLHDL 6.0 04/27/2016 0730   VLDL 27 04/27/2016 0730   LDLCALC 169 (H) 04/27/2016 0730     Recent Labs  04/27/16 0730 04/27/16 1057  TROPONINI 0.03* <0.03    Lab Results  Component Value Date   CKTOTAL 145 04/26/2011   CKMB 2.3 04/26/2011   TROPONINI <0.03 04/27/2016     TSH  Recent Labs  04/27/16 0730  TSH 1.490    EKG: NSR, No ischemia.   Radiology: Dg Chest 2 View  Result Date: 04/27/2016 CLINICAL DATA:  Acute onset of generalized chest tightness, shortness of breath and dizziness. Initial encounter. EXAM:  CHEST  2 VIEW COMPARISON:  Chest radiograph performed 06/27/2015 FINDINGS: The lungs are well-aerated. Vascular congestion is noted. Mildly increased interstitial markings may reflect minimal interstitial edema. There is no evidence of pleural effusion or pneumothorax. The heart is normal in size; the mediastinal contour is within normal limits. No acute osseous abnormalities are seen.  IMPRESSION: Vascular congestion. Mildly increased interstitial markings may reflect minimal interstitial edema. Electronically Signed   By: Roanna RaiderJeffery  Chang M.D.   On: 04/27/2016 00:13      FOLLOW UP PLANS AND APPOINTMENTS  Allergies as of 04/27/2016      Reactions   Gabapentin    Irritability      Medication List    STOP taking these medications   ibuprofen 800 MG tablet Commonly known as:  ADVIL,MOTRIN   indomethacin 75 MG CR capsule Commonly known as:  INDOCIN SR   lamoTRIgine 25 MG tablet Commonly known as:  LAMICTAL   naproxen 500 MG tablet Commonly known as:  NAPROSYN     TAKE these medications   amLODipine 5 MG tablet Commonly known as:  NORVASC Take 1 tablet (5 mg total) by mouth daily.   aspirin 81 MG EC tablet Take 1 tablet (81 mg total) by mouth daily. Start taking on:  04/28/2016   atorvastatin 80 MG tablet Commonly known as:  LIPITOR Take 1 tablet (80 mg total) by mouth daily at 6 PM.   nitroGLYCERIN 0.4 MG SL tablet Commonly known as:  NITROSTAT Place 1 tablet (0.4 mg total) under the tongue every 5 (five) minutes as needed for chest pain (CP or SOB).   traMADol 50 MG tablet Commonly known as:  ULTRAM TAKE ONE TABLET BY MOUTH EVERY 6 HOURS AS NEEDED      Follow-up Information    Call [name], Cloteal Isaacson, MD.   Specialty:  Cardiology Why:  To be seen in 2 weeks. Bring all medications Contact information: [address] [place] [address] [phone]            [name], Lemarcus Baggerly, MD 04/27/2016, 5:11 PM  Pager: [phone] Office: [phone] If no answer: [phone]

## 2016-04-27 NOTE — Progress Notes (Signed)
Took 3cc out of pt's TR band at 20:30, site looks good no bleeding at this time. Pt wants to go home in the morning rather than tonight. Dr. Jacinto HalimGanji notified, awaiting call back at this time.

## 2016-04-27 NOTE — Progress Notes (Addendum)
ANTICOAGULATION CONSULT NOTE - Initial Consult  Pharmacy Consult for Heparin Indication: chest pain/ACS  Allergies  Allergen Reactions  . Gabapentin     Irritability    Patient Measurements: Height: 5\' 8"  (172.7 cm) Weight: 195 lb (88.5 kg) IBW/kg (Calculated) : 68.4 Heparin Dosing Weight: 86 kg  Vital Signs: Temp: 97.9 F (36.6 C) (01/11 2115) BP: 122/76 (01/12 0800) Pulse Rate: 56 (01/12 0800)  Labs:  Recent Labs  04/26/16 1805 04/27/16 0730  HGB 14.8 14.4  HCT 43.6 43.0  PLT 195 174  LABPROT  --  11.9  INR  --  0.88  CREATININE 1.03 1.12  TROPONINI  --  0.03*    Estimated Creatinine Clearance: 92.8 mL/min (by C-G formula based on SCr of 1.12 mg/dL).   Medical History: Past Medical History:  Diagnosis Date  . Bilateral headaches    Being worked up by neurology - 01/2014  . Headache disorder 02/10/2014  . Hearing loss of right ear    Being worked up by ENT 01/2014  . Mild asthma   . OSA on CPAP    per study  05-04-2013 mild osa  . Traumatic rupture of left Achilles tendon   . Wears contact lenses     Medications:   (Not in a hospital admission) Scheduled:  . [START ON 04/28/2016] aspirin EC  81 mg Oral Daily  . atorvastatin  80 mg Oral q1800  . heparin  5,000 Units Subcutaneous Q8H  . metoprolol succinate  25 mg Oral Daily   Infusions:  . sodium chloride 150 mL/hr at 04/27/16 0808    Assessment: 40yo male with history of asthma presens with lightheadedness. Pharmacy is consulted to dose heparin for ACS/chest pain. Patient received subq heparin this morning so will give half bolus.  Goal of Therapy:  Heparin level 0.3-0.7 units/ml Monitor platelets by anticoagulation protocol: Yes   Plan:  Give 2000 units bolus x 1 Start heparin infusion at 1200 units/hr Check anti-Xa level in 6 hours and daily while on heparin Continue to monitor H&H and platelets  Arlean Hoppingorey M. Newman PiesBall, PharmD, BCPS Clinical Pharmacist Pager [phone] 04/27/2016,8:51 AM

## 2016-04-27 NOTE — Interval H&P Note (Signed)
History and Physical Interval Note:  04/27/2016 3:58 PM  Zachary Grant  has presented today for surgery, with the diagnosis of chest pain  The various methods of treatment have been discussed with the patient and family. After consideration of risks, benefits and other options for treatment, the patient has consented to  Procedure(s): Left Heart Cath and Coronary Angiography (N/A) and possible PCI as a surgical intervention .  The patient's history has been reviewed, patient examined, no change in status, stable for surgery.  I have reviewed the patient's chart and labs.  Questions were answered to the patient's satisfaction.   Cath Lab Visit (complete for each Cath Lab visit)  Clinical Evaluation Leading to the Procedure:   ACS: Yes.    Non-ACS:    Anginal Classification: CCS IV  Anti-ischemic medical therapy: No Therapy  Non-Invasive Test Results: No non-invasive testing performed  Prior CABG: No previous CABG  [name], [name]

## 2016-04-27 NOTE — ED Notes (Signed)
Attempted report x 2 

## 2016-04-27 NOTE — ED Notes (Addendum)
Critical Troponin 0.03 received from lab. Dr. Jacinto HalimGanji made aware.

## 2016-04-27 NOTE — Progress Notes (Signed)
Pt back to room from cath lab.  Report given to RN to wait 90 minutes before taking air out of TR band due to the amount of heparin pt received during cath.  Will carry out orders and continue to monitor.

## 2016-04-27 NOTE — Progress Notes (Signed)
RN took 3 cc of air out of TR band at 1830.  Pt began to bleed.  RN placed 3 cc of air back into band.  Bleeding stopped.  Will try again in 30 minutes.

## 2016-04-27 NOTE — H&P (Signed)
Zachary Grant is an 41 y.o. male.   Chief Complaint: Chest pain HPI: Zachary Grant  is a 41 y.o. male  With I had seen him in 2013 for preoperative evaluation of ankle injury.  I started him on low-dose of her blocker and atorvastatin at that time.  He had a negative nuclear stress test at that time and severe coronary calcification was evident on the CT scan.  He is not a heavy smoker, quit years ago, no family history of premature coronary artery disease would been doing well until yesterday the chest tightness while he was doing physical activity.  As chest pain Coming in the Form of Chest Tightness with Exertion Activity He Felt He Needed to Go to the Emergency Room.  Previously on Monday, He Was Evaluated for Bronchial Asthma in the Urgent Care Was Given Prednisone and Albuterol Inhaler with Partial Relief of Dyspnea, Continued to Have Chest Tightness on and off throughout the Day.  He Also Complains of Lightheadedness with This. Hence as the chest pain is ongoing with every physical activity since Monday, as symptoms got worse he presented to the emergency room for further evaluation.  In the Emergency Room He Was Found to Have Negative I-STAT Troponin Is and EKG Was Normal.  Due To Symptoms Suggestive of Angina Pectoris, As Chest Pain or Was Relieved with Sublingual Nitroglycerin, It Was Felt That He Needed Further Observation to Exclude ACS and Further Management and Evaluation.  Past Medical History:  Diagnosis Date  . Bilateral headaches    Being worked up by neurology - 01/2014  . Headache disorder 02/10/2014  . Hearing loss of right ear    Being worked up by ENT 01/2014  . Mild asthma   . OSA on CPAP    per study  05-04-2013 mild osa  . Traumatic rupture of left Achilles tendon   . Wears contact lenses     Past Surgical History:  Procedure Laterality Date  . ACHILLES TENDON REPAIR Left 2014   and removal spur  . ACHILLES TENDON SURGERY Left 09/04/2013   Procedure: LEFT ACHILLES  TENDON REPAIR ;  Surgeon: Francee Piccolo, MD;  Location: [facility];  Service: Podiatry;  Laterality: Left;  . CARDIOVASCULAR STRESS TEST  05-25-2011   DR Einar Gip   NORMAL PERFUSION/  NORMAL EF  . TONSILLECTOMY  age 29    Family History  Problem Relation Age of Onset  . Asthma Mother   . Depression Brother    Social History:  reports that he quit smoking about 7 years ago. His smoking use included Cigarettes. He has a 7.00 pack-year smoking history. He has never used smokeless tobacco. He reports that he drinks alcohol. He reports that he does not use drugs.  Allergies:  Allergies  Allergen Reactions  . Gabapentin     Irritability    Review of Systems - Negative except History of bronchial asthma and occasional wheezing. Chest pain and dizziness. Other systems negative.  Blood pressure 101/58, pulse (!) 51, temperature 97.9 F (36.6 C), resp. rate 13, height _0  (1.727 m), weight 88.5 kg (195 lb), SpO2 97 %.  Body mass index is 29.65 kg/m.  General appearance: alert, cooperative, appears stated age and no distress Eyes: negative findings: lids and lashes normal Neck: no adenopathy, no carotid bruit, no JVD, supple, symmetrical, trachea midline and thyroid not enlarged, symmetric, no tenderness/mass/nodules Neck: JVP - normal, carotids 2+= without bruits Resp: clear to auscultation bilaterally Chest wall: no tenderness Cardio: regular  rate and rhythm, S1, S2 normal, no murmur, click, rub or gallop GI: soft, non-tender; bowel sounds normal; no masses,  no organomegaly Extremities: extremities normal, atraumatic, no cyanosis or edema Pulses: 2+ and symmetric Skin: Skin color, texture, turgor normal. No rashes or lesions Neurologic: Grossly normal  Results for orders placed or performed during the hospital encounter of 04/26/16 (from the past 48 hour(s))  Basic metabolic panel     Status: None   Collection Time: 04/26/16  6:05 PM  Result Value Ref Range   Sodium 138  135 - 145 mmol/L   Potassium 3.9 3.5 - 5.1 mmol/L   Chloride 102 101 - 111 mmol/L   CO2 24 22 - 32 mmol/L   Glucose, Bld 93 65 - 99 mg/dL   BUN 17 6 - 20 mg/dL   Creatinine, Ser 1.03 0.61 - 1.24 mg/dL   Calcium 9.6 8.9 - 10.3 mg/dL   GFR calc non Af Amer >60 >60 mL/min   GFR calc Af Amer >60 >60 mL/min    Comment: (NOTE) The eGFR has been calculated using the CKD EPI equation. This calculation has not been validated in all clinical situations. eGFR's persistently <60 mL/min signify possible Chronic Kidney Disease.    Anion gap 12 5 - 15  CBC     Status: Abnormal   Collection Time: 04/26/16  6:05 PM  Result Value Ref Range   WBC 10.6 (H) 4.0 - 10.5 K/uL   RBC 4.96 4.22 - 5.81 MIL/uL   Hemoglobin 14.8 13.0 - 17.0 g/dL   HCT 43.6 39.0 - 52.0 %   MCV 87.9 78.0 - 100.0 fL   MCH 29.8 26.0 - 34.0 pg   MCHC 33.9 30.0 - 36.0 g/dL   RDW 12.8 11.5 - 15.5 %   Platelets 195 150 - 400 K/uL  Urinalysis, Routine w reflex microscopic     Status: Abnormal   Collection Time: 04/26/16 11:16 PM  Result Value Ref Range   Color, Urine STRAW (A) YELLOW   APPearance CLEAR CLEAR   Specific Gravity, Urine 1.011 1.005 - 1.030   pH 6.0 5.0 - 8.0   Glucose, UA NEGATIVE NEGATIVE mg/dL   Hgb urine dipstick NEGATIVE NEGATIVE   Bilirubin Urine NEGATIVE NEGATIVE   Ketones, ur NEGATIVE NEGATIVE mg/dL   Protein, ur NEGATIVE NEGATIVE mg/dL   Nitrite NEGATIVE NEGATIVE   Leukocytes, UA NEGATIVE NEGATIVE  D-dimer, quantitative (not at [facility])     Status: None   Collection Time: 04/27/16 12:30 AM  Result Value Ref Range   D-Dimer, Quant 0.36 0.00 - 0.50 ug/mL-FEU    Comment: (NOTE) At the manufacturer cut-off of 0.50 ug/mL FEU, this assay has been documented to exclude PE with a sensitivity and negative predictive value of 97 to 99%.  At this time, this assay has not been approved by the FDA to exclude DVT/VTE. Results should be correlated with clinical presentation.   I-stat troponin, ED     Status: None    Collection Time: 04/27/16 12:32 AM  Result Value Ref Range   Troponin i, poc 0.00 0.00 - 0.08 ng/mL   Comment 3            Comment: Due to the release kinetics of cTnI, a negative result within the first hours of the onset of symptoms does not rule out myocardial infarction with certainty. If myocardial infarction is still suspected, repeat the test at appropriate intervals.    Dg Chest 2 View  Result Date: 04/27/2016 CLINICAL DATA:  Acute onset of generalized chest tightness, shortness of breath and dizziness. Initial encounter. EXAM: CHEST  2 VIEW COMPARISON:  Chest radiograph performed 06/27/2015 FINDINGS: The lungs are well-aerated. Vascular congestion is noted. Mildly increased interstitial markings may reflect minimal interstitial edema. There is no evidence of pleural effusion or pneumothorax. The heart is normal in size; the mediastinal contour is within normal limits. No acute osseous abnormalities are seen. IMPRESSION: Vascular congestion. Mildly increased interstitial markings may reflect minimal interstitial edema. Electronically Signed   By: Garald Balding M.D.   On: 04/27/2016 00:13    Labs:   Lab Results  Component Value Date   WBC 10.6 (H) 04/26/2016   HGB 14.8 04/26/2016   HCT 43.6 04/26/2016   MCV 87.9 04/26/2016   PLT 195 04/26/2016    Recent Labs Lab 04/26/16 1805  NA 138  K 3.9  CL 102  CO2 24  BUN 17  CREATININE 1.03  CALCIUM 9.6  GLUCOSE 93    Lipid Panel     Component Value Date/Time   CHOL 207 (H) 04/26/2011 1225   TRIG 203 (H) 04/26/2011 1225   HDL 28 (L) 04/26/2011 1225   CHOLHDL 7.4 04/26/2011 1225   VLDL 41 (H) 04/26/2011 1225   LDLCALC 138 (H) 04/26/2011 1225   EKG: 04/27/2015: NSR. No ischemia.  Current Facility-Administered Medications:  .  acetaminophen (TYLENOL) tablet 650 mg, 650 mg, Oral, Q4H PRN, Adrian Prows, MD .  Derrill Memo ON 04/28/2016] aspirin EC tablet 81 mg, 81 mg, Oral, Daily, Adrian Prows, MD .  atorvastatin (LIPITOR) tablet  80 mg, 80 mg, Oral, q1800, Adrian Prows, MD .  heparin injection 5,000 Units, 5,000 Units, Subcutaneous, Q8H, Adrian Prows, MD .  metoprolol succinate (TOPROL-XL) 24 hr tablet 25 mg, 25 mg, Oral, Daily, Adrian Prows, MD .  nitroGLYCERIN (NITROSTAT) SL tablet 0.4 mg, 0.4 mg, Sublingual, Q5 min PRN, Everlene Balls, MD, 0.4 mg at 04/27/16 0141 .  ondansetron (ZOFRAN) injection 4 mg, 4 mg, Intravenous, Q6H PRN, Adrian Prows, MD  Current Outpatient Prescriptions:  .  traMADol (ULTRAM) 50 MG tablet, TAKE ONE TABLET BY MOUTH EVERY 6 HOURS AS NEEDED, Disp: 90 tablet, Rfl: 3   Assessment/Plan 1.  Chest pain suggestive of  Unstable angina pectoris, new onset exertional and relieved with sublingual nitroglycerin.  Physical examination and EKG is unremarkable,  serum troponin now trending up, d-dimer negative. 2.  Age advanced coronary atherosclerosis by Cardiac CT Angiogram performed on 04/26/2011 3. Mixed hyperlipidemia 4. H/o Tobacco use disorder  Rec: His symptoms are concerning for acute coronary syndrome with new onset of angina pectoris, serum troponins were trending upwards. He already has established coronary atheresclerosis. I will proceed with coronary angiogram. Patient's wife present at the bedside. I will start the patient on IV heparin.   Discussed risks, benefits and alternatives of angiogram including but not limited to <1% risk of death, stroke, MI, need for urgent surgical revascularization, renal failure, but not limited to thest. patient is willing to proceed.  Adrian Prows, MD 04/27/2016, 6:09 AM [facility]. [place] Pager: [phone] Office: [phone] If no answer: Cell:  [phone]

## 2016-04-28 LAB — CBC
HCT: 42.7 % (ref 39.0–52.0)
HEMOGLOBIN: 14.5 g/dL (ref 13.0–17.0)
MCH: 30.1 pg (ref 26.0–34.0)
MCHC: 34 g/dL (ref 30.0–36.0)
MCV: 88.6 fL (ref 78.0–100.0)
Platelets: 177 10*3/uL (ref 150–400)
RBC: 4.82 MIL/uL (ref 4.22–5.81)
RDW: 13 % (ref 11.5–15.5)
WBC: 7.8 10*3/uL (ref 4.0–10.5)

## 2016-04-28 LAB — LIPOPROTEIN A (LPA): Lipoprotein (a): 193 nmol/L — ABNORMAL HIGH (ref <75)

## 2016-04-28 NOTE — Progress Notes (Signed)
Patient discharged to home via wheelchair. VSS WNL. Discharge instructions given to patient and no further questions at this time. Telemetry box removed; CCMD removed. A.Vo RN

## 2016-04-28 NOTE — Progress Notes (Signed)
Flu immunization given per order. A.Calame, RN

## 2016-04-30 ENCOUNTER — Encounter (HOSPITAL_COMMUNITY): Payer: Self-pay | Admitting: Cardiology

## 2016-05-25 ENCOUNTER — Other Ambulatory Visit: Payer: Self-pay | Admitting: Neurology

## 2016-05-26 ENCOUNTER — Encounter: Payer: Self-pay | Admitting: Neurology

## 2016-05-28 NOTE — Telephone Encounter (Signed)
Faxed printed/signed rx tramadol by CW,MD to pt pharmacy. Fax: [phone]. Received confirmation.

## 2016-06-06 ENCOUNTER — Other Ambulatory Visit: Payer: Self-pay | Admitting: Occupational Medicine

## 2016-06-06 ENCOUNTER — Ambulatory Visit: Payer: Self-pay

## 2016-06-06 DIAGNOSIS — M79672 Pain in left foot: Secondary | ICD-10-CM

## 2016-06-13 ENCOUNTER — Ambulatory Visit: Payer: Self-pay

## 2016-06-13 ENCOUNTER — Other Ambulatory Visit: Payer: Self-pay | Admitting: Occupational Medicine

## 2016-06-13 DIAGNOSIS — M25572 Pain in left ankle and joints of left foot: Secondary | ICD-10-CM

## 2016-06-14 ENCOUNTER — Ambulatory Visit: Payer: Self-pay | Admitting: Neurology

## 2016-07-03 ENCOUNTER — Ambulatory Visit (INDEPENDENT_AMBULATORY_CARE_PROVIDER_SITE_OTHER): Payer: Worker's Compensation | Admitting: Orthopaedic Surgery

## 2016-08-13 ENCOUNTER — Other Ambulatory Visit: Payer: Self-pay | Admitting: Neurology

## 2016-08-14 ENCOUNTER — Telehealth: Payer: Self-pay | Admitting: *Deleted

## 2016-08-14 NOTE — Telephone Encounter (Signed)
Called and LVM for pt to call office and set up f/u with MM,NP per CW,MD request. We received refill request for tramadol. He was last seen 12/12/15. Please schedule f/u if patient calls, thank you.

## 2016-08-15 NOTE — Telephone Encounter (Signed)
Called and LVM again for pt to call office to set up f/u with MM,NP. Gave [facility] phone number.

## 2016-09-27 ENCOUNTER — Other Ambulatory Visit: Payer: Self-pay | Admitting: Neurology

## 2016-09-27 ENCOUNTER — Encounter: Payer: Self-pay | Admitting: Neurology

## 2016-10-01 ENCOUNTER — Other Ambulatory Visit: Payer: Self-pay | Admitting: Neurology

## 2016-10-01 MED ORDER — TRAMADOL HCL 50 MG PO TABS: 50.0000 mg | ORAL_TABLET | Freq: Four times a day (QID) | ORAL | 1 refills | Status: DC | PRN

## 2016-10-01 NOTE — Telephone Encounter (Signed)
Faxed printed/signed rx tramadol to pt pharmacy. Fax: [phone]. Received confirmation.  

## 2016-10-02 ENCOUNTER — Telehealth: Payer: Self-pay | Admitting: Neurology

## 2016-10-02 NOTE — Telephone Encounter (Signed)
error 

## 2016-10-25 ENCOUNTER — Ambulatory Visit: Payer: Worker's Compensation | Admitting: Neurology

## 2016-10-25 ENCOUNTER — Telehealth: Payer: Self-pay | Admitting: Neurology

## 2016-10-25 NOTE — Telephone Encounter (Signed)
This patient did not show for a revisit appointment today. 

## 2016-10-26 ENCOUNTER — Encounter: Payer: Self-pay | Admitting: Neurology

## 2016-10-28 ENCOUNTER — Emergency Department (HOSPITAL_COMMUNITY)
Admission: EM | Admit: 2016-10-28 | Discharge: 2016-10-28 | Disposition: A | Discharge status: Home / Self Care | Payer: 59 | Attending: Emergency Medicine | Admitting: Emergency Medicine

## 2016-10-28 ENCOUNTER — Encounter (HOSPITAL_COMMUNITY): Payer: Self-pay | Admitting: Emergency Medicine

## 2016-10-28 DIAGNOSIS — R42 Dizziness and giddiness: Secondary | ICD-10-CM | POA: Diagnosis present

## 2016-10-28 DIAGNOSIS — I252 Old myocardial infarction: Secondary | ICD-10-CM | POA: Insufficient documentation

## 2016-10-28 DIAGNOSIS — J45909 Unspecified asthma, uncomplicated: Secondary | ICD-10-CM | POA: Insufficient documentation

## 2016-10-28 DIAGNOSIS — H81399 Other peripheral vertigo, unspecified ear: Secondary | ICD-10-CM

## 2016-10-28 LAB — URINALYSIS, ROUTINE W REFLEX MICROSCOPIC
Bilirubin Urine: NEGATIVE
GLUCOSE, UA: NEGATIVE mg/dL
HGB URINE DIPSTICK: NEGATIVE
KETONES UR: NEGATIVE mg/dL
LEUKOCYTES UA: NEGATIVE
Nitrite: NEGATIVE
PH: 6 (ref 5.0–8.0)
Protein, ur: NEGATIVE mg/dL
Specific Gravity, Urine: 1.015 (ref 1.005–1.030)

## 2016-10-28 LAB — BASIC METABOLIC PANEL
Anion gap: 8 (ref 5–15)
BUN: 14 mg/dL (ref 6–20)
CHLORIDE: 103 mmol/L (ref 101–111)
CO2: 25 mmol/L (ref 22–32)
Calcium: 9.2 mg/dL (ref 8.9–10.3)
Creatinine, Ser: 1.21 mg/dL (ref 0.61–1.24)
GFR calc Af Amer: >60 mL/min (ref >60)
GFR calc non Af Amer: >60 mL/min (ref >60)
Glucose, Bld: 117 mg/dL — ABNORMAL HIGH (ref 65–99)
POTASSIUM: 3.8 mmol/L (ref 3.5–5.1)
SODIUM: 136 mmol/L (ref 135–145)

## 2016-10-28 LAB — CBC
HEMATOCRIT: 40.7 % (ref 39.0–52.0)
Hemoglobin: 14.1 g/dL (ref 13.0–17.0)
MCH: 30.7 pg (ref 26.0–34.0)
MCHC: 34.6 g/dL (ref 30.0–36.0)
MCV: 88.5 fL (ref 78.0–100.0)
Platelets: 178 10*3/uL (ref 150–400)
RBC: 4.6 MIL/uL (ref 4.22–5.81)
RDW: 13.1 % (ref 11.5–15.5)
WBC: 6.3 10*3/uL (ref 4.0–10.5)

## 2016-10-28 LAB — CBG MONITORING, ED: Glucose-Capillary: 99 mg/dL (ref 65–99)

## 2016-10-28 MED ORDER — MECLIZINE HCL 25 MG PO TABS: 25.0000 mg | ORAL_TABLET | Freq: Three times a day (TID) | ORAL | 0 refills | Status: DC | PRN

## 2016-10-28 MED ORDER — ONDANSETRON HCL 4 MG PO TABS: 4.0000 mg | ORAL_TABLET | Freq: Three times a day (TID) | ORAL | 0 refills | Status: DC | PRN

## 2016-10-28 MED ORDER — MECLIZINE HCL 25 MG PO TABS
50.0000 mg | ORAL_TABLET | Freq: Once | ORAL | Status: AC
  Administered 2016-10-28: 50 mg via ORAL
  Filled 2016-10-28: qty 2

## 2016-10-28 NOTE — ED Provider Notes (Signed)
[facility]-EMERGENCY DEPT Provider Note   CSN: [id number] Arrival date & time: 10/28/16  2016     History   Chief Complaint Chief Complaint  Patient presents with  . Dizziness    HPI Zachary Grant is a 41 y.o. male who presents with CC of vertigo. The patient states that he had sudden onset of dizziness, nausea and balance issues at 5:00 pm tonight. He has never had this issue before. He is able to walk but feels off balance with quick movements. He feels improved with his eyes fixed and sitting up. He feels worse when he lies back or closes his eyes. He Has a hx of sudden onset R ear hearing loss 4 years ago for which he has been worked up by both ENT and Neurology. The patient also denies any other neuro sxs. He does not smoke,   HPI  Past Medical History:  Diagnosis Date  . Bilateral headaches    Being worked up by neurology - 01/2014  . Headache disorder 02/10/2014  . Hearing loss of right ear    Being worked up by ENT 01/2014  . Mild asthma   . OSA on CPAP    per study  05-04-2013 mild osa  . Traumatic rupture of left Achilles tendon   . Wears contact lenses     Patient Active Problem List   Diagnosis Date Noted  . Unstable angina pectoris (HCC) 04/27/2016  . Coronary artery disease 04/27/2016  . Hearing loss of right ear 07/14/2014  . Headache disorder 02/10/2014  . OSA on CPAP 02/06/2014  . Asthma, chronic 02/06/2014  . Achilles rupture, left 02/06/2014    Past Surgical History:  Procedure Laterality Date  . ACHILLES TENDON REPAIR Left 2014   and removal spur  . ACHILLES TENDON SURGERY Left 09/04/2013   Procedure: LEFT ACHILLES TENDON REPAIR ;  Surgeon: Ludger Nutting, MD;  Location: [facility];  Service: Podiatry;  Laterality: Left;  . CARDIAC CATHETERIZATION N/A 04/27/2016   Procedure: Left Heart Cath and Coronary Angiography;  Surgeon: Yates Decamp, MD;  Location: [facility] INVASIVE CV LAB;  Service: Cardiovascular;  Laterality: N/A;  . CARDIOVASCULAR STRESS  TEST  05-25-2011   DR Jacinto Halim   NORMAL PERFUSION/  NORMAL EF  . TONSILLECTOMY  age 68       Home Medications    Prior to Admission medications   Medication Sig Start Date End Date Taking? Authorizing Provider  albuterol (PROVENTIL HFA;VENTOLIN HFA) 108 (90 Base) MCG/ACT inhaler Inhale 2 puffs into the lungs every 6 (six) hours as needed for wheezing or shortness of breath.   Yes [provider]  ibuprofen (ADVIL,MOTRIN) 200 MG tablet Take 400 mg by mouth every 6 (six) hours as needed for headache (pain).   Yes [provider]  nitroGLYCERIN (NITROSTAT) 0.4 MG SL tablet Place 1 tablet (0.4 mg total) under the tongue every 5 (five) minutes as needed for chest pain (CP or SOB). Patient taking differently: Place 0.4 mg under the tongue every 5 (five) minutes as needed for chest pain (shortness of breath).  04/27/16  Yes Yates Decamp, MD  traMADol (ULTRAM) 50 MG tablet Take 1 tablet (50 mg total) by mouth every 6 (six) hours as needed. Patient taking differently: Take 50 mg by mouth every 6 (six) hours as needed (pain).  10/01/16  Yes York Spaniel, MD  amLODipine (NORVASC) 5 MG tablet Take 1 tablet (5 mg total) by mouth daily. Patient not taking: Reported on 10/28/2016 04/27/16  [name], Jay, MD  aspirin EC 81 MG EC tablet Take 1 tablet (81 mg total) by mouth daily. Patient not taking: Reported on 10/28/2016 04/28/16   [name], Jay, MD  atorvastatin (LIPITOR) 80 MG tablet Take 1 tablet (80 mg total) by mouth daily at 6 PM. Patient not taking: Reported on 10/28/2016 04/27/16   [name], Jay, MD  meclizine (ANTIVERT) 25 MG tablet Take 1-2 tablets (25-50 mg total) by mouth 3 (three) times daily as needed for dizziness. 10/28/16   Arthor CaptainHarris, Quasean Frye, PA-C  ondansetron (ZOFRAN) 4 MG tablet Take 1 tablet (4 mg total) by mouth every 8 (eight) hours as needed for nausea or vomiting. 10/28/16   Arthor CaptainHarris, Havyn Ramo, PA-C    Family History Family History  Problem Relation Age of Onset  . Asthma Mother     . Depression Brother     Social History Social History  Substance Use Topics  . Smoking status: Former Smoker    Packs/day: 1.00    Years: 7.00    Types: Cigarettes    Quit date: 06/03/2008  . Smokeless tobacco: Never Used  . Alcohol use Yes     Comment: Occasionally      Allergies   Gabapentin   Review of Systems Review of Systems   Ten systems reviewed and are negative for acute change, except as noted in the HPI.   Physical Exam Updated Vital Signs BP 118/73   Pulse 61   Temp 97.8 F (36.6 C) (Oral)   Resp 16   SpO2 96%   Physical Exam  Constitutional: He appears well-developed and well-nourished. No distress.  HENT:  Head: Normocephalic and atraumatic.  Normal TMs BL  Eyes: Pupils are equal, round, and reactive to light. Conjunctivae and EOM are normal. No scleral icterus.  No nystagmus  Neck: Normal range of motion. Neck supple.  Cardiovascular: Normal rate, regular rhythm and normal heart sounds.   Pulmonary/Chest: Effort normal and breath sounds normal. No respiratory distress.  Abdominal: Soft. There is no tenderness.  Musculoskeletal: He exhibits no edema.  Neurological: He is alert.  Speech is clear and goal oriented, follows commands Major Cranial nerves without deficit, no facial droop Normal strength in upper and lower extremities bilaterally including dorsiflexion and plantar flexion, strong and equal grip strength Sensation normal to light and sharp touch Moves extremities without ataxia, coordination intact Normal finger to nose and rapid alternating movements no pronator drift Normal gait    Skin: Skin is warm and dry. He is not diaphoretic.  Psychiatric: His behavior is normal.  Nursing note and vitals reviewed.    ED Treatments / Results  Labs (all labs ordered are listed, but only abnormal results are displayed) Labs Reviewed  BASIC METABOLIC PANEL - Abnormal; Notable for the following:       Result Value   Glucose, Bld 117  (*)    All other components within normal limits  CBC  URINALYSIS, ROUTINE W REFLEX MICROSCOPIC  CBG MONITORING, ED    EKG  EKG Interpretation  Date/Time:  Sunday October 28 2016 22:09:14 EDT Ventricular Rate:  62 PR Interval:    QRS Duration: 95 QT Interval:  389 QTC Calculation: 395 R Axis:   18 Text Interpretation:  Sinus rhythm No significant change since last tracing Confirmed by Cathren LaineSteinl, Kevin ([id number]) on 10/28/2016 11:15:58 PM       Radiology No results found.  Procedures Procedures (including critical care time)  Medications Ordered in ED Medications  meclizine (ANTIVERT) tablet 50 mg (50 mg  Oral Given 10/28/16 2218)     Initial Impression / Assessment and Plan / ED Course  I have reviewed the triage vital signs and the nursing notes.  Pertinent labs & imaging results that were available during my care of the patient were reviewed by me and considered in my medical decision making (see chart for details).    Patient's symptoms are consistent with benign positional vertigo. I have no concern for central cause. His vertigo is positional. He has no other abnormal neurologic findings. His symptoms are markedly improved with meclizine. The patient has been seen in shared visit with Dr. Denton Lank, all who agrees with workup and plan for discharge. I discussed return precautions. He appears safe for discharge at this time  Final Clinical Impressions(s) / ED Diagnoses   Final diagnoses:  Peripheral vertigo, unspecified laterality    New Prescriptions Discharge Medication List as of 10/28/2016 11:35 PM    START taking these medications   Details  meclizine (ANTIVERT) 25 MG tablet Take 1-2 tablets (25-50 mg total) by mouth 3 (three) times daily as needed for dizziness., Starting Sun 10/28/2016, Print    ondansetron (ZOFRAN) 4 MG tablet Take 1 tablet (4 mg total) by mouth every 8 (eight) hours as needed for nausea or vomiting., Starting Sun 10/28/2016, Print          Tiburcio Pea, [place], PA-C 10/29/16 0041    Cathren Laine, MD 10/29/16 [phone]

## 2016-10-28 NOTE — ED Triage Notes (Signed)
Pt to ED c/o sudden onset dizziness at 1700 today, worse with standing up and movement at first. Pt reports taking phenergan afterward d/t intermittent nausea. Pt states he had a cardiac cath in Feb that came back normal but is here today because the dizziness is new. He is being worked up with neurology d/t sudden hearing loss and headaches - has had CT scans, MRI, and LP all coming back normal. Pt denies CP/SOB/N/V at this time. Resp e/u, skin warm/dry. PERRL.

## 2016-10-28 NOTE — Discharge Instructions (Signed)
Contact a health care provider if: °You have a fever. °Your condition gets worse or you develop new symptoms. °Your family or friends notice any behavioral changes. °Your nausea or vomiting gets worse. °You have numbness or a “pins and needles” sensation. °Get help right away if: °You have difficulty speaking or moving. °You are always dizzy. °You faint. °You develop severe headaches. °You have weakness in your legs or arms. °You have changes in your hearing or vision. °You develop a stiff neck. °You develop sensitivity to light. °

## 2016-11-17 ENCOUNTER — Other Ambulatory Visit: Payer: Self-pay | Admitting: Neurology

## 2016-11-19 ENCOUNTER — Other Ambulatory Visit: Payer: Self-pay | Admitting: Neurology

## 2016-11-19 ENCOUNTER — Encounter: Payer: Self-pay | Admitting: Adult Health

## 2016-11-19 MED ORDER — TRAMADOL HCL 50 MG PO TABS: 50.0000 mg | ORAL_TABLET | Freq: Four times a day (QID) | ORAL | 1 refills | Status: DC | PRN

## 2016-11-19 NOTE — Telephone Encounter (Signed)
Faxed printed/signed rx tramadol to pt pharmacy. Fax: [phone]. Received confirmation.

## 2017-01-07 ENCOUNTER — Other Ambulatory Visit: Payer: Self-pay | Admitting: Neurology

## 2017-01-08 ENCOUNTER — Other Ambulatory Visit: Payer: Self-pay | Admitting: Neurology

## 2017-01-09 ENCOUNTER — Encounter: Payer: Self-pay | Admitting: Adult Health

## 2017-01-09 ENCOUNTER — Other Ambulatory Visit: Payer: Self-pay | Admitting: Neurology

## 2017-01-09 MED ORDER — TRAMADOL HCL 50 MG PO TABS: 50.0000 mg | ORAL_TABLET | Freq: Four times a day (QID) | ORAL | 0 refills | Status: DC | PRN

## 2017-01-09 NOTE — Telephone Encounter (Signed)
Faxed printed/signed rx tramadol to [facility], [place] at [phone]. Received fax confirmation.

## 2017-01-30 ENCOUNTER — Other Ambulatory Visit: Payer: Self-pay | Admitting: Neurology

## 2017-02-06 ENCOUNTER — Other Ambulatory Visit: Payer: Self-pay | Admitting: Neurology

## 2017-02-06 MED ORDER — TRAMADOL HCL 50 MG PO TABS: 50.0000 mg | ORAL_TABLET | Freq: Four times a day (QID) | ORAL | 0 refills | Status: DC | PRN

## 2017-02-18 ENCOUNTER — Encounter: Payer: Self-pay | Admitting: Adult Health

## 2017-02-18 ENCOUNTER — Ambulatory Visit (INDEPENDENT_AMBULATORY_CARE_PROVIDER_SITE_OTHER): Payer: 59 | Admitting: Adult Health

## 2017-02-18 VITALS — BP 128/72 | HR 88 | Wt 196.2 lb

## 2017-02-18 DIAGNOSIS — M542 Cervicalgia: Secondary | ICD-10-CM | POA: Diagnosis not present

## 2017-02-18 DIAGNOSIS — G43019 Migraine without aura, intractable, without status migrainosus: Secondary | ICD-10-CM | POA: Diagnosis not present

## 2017-02-18 MED ORDER — TRAMADOL HCL 50 MG PO TABS: 50.0000 mg | ORAL_TABLET | Freq: Four times a day (QID) | ORAL | 3 refills | Status: DC | PRN

## 2017-02-18 NOTE — Progress Notes (Signed)
PATIENT: Zachary Grant DOB: 12-04-1975  REASON FOR VISIT: follow up HISTORY FROM: patient  HISTORY OF PRESENT ILLNESS: Zachary Grant is a 41 year old male with a history of daily headaches and neck pain.  He returns today for follow-up.  He continues to take tramadol on a daily basis for his ongoing neck and headache pain.  He reports that the pain starts at the base of the skull and tends to radiate to the right temporal region.  He does report phonophobia but denies photophobia.  He reports that he has muffled hearing in the right ear.  He reports that the pain is daily and essentially is always located at the base of the skull.  He denies any pain down the arms.  No weakness in the upper extremities.  He has tried several medications in the past including Topamax, indomethacin and Lamictal.  He reports none of these medications offered him any benefit.  He returns today for an evaluation.  HISTORY   REVIEW OF SYSTEMS: Out of a complete 14 system review of symptoms, the patient complains only of the following symptoms, and all other reviewed systems are negative.  HISTORY 12/12/15: Zachary Grant is a 41 year old male with a history of right frontotemporal headache. He returns today for follow-up. He reports that he is no longer taking indomethacin and Lamictal. He reports that he lost his job and therefore does not have insurance and was unable to get refills. He reports that he continues to have headaches in the right frontotemporal region. He reports that the headaches are typically a sharp pain that lasts for several minutes. He denies photophobia, phonophobia, nausea and vomiting. He states that these episodes can occur throughout the day. He does notice that they tend to get worse with changes in the barometric pressure. In the past he's had a complete workup that has been unremarkable. He also has tinnitus in the right ear as a result of hearing loss. He reports that the tone of the tinnitus changes  with his headaches. At times he also does notice some neck stiffness. When he was taking indomethacin and Lamictal he did notice some benefit. He returns today for an evaluation.  ALLERGIES: Allergies  Allergen Reactions  . Gabapentin Other (See Comments)    Irritability    HOME MEDICATIONS: Outpatient Medications Prior to Visit  Medication Sig Dispense Refill  . albuterol (PROVENTIL HFA;VENTOLIN HFA) 108 (90 Base) MCG/ACT inhaler Inhale 2 puffs into the lungs every 6 (six) hours as needed for wheezing or shortness of breath.    Marland Kitchen aspirin EC 81 MG EC tablet Take 1 tablet (81 mg total) by mouth daily.    Marland Kitchen ibuprofen (ADVIL,MOTRIN) 200 MG tablet Take 400 mg by mouth every 6 (six) hours as needed for headache (pain).    . nitroGLYCERIN (NITROSTAT) 0.4 MG SL tablet Place 1 tablet (0.4 mg total) under the tongue every 5 (five) minutes as needed for chest pain (CP or SOB). (Patient taking differently: Place 0.4 mg under the tongue every 5 (five) minutes as needed for chest pain (shortness of breath). ) 25 tablet 2  . traMADol (ULTRAM) 50 MG tablet Take 1 tablet (50 mg total) by mouth every 6 (six) hours as needed. 90 tablet 0  . amLODipine (NORVASC) 5 MG tablet Take 1 tablet (5 mg total) by mouth daily. (Patient not taking: Reported on 10/28/2016) 30 tablet 1  . atorvastatin (LIPITOR) 80 MG tablet Take 1 tablet (80 mg total) by mouth daily at 6  PM. (Patient not taking: Reported on 10/28/2016) 30 tablet 2  . meclizine (ANTIVERT) 25 MG tablet Take 1-2 tablets (25-50 mg total) by mouth 3 (three) times daily as needed for dizziness. (Patient not taking: Reported on 02/18/2017) 30 tablet 0  . ondansetron (ZOFRAN) 4 MG tablet Take 1 tablet (4 mg total) by mouth every 8 (eight) hours as needed for nausea or vomiting. (Patient not taking: Reported on 02/18/2017) 10 tablet 0   No facility-administered medications prior to visit.     PAST MEDICAL HISTORY: Past Medical History:  Diagnosis Date  . Bilateral  headaches    Being worked up by neurology - 01/2014  . Headache disorder 02/10/2014  . Hearing loss of right ear    Being worked up by ENT 01/2014  . Mild asthma   . OSA on CPAP    per study  05-04-2013 mild osa  . Traumatic rupture of left Achilles tendon   . Wears contact lenses     PAST SURGICAL HISTORY: Past Surgical History:  Procedure Laterality Date  . ACHILLES TENDON REPAIR Left 2014   and removal spur  . CARDIOVASCULAR STRESS TEST  05-25-2011   DR Jacinto Halim   NORMAL PERFUSION/  NORMAL EF  . TONSILLECTOMY  age 46    FAMILY HISTORY: Family History  Problem Relation Age of Onset  . Asthma Mother   . Depression Brother     SOCIAL HISTORY: Social History   Socioeconomic History  . Marital status: Married    Spouse name: Ashlee  . Number of children: 2  . Years of education: 12+  . Highest education level: Not on file  Social Needs  . Financial resource strain: Not on file  . Food insecurity - worry: Not on file  . Food insecurity - inability: Not on file  . Transportation needs - medical: Not on file  . Transportation needs - non-medical: Not on file  Occupational History  . Occupation: Materials engineer: [employer]    Comment: [employer]  Tobacco Use  . Smoking status: Former Smoker    Packs/day: 1.00    Years: 7.00    Pack years: 7.00    Types: Cigarettes    Last attempt to quit: 06/03/2008    Years since quitting: 8.7  . Smokeless tobacco: Never Used  Substance and Sexual Activity  . Alcohol use: Yes    Comment: Occasionally   . Drug use: No  . Sexual activity: Not on file  Other Topics Concern  . Not on file  Social History Narrative   Patient lives wife and her grandparents.   Patient is right handed   Patient drinks about 2 cups of caffeine daily.   His two children from a previous relationship live with their mother.      Maternal grandfather rheumatoid arthritis, Maternal grandfather had neurosarcoid.       PHYSICAL EXAM  Vitals:   02/18/17 1322  BP: 128/72  Pulse: 88  Weight: 196 lb 3.2 oz (89 kg)   Body mass index is 29.83 kg/m.  Generalized: Well developed, in no acute distress   Neurological examination  Mentation: Alert oriented to time, place, history taking. Follows all commands speech and language fluent Cranial nerve II-XII: Pupils were equal round reactive to light. Extraocular movements were full, visual field were full on confrontational test. Facial sensation and strength were normal. Uvula tongue midline. Head turning and shoulder shrug  were normal and symmetric. Motor: The motor  testing reveals 5 over 5 strength of all 4 extremities. Good symmetric motor tone is noted throughout.  Sensory: Sensory testing is intact to soft touch on all 4 extremities. No evidence of extinction is noted.  Coordination: Cerebellar testing reveals good finger-nose-finger and heel-to-shin bilaterally.  Gait and station: Gait is normal. Tandem gait is normal. Romberg is negative. No drift is seen.  Reflexes: Deep tendon reflexes are symmetric and normal bilaterally.   DIAGNOSTIC DATA (LABS, IMAGING, TESTING) - I reviewed patient records, labs, notes, testing and imaging myself where available.  Lab Results  Component Value Date   WBC 6.3 10/28/2016   HGB 14.1 10/28/2016   HCT 40.7 10/28/2016   MCV 88.5 10/28/2016   PLT 178 10/28/2016      Component Value Date/Time   NA 136 10/28/2016 2033   K 3.8 10/28/2016 2033   CL 103 10/28/2016 2033   CO2 25 10/28/2016 2033   GLUCOSE 117 (H) 10/28/2016 2033   BUN 14 10/28/2016 2033   CREATININE 1.21 10/28/2016 2033   CALCIUM 9.2 10/28/2016 2033   PROT 7.6 04/25/2012 1036   ALBUMIN 4.3 02/26/2014 1320   AST 28 04/25/2012 1036   ALT 42 04/25/2012 1036   ALKPHOS 74 04/25/2012 1036   BILITOT 0.3 04/25/2012 1036   GFRNONAA >60 10/28/2016 2033   GFRAA >60 10/28/2016 2033   Lab Results  Component Value Date   CHOL 235 (H) 04/27/2016    HDL 39 (L) 04/27/2016   LDLCALC 169 (H) 04/27/2016   TRIG 137 04/27/2016   CHOLHDL 6.0 04/27/2016    Lab Results  Component Value Date   TSH 1.490 04/27/2016      ASSESSMENT AND PLAN 41 y.o. year old male  has a past medical history of Bilateral headaches, Headache disorder (02/10/2014), Hearing loss of right ear, Mild asthma, OSA on CPAP, Traumatic rupture of left Achilles tendon, and Wears contact lenses. here with:  1. Neck pain 2. Migraine headaches   The patient continues to have daily neck pain and headaches.  He will continue to use tramadol to treat his pain and headache.  The patient has had an MRI of the brain that was relatively unremarkable.  I will order an MRI of the cervical spine to look for any acute changes that could be causing the ongoing neck pain.  Patient is amenable to this plan.  He will follow-up in 6 months with Zachary Grant.   Butch PennyMegan Johari Pinney, MSN, NP-C 02/18/2017, 2:08 PM [facility] [address], [address] [place], [place] [address] [phone]

## 2017-02-18 NOTE — Patient Instructions (Addendum)
Your Plan:  Continue tramadol as needed for headache/neck pain  MRI cervical spine  If your symptoms worsen or you develop new symptoms please let us know.   Thank you for coming to see [facility] at [facility]. I hope we have been able to provide you high quality care today.  You may receive a patient satisfaction survey over the next few weeks. We would appreciate your feedback and comments so that we may continue to improve ourselves and the health of our patients.

## 2017-02-18 NOTE — Progress Notes (Signed)
[facility] [address]

## 2017-02-18 NOTE — Progress Notes (Signed)
I have read the note, and I agree with the clinical assessment and plan.  Vanellope Passmore KEITH   

## 2017-02-27 ENCOUNTER — Ambulatory Visit (INDEPENDENT_AMBULATORY_CARE_PROVIDER_SITE_OTHER): Payer: 59

## 2017-02-27 DIAGNOSIS — M542 Cervicalgia: Secondary | ICD-10-CM

## 2017-02-27 DIAGNOSIS — G43019 Migraine without aura, intractable, without status migrainosus: Secondary | ICD-10-CM | POA: Diagnosis not present

## 2017-02-27 MED ORDER — GADOPENTETATE DIMEGLUMINE 469.01 MG/ML IV SOLN: 18.0000 mL | Freq: Once | INTRAVENOUS | Status: AC | PRN

## 2017-03-04 ENCOUNTER — Encounter: Payer: Self-pay | Admitting: Adult Health

## 2017-03-05 ENCOUNTER — Telehealth: Payer: Self-pay | Admitting: *Deleted

## 2017-03-05 DIAGNOSIS — M542 Cervicalgia: Secondary | ICD-10-CM

## 2017-03-05 NOTE — Telephone Encounter (Signed)
Spoke with patient and informed him that Aundra MilletMegan discussed his results with Dr. Anne HahnWillis. He does have anterior spondylosis and osteophyte disc protrusions at C4-C5. This potentially could be causing him some discomfort. Advised he will not need surgery, but Aundra MilletMegan can refer him for neuromuscular therapy. Aundra MilletMegan stated if that does not offer him any benefit we will consider medication. Patient stated he would like referral for therapy. This RN advised will let NP know he wants referral, and he can expect a call possibly next week to schedule.  Patient verbalized understanding, appreciation of call.

## 2017-03-27 ENCOUNTER — Encounter (HOSPITAL_COMMUNITY): Payer: Self-pay | Admitting: *Deleted

## 2017-03-27 ENCOUNTER — Emergency Department (HOSPITAL_COMMUNITY)
Admission: EM | Admit: 2017-03-27 | Discharge: 2017-03-27 | Disposition: A | Discharge status: Home / Self Care | Payer: 59 | Attending: Physician Assistant | Admitting: Physician Assistant

## 2017-03-27 ENCOUNTER — Emergency Department (HOSPITAL_COMMUNITY): Payer: 59

## 2017-03-27 DIAGNOSIS — W000XXA Fall on same level due to ice and snow, initial encounter: Secondary | ICD-10-CM | POA: Insufficient documentation

## 2017-03-27 DIAGNOSIS — M549 Dorsalgia, unspecified: Secondary | ICD-10-CM | POA: Diagnosis present

## 2017-03-27 DIAGNOSIS — J45909 Unspecified asthma, uncomplicated: Secondary | ICD-10-CM | POA: Diagnosis not present

## 2017-03-27 DIAGNOSIS — Z87891 Personal history of nicotine dependence: Secondary | ICD-10-CM | POA: Diagnosis not present

## 2017-03-27 DIAGNOSIS — Z7982 Long term (current) use of aspirin: Secondary | ICD-10-CM | POA: Insufficient documentation

## 2017-03-27 DIAGNOSIS — I2510 Atherosclerotic heart disease of native coronary artery without angina pectoris: Secondary | ICD-10-CM | POA: Insufficient documentation

## 2017-03-27 DIAGNOSIS — W19XXXA Unspecified fall, initial encounter: Secondary | ICD-10-CM

## 2017-03-27 MED ORDER — IBUPROFEN 800 MG PO TABS
800.0000 mg | ORAL_TABLET | Freq: Once | ORAL | Status: AC
  Administered 2017-03-27: 800 mg via ORAL
  Filled 2017-03-27: qty 1

## 2017-03-27 NOTE — ED Notes (Signed)
Bed: WLPT4 Expected date:  Expected time:  Means of arrival:  Comments: 

## 2017-03-27 NOTE — ED Triage Notes (Signed)
Per EMS, pt slipped and fell on ice, landing on right side. Pt has pain in right hip and right ribs, mid to lower back pain. Pt was able to ambulate. Pt hit his head, denies loss of consciousness, pt is not on blood thinners.  BP 132/78 HR 80 RR 18 CBG 128

## 2017-03-27 NOTE — ED Notes (Signed)
Bed: WLPT1 Expected date:  Expected time:  Means of arrival:  Comments: 

## 2017-03-27 NOTE — Discharge Instructions (Signed)
Rotate Tylenol and Ibuprofen for pain. Alternate using warm and/or cold compresses for pain. Apply compress for 20-30 minutes about 2-3 times per day. Complete mild stretches as tolerated.   Return to the ED if you notice any nausea, vomiting, persistent headaches, urinary/bowel incontinence, or any new or worsening symptoms.

## 2017-03-27 NOTE — ED Notes (Signed)
Bed: WA04 Expected date:  Expected time:  Means of arrival:  Comments: 

## 2017-03-27 NOTE — ED Provider Notes (Signed)
[facility]-EMERGENCY DEPT Provider Note   CSN: [id number] Arrival date & time: 03/27/17  4098     History   Chief Complaint Chief Complaint  Patient presents with  . Fall  . Hip Pain    HPI Zachary Grant is a 41 y.o. male.  HPI   Pt presents to the ED s/p fall around 0730 am this morning. Pt states that he slipped and fell backwards onto his back on the ice. He denies any prodrome to this fall. Pt reports that he did hit his head, but denies any loss of consciousness or amnesia of the event. He was able to stand and ambulate following the fall. He c/o a HA, 7/10 mid right sided back pain, and right hip pain. He denies any vision changes, changes in mentation, NV, ataxia, numbness/weakness to the legs, loss of control bowel or bladder, or pain to any other area of the body. He denies any lacerations or bruising.  He denies that he is on any blood thinners.   Past Medical History:  Diagnosis Date  . Bilateral headaches    Being worked up by neurology - 01/2014  . Headache disorder 02/10/2014  . Hearing loss of right ear    Being worked up by ENT 01/2014  . Mild asthma   . OSA on CPAP    per study  05-04-2013 mild osa  . Traumatic rupture of left Achilles tendon   . Wears contact lenses     Patient Active Problem List   Diagnosis Date Noted  . Unstable angina pectoris (HCC) 04/27/2016  . Coronary artery disease 04/27/2016  . Hearing loss of right ear 07/14/2014  . Headache disorder 02/10/2014  . OSA on CPAP 02/06/2014  . Asthma, chronic 02/06/2014  . Achilles rupture, left 02/06/2014    Past Surgical History:  Procedure Laterality Date  . ACHILLES TENDON REPAIR Left 2014   and removal spur  . ACHILLES TENDON SURGERY Left 09/04/2013   Procedure: LEFT ACHILLES TENDON REPAIR ;  Surgeon: Ludger Nutting, MD;  Location: [facility];  Service: Podiatry;  Laterality: Left;  . CARDIAC CATHETERIZATION N/A 04/27/2016   Procedure: Left Heart Cath  and Coronary Angiography;  Surgeon: Yates Decamp, MD;  Location: [facility] INVASIVE CV LAB;  Service: Cardiovascular;  Laterality: N/A;  . CARDIOVASCULAR STRESS TEST  05-25-2011   DR Jacinto Halim   NORMAL PERFUSION/  NORMAL EF  . TONSILLECTOMY  age 73       Home Medications    Prior to Admission medications   Medication Sig Start Date End Date Taking? Authorizing Provider  albuterol (PROVENTIL HFA;VENTOLIN HFA) 108 (90 Base) MCG/ACT inhaler Inhale 2 puffs into the lungs every 6 (six) hours as needed for wheezing or shortness of breath.    [provider]  amLODipine (NORVASC) 5 MG tablet Take 1 tablet (5 mg total) by mouth daily. Patient not taking: Reported on 10/28/2016 04/27/16   Yates Decamp, MD  aspirin EC 81 MG EC tablet Take 1 tablet (81 mg total) by mouth daily. 04/28/16   Yates Decamp, MD  atorvastatin (LIPITOR) 80 MG tablet Take 1 tablet (80 mg total) by mouth daily at 6 PM. Patient not taking: Reported on 10/28/2016 04/27/16   Yates Decamp, MD  ibuprofen (ADVIL,MOTRIN) 200 MG tablet Take 400 mg by mouth every 6 (six) hours as needed for headache (pain).    [provider]  meclizine (ANTIVERT) 25 MG tablet Take 1-2 tablets (25-50 mg total) by mouth 3 (three)  times daily as needed for dizziness. Patient not taking: Reported on 02/18/2017 10/28/16   Arthor Captain, PA-C  nitroGLYCERIN (NITROSTAT) 0.4 MG SL tablet Place 1 tablet (0.4 mg total) under the tongue every 5 (five) minutes as needed for chest pain (CP or SOB). Patient taking differently: Place 0.4 mg under the tongue every 5 (five) minutes as needed for chest pain (shortness of breath).  04/27/16   Yates Decamp, MD  ondansetron (ZOFRAN) 4 MG tablet Take 1 tablet (4 mg total) by mouth every 8 (eight) hours as needed for nausea or vomiting. Patient not taking: Reported on 02/18/2017 10/28/16   Arthor Captain, PA-C  traMADol (ULTRAM) 50 MG tablet Take 1 tablet (50 mg total) every 6 (six) hours as needed by mouth. 02/18/17   Butch Penny,  NP    Family History Family History  Problem Relation Age of Onset  . Asthma Mother   . Depression Brother     Social History Social History   Tobacco Use  . Smoking status: Former Smoker    Packs/day: 1.00    Years: 7.00    Pack years: 7.00    Types: Cigarettes    Last attempt to quit: 06/03/2008    Years since quitting: 8.8  . Smokeless tobacco: Never Used  Substance Use Topics  . Alcohol use: Yes    Comment: Occasionally   . Drug use: No     Allergies   Gabapentin   Review of Systems Review of Systems  Constitutional: Negative for chills and fever.  HENT: Negative for ear pain and sore throat.   Eyes: Negative for pain and visual disturbance.  Respiratory: Negative for cough, shortness of breath and wheezing.   Cardiovascular: Negative for chest pain and palpitations.  Gastrointestinal: Negative for abdominal pain, nausea and vomiting.  Genitourinary: Negative for dysuria and hematuria.  Musculoskeletal: Positive for back pain ( right, mid back). Negative for arthralgias, gait problem, neck pain and neck stiffness.       Right hip pain  Skin: Negative for color change, rash and wound.       No lacerations, abrasions, or bruising  Neurological: Positive for headaches. Negative for seizures, syncope, weakness, light-headedness and numbness.       No LOC. No ataxia. No numbness/weakness to the BLE. No bowel or bladder incontinence.   All other systems reviewed and are negative.       Physical Exam Updated Vital Signs BP 110/71 (BP Location: Left Arm)   Pulse 74   Temp 98.2 F (36.8 C) (Oral)   Resp 18   SpO2 99%   Physical Exam  Constitutional: He is oriented to person, place, and time. He appears well-developed and well-nourished. No distress.  HENT:  Head: Normocephalic.  Right Ear: External ear normal.  Left Ear: External ear normal.  Nose: Nose normal.  No battle signs, no raccoons eyes, no rhinorrhea. No deformity or crepitus noted. No  hemotympanum. No TTP or crepitus to the scalp.   Eyes: Conjunctivae and EOM are normal. Pupils are equal, round, and reactive to light.  Neck: Normal range of motion. Neck supple.  No c-spine TTP  Cardiovascular: Normal rate, regular rhythm and normal heart sounds.  No murmur heard. Pulmonary/Chest: Effort normal and breath sounds normal. No respiratory distress.  Abdominal: Soft. There is no tenderness.  Musculoskeletal: Normal range of motion. He exhibits no edema.  Pt has TTP to the inferior portion of thoracic spine and the superior portion of the lumbar spine. Pt also has  TTP to the right paralumbar region and SI joint. There is no hematoma. Flexion and extension of the back is intact.   Neurological: He is alert and oriented to person, place, and time. No cranial nerve deficit or sensory deficit. He exhibits normal muscle tone. Coordination normal.  Pt able to ambulate without ataxia. No difficulty with ambulation on the toes and heels.  Skin: Skin is warm and dry.  No abrasions or lacerations noted.  Psychiatric: He has a normal mood and affect.  Nursing note and vitals reviewed.    ED Treatments / Results  Labs (all labs ordered are listed, but only abnormal results are displayed) Labs Reviewed - No data to display  EKG  EKG Interpretation None       Radiology Dg Lumbar Spine Complete  Result Date: 03/27/2017 CLINICAL DATA:  Fall this morning with back pain. EXAM: LUMBAR SPINE - COMPLETE 4+ VIEW COMPARISON:  07/02/2011 lumbar spine radiographs FINDINGS: This report assumes 5 non rib-bearing lumbar vertebrae. Lumbar vertebral body heights are preserved, with no fracture. Mild multilevel degenerative disc disease in the visualized thoracolumbar spine, mildly progressed in the interval. Minimal 2 mm retrolisthesis at L4-5, not appreciably changed. No significant facet arthropathy. No aggressive appearing focal osseous lesions. IMPRESSION: 1. No lumbar spine fracture or acute  malalignment. 2. Mild overall progression of mild multilevel lumbar degenerative disc disease. 3. Stable minimal 2 mm retrolisthesis at L4-5. Electronically Signed   By: Delbert PhenixJason A Poff M.D.   On: 03/27/2017 10:35    Procedures Procedures (including critical care time)  Medications Ordered in ED Medications  ibuprofen (ADVIL,MOTRIN) tablet 800 mg (800 mg Oral Given 03/27/17 1000)     Initial Impression / Assessment and Plan / ED Course  I have reviewed the triage vital signs and the nursing notes.  Pertinent labs & imaging results that were available during my care of the patient were reviewed by me and considered in my medical decision making (see chart for details).   935 Evaluated pt. In the ED. Orders placed.   1139 Rechecked pt with Michela PitcherMina Fawze, PA. Pt states that pain has improved since onset. He is still mentating at baseline. He denies NV and is able to ambulate without ataxia. Discussed xray results and plan for discharge. Advised pt to rotate Tylenol and Ibuprofen for pain. He can also alternate using warm and/or cold compresses for pain. Apply compress for 20-30 minutes about 2-3 times per day. Complete mild stretches as tolerated. Pt understands the plan and is agreeable for discharge with outpatient f/u as needed. He should return to the ED for any bowel/bladder incontinence, numbness/weakness to the BLE, persistent HA's, NV, or any other new or concerning symptoms. All questions answered.   Final Clinical Impressions(s) / ED Diagnoses   Final diagnoses:  Fall, initial encounter  Acute right-sided back pain, unspecified back location   Pt presents s/p fall with mild HA and back pain. Pt low risk for intracranial bleed based on history and physical exam. No use of blood thinners, no LOC, no NV, and no focal neurologic deficits. Xray of thoracic and lumbar spine showed chronic changes, but no acute fracture or abnormality. Pt able to ambulate without difficulty, and has FROM of the  back. He has no symptoms or PEx findings suggestive of cauda equina syndrome. Stable for discharge with strict return precautions and outpatient f/u.  ED Discharge Orders    None       Karrie MeresCouture, Bob Daversa S, [place]PA-C 03/27/17 1708  Abelino DerrickMackuen, Courteney Lyn, MD 03/28/17 [phone]

## 2017-03-27 NOTE — ED Provider Notes (Signed)
Patient with right sided low back pain secondary to fall.  He did hit the back of his head but denies loss of consciousness, nausea, vomiting, amnesia, neck pain, or significant headache at this time.  No indication for advanced imaging of the head or neck and I have a low suspicion of ICH, SAH, skull fracture, or cervical spine injury.  No focal neurological deficits.  No red flag signs concerning for cauda equina.  He is ambulatory without difficulty.  Radiographs of the lumbar spine show no acute osseous abnormality and there is no evidence of compression fracture. RICE  therapy indicated and discussed with patient.  Stable for discharge home with follow-up with primary care physician for reevaluation.  Discussed indications for return to the ED. Pt verbalized understanding of and agreement with plan and is safe for discharge home at this time.    Jeanie SewerFawze, Elbert Polyakov A, PA-C 03/27/17 1151    Jeanie SewerFawze, Draken Farrior A, PA-C 03/27/17 1151    Abelino DerrickMackuen, Courteney Lyn, MD 03/28/17 0930

## 2017-05-26 ENCOUNTER — Other Ambulatory Visit: Payer: Self-pay | Admitting: Adult Health

## 2017-05-27 ENCOUNTER — Other Ambulatory Visit: Payer: Self-pay | Admitting: Adult Health

## 2017-05-27 ENCOUNTER — Encounter: Payer: Self-pay | Admitting: Adult Health

## 2017-05-28 NOTE — Telephone Encounter (Signed)
Faxed printed/signed rx tramadol to [facility]/[address] at [phone]. Received fax confirmation.

## 2017-05-28 NOTE — Telephone Encounter (Signed)
This was already faxed. See other phone note.

## 2017-08-15 ENCOUNTER — Encounter: Payer: Self-pay | Admitting: Neurology

## 2017-08-18 ENCOUNTER — Emergency Department (HOSPITAL_COMMUNITY)
Admission: EM | Admit: 2017-08-18 | Discharge: 2017-08-18 | Disposition: A | Discharge status: Home / Self Care | Payer: 59 | Attending: Emergency Medicine | Admitting: Emergency Medicine

## 2017-08-18 ENCOUNTER — Other Ambulatory Visit: Payer: Self-pay

## 2017-08-18 DIAGNOSIS — Z7982 Long term (current) use of aspirin: Secondary | ICD-10-CM | POA: Insufficient documentation

## 2017-08-18 DIAGNOSIS — Z87891 Personal history of nicotine dependence: Secondary | ICD-10-CM | POA: Insufficient documentation

## 2017-08-18 DIAGNOSIS — F1123 Opioid dependence with withdrawal: Secondary | ICD-10-CM | POA: Diagnosis present

## 2017-08-18 DIAGNOSIS — J45909 Unspecified asthma, uncomplicated: Secondary | ICD-10-CM | POA: Diagnosis not present

## 2017-08-18 DIAGNOSIS — I2510 Atherosclerotic heart disease of native coronary artery without angina pectoris: Secondary | ICD-10-CM | POA: Insufficient documentation

## 2017-08-18 DIAGNOSIS — F1193 Opioid use, unspecified with withdrawal: Secondary | ICD-10-CM

## 2017-08-18 LAB — COMPREHENSIVE METABOLIC PANEL
ALT: 29 U/L (ref 17–63)
AST: 23 U/L (ref 15–41)
Albumin: 4.2 g/dL (ref 3.5–5.0)
Alkaline Phosphatase: 78 U/L (ref 38–126)
Anion gap: 10 (ref 5–15)
BUN: 10 mg/dL (ref 6–20)
CHLORIDE: 105 mmol/L (ref 101–111)
CO2: 25 mmol/L (ref 22–32)
CREATININE: 1.17 mg/dL (ref 0.61–1.24)
Calcium: 9.5 mg/dL (ref 8.9–10.3)
GFR calc Af Amer: >60 mL/min (ref >60)
GFR calc non Af Amer: >60 mL/min (ref >60)
Glucose, Bld: 135 mg/dL — ABNORMAL HIGH (ref 65–99)
Potassium: 3.8 mmol/L (ref 3.5–5.1)
SODIUM: 140 mmol/L (ref 135–145)
Total Bilirubin: 0.7 mg/dL (ref 0.3–1.2)
Total Protein: 7.2 g/dL (ref 6.5–8.1)

## 2017-08-18 LAB — CBC
HCT: 44.1 % (ref 39.0–52.0)
Hemoglobin: 15.1 g/dL (ref 13.0–17.0)
MCH: 30.4 pg (ref 26.0–34.0)
MCHC: 34.2 g/dL (ref 30.0–36.0)
MCV: 88.9 fL (ref 78.0–100.0)
PLATELETS: 176 10*3/uL (ref 150–400)
RBC: 4.96 MIL/uL (ref 4.22–5.81)
RDW: 12.8 % (ref 11.5–15.5)
WBC: 7.5 10*3/uL (ref 4.0–10.5)

## 2017-08-18 LAB — RAPID URINE DRUG SCREEN, HOSP PERFORMED
Amphetamines: NOT DETECTED
BENZODIAZEPINES: NOT DETECTED
Barbiturates: NOT DETECTED
Cocaine: NOT DETECTED
OPIATES: NOT DETECTED
Tetrahydrocannabinol: NOT DETECTED

## 2017-08-18 LAB — ETHANOL: Alcohol, Ethyl (B): <10 mg/dL (ref <10)

## 2017-08-18 MED ORDER — HYDROXYZINE HCL 25 MG PO TABS: 25.0000 mg | ORAL_TABLET | Freq: Three times a day (TID) | ORAL | 0 refills | Status: DC | PRN

## 2017-08-18 MED ORDER — DICYCLOMINE HCL 20 MG PO TABS: 20.0000 mg | ORAL_TABLET | Freq: Three times a day (TID) | ORAL | 0 refills | Status: DC

## 2017-08-18 NOTE — ED Triage Notes (Signed)
Patient reports that he has been taking tramadol x 4 years for head pain after being prescribed for head pain. Last dose yesterday at 0100. States that he chose to stop the meds. Complains of withdrawal. States he had restlessness yesterday. Complains of joint pain. Alert and oriented. Has been taking in increased fluids. Alert and oriented

## 2017-08-18 NOTE — ED Provider Notes (Signed)
[facility] EMERGENCY DEPARTMENT Provider Note   CSN: [id number] Arrival date & time: 08/18/17  0919     History   Chief Complaint No chief complaint on file.   HPI Stevie Charter is a 42 y.o. male.  HPI   42 year old male presenting with complaints of opiate withdrawal.  Patient admits to taking tramadol for the past 4 years for recurrent headache.  He admits that he is addicted to the medication but decided to stop taking the medication since yesterday.  He is currently complaining of having withdrawal including feeling restlessness and having joint pain.  Patient states he normally takes tramadol 4 times daily for the past 4 years.  He was taking it for his chronic back pain and neck pain as well as headache.  He was told that has a bulging disc and is currently seeking for help to address his situation.  He strongly wanted to stop taking tramadol and decided to quit taking it last night.  Now complaining of restlessness in his arms, having some mild joint discomfort but denies any nausea vomiting or abdominal cramping.  Does endorse increasing neck and lower back pain since without his medication.  He would like to additional resources to help with his detox.  No report of SI/HI/auditory or visual hallucination.  Patient is a social drinker.  Past Medical History:  Diagnosis Date  . Bilateral headaches    Being worked up by neurology - 01/2014  . Headache disorder 02/10/2014  . Hearing loss of right ear    Being worked up by ENT 01/2014  . Mild asthma   . OSA on CPAP    per study  05-04-2013 mild osa  . Traumatic rupture of left Achilles tendon   . Wears contact lenses     Patient Active Problem List   Diagnosis Date Noted  . Unstable angina pectoris (HCC) 04/27/2016  . Coronary artery disease 04/27/2016  . Hearing loss of right ear 07/14/2014  . Headache disorder 02/10/2014  . OSA on CPAP 02/06/2014  . Asthma, chronic 02/06/2014  . Achilles rupture, left  02/06/2014    Past Surgical History:  Procedure Laterality Date  . ACHILLES TENDON REPAIR Left 2014   and removal spur  . ACHILLES TENDON SURGERY Left 09/04/2013   Procedure: LEFT ACHILLES TENDON REPAIR ;  Surgeon: Ludger Nutting, MD;  Location: [facility];  Service: Podiatry;  Laterality: Left;  . CARDIAC CATHETERIZATION N/A 04/27/2016   Procedure: Left Heart Cath and Coronary Angiography;  Surgeon: Yates Decamp, MD;  Location: [facility] INVASIVE CV LAB;  Service: Cardiovascular;  Laterality: N/A;  . CARDIOVASCULAR STRESS TEST  05-25-2011   DR Jacinto Halim   NORMAL PERFUSION/  NORMAL EF  . TONSILLECTOMY  age 21        Home Medications    Prior to Admission medications   Medication Sig Start Date End Date Taking? Authorizing Provider  albuterol (PROVENTIL HFA;VENTOLIN HFA) 108 (90 Base) MCG/ACT inhaler Inhale 2 puffs into the lungs every 6 (six) hours as needed for wheezing or shortness of breath.    [provider]  amLODipine (NORVASC) 5 MG tablet Take 1 tablet (5 mg total) by mouth daily. Patient not taking: Reported on 10/28/2016 04/27/16   Yates Decamp, MD  aspirin EC 81 MG EC tablet Take 1 tablet (81 mg total) by mouth daily. 04/28/16   Yates Decamp, MD  atorvastatin (LIPITOR) 80 MG tablet Take 1 tablet (80 mg total) by mouth daily at 6 PM.  Patient not taking: Reported on 10/28/2016 04/27/16   Yates Decamp, MD  ibuprofen (ADVIL,MOTRIN) 200 MG tablet Take 400 mg by mouth every 6 (six) hours as needed for headache (pain).    [provider]  meclizine (ANTIVERT) 25 MG tablet Take 1-2 tablets (25-50 mg total) by mouth 3 (three) times daily as needed for dizziness. Patient not taking: Reported on 02/18/2017 10/28/16   Arthor Captain, PA-C  nitroGLYCERIN (NITROSTAT) 0.4 MG SL tablet Place 1 tablet (0.4 mg total) under the tongue every 5 (five) minutes as needed for chest pain (CP or SOB). Patient taking differently: Place 0.4 mg under the tongue every 5 (five) minutes as needed for  chest pain (shortness of breath).  04/27/16   Yates Decamp, MD  ondansetron (ZOFRAN) 4 MG tablet Take 1 tablet (4 mg total) by mouth every 8 (eight) hours as needed for nausea or vomiting. Patient not taking: Reported on 02/18/2017 10/28/16   Arthor Captain, PA-C  traMADol (ULTRAM) 50 MG tablet TAKE 1 TABLET BY MOUTH EVERY 6 HOURS AS NEEDED 05/27/17   York Spaniel, MD    Family History Family History  Problem Relation Age of Onset  . Asthma Mother   . Depression Brother     Social History Social History   Tobacco Use  . Smoking status: Former Smoker    Packs/day: 1.00    Years: 7.00    Pack years: 7.00    Types: Cigarettes    Last attempt to quit: 06/03/2008    Years since quitting: 9.2  . Smokeless tobacco: Never Used  Substance Use Topics  . Alcohol use: Yes    Comment: Occasionally   . Drug use: No     Allergies   Gabapentin   Review of Systems Review of Systems  All other systems reviewed and are negative.    Physical Exam Updated Vital Signs BP (!) 129/91   Pulse 77   Temp 98.1 F (36.7 C) (Oral)   Resp 18   SpO2 100%   Physical Exam  Constitutional: He is oriented to person, place, and time. He appears well-developed and well-nourished. No distress.  HENT:  Head: Atraumatic.  Eyes: Conjunctivae are normal.  Neck: Neck supple.  Cardiovascular: Normal rate and regular rhythm.  Pulmonary/Chest: Effort normal and breath sounds normal.  Neurological: He is alert and oriented to person, place, and time.  Skin: No rash noted.  Psychiatric: He has a normal mood and affect. His speech is normal and behavior is normal. Thought content is not paranoid. He expresses no homicidal and no suicidal ideation.  Nursing note and vitals reviewed.    ED Treatments / Results  Labs (all labs ordered are listed, but only abnormal results are displayed) Labs Reviewed  COMPREHENSIVE METABOLIC PANEL - Abnormal; Notable for the following components:      Result Value     Glucose, Bld 135 (*)    All other components within normal limits  ETHANOL  CBC  RAPID URINE DRUG SCREEN, HOSP PERFORMED    EKG None  Radiology No results found.  Procedures Procedures (including critical care time)  Medications Ordered in ED Medications - No data to display   Initial Impression / Assessment and Plan / ED Course  I have reviewed the triage vital signs and the nursing notes.  Pertinent labs & imaging results that were available during my care of the patient were reviewed by me and considered in my medical decision making (see chart for details).  BP (!) 129/91   Pulse 77   Temp 98.1 F (36.7 C) (Oral)   Resp 18   SpO2 100%    Final Clinical Impressions(s) / ED Diagnoses   Final diagnoses:  Opioid withdrawal [facility])    ED Discharge Orders        Ordered    hydrOXYzine (ATARAX/VISTARIL) 25 MG tablet  Every 8 hours PRN     08/18/17 1137    dicyclomine (BENTYL) 20 MG tablet  3 times daily before meals & bedtime     08/18/17 1137     11:35 AM Patient here for withdrawal symptoms from quitting tramadol which she has been taking for the past 4 years.  He is resting comfortably in no acute discomfort.  Vital signs stable.  Will provide symptomatic treatment and will give outpatient  resources for detox.   Fayrene Helper, PA-C 08/18/17 1138    Cathren Laine, MD 08/18/17 [phone]

## 2017-08-19 ENCOUNTER — Other Ambulatory Visit: Payer: Self-pay | Admitting: Neurology

## 2017-08-19 ENCOUNTER — Telehealth: Payer: Self-pay | Admitting: Neurology

## 2017-08-19 ENCOUNTER — Ambulatory Visit: Payer: 59 | Admitting: Neurology

## 2017-08-19 MED ORDER — TRAMADOL HCL 50 MG PO TABS: 50.0000 mg | ORAL_TABLET | Freq: Four times a day (QID) | ORAL | 0 refills | Status: DC | PRN

## 2017-08-19 NOTE — Telephone Encounter (Signed)
This patient cancelled the same day of a RV appointment today.

## 2017-08-20 ENCOUNTER — Encounter: Payer: Self-pay | Admitting: Neurology

## 2017-09-12 ENCOUNTER — Other Ambulatory Visit: Payer: Self-pay | Admitting: Neurology

## 2017-09-15 ENCOUNTER — Other Ambulatory Visit: Payer: Self-pay | Admitting: Neurology

## 2017-09-16 NOTE — Telephone Encounter (Signed)
Rx registry checked. Patient's last Rx was refilled in pieces:  08/20/17: given #28 08/27/17: given #28 09/02/17: given #28 09/10/17: given #6

## 2017-09-17 NOTE — Telephone Encounter (Signed)
Rx electronically sent by Dr. Willis 

## 2017-10-11 ENCOUNTER — Other Ambulatory Visit: Payer: Self-pay | Admitting: Neurology

## 2017-10-11 MED ORDER — TRAMADOL HCL 50 MG PO TABS: 50.0000 mg | ORAL_TABLET | Freq: Four times a day (QID) | ORAL | 0 refills | Status: DC | PRN

## 2017-10-14 NOTE — Telephone Encounter (Addendum)
I checked [place] [facility] Registry and he last refilled rx on 09/16/17 qty 90.  Not receiving from other MD's. He was last seen 02/18/17. No showed on 08/19/17. No appt pending. Pt needs appt for refills. However, he has a balance with our office. He will need to contact our office about refill request. Will deny stating he needs appt and needs to speak with billing prior to making appt.

## 2017-11-07 ENCOUNTER — Other Ambulatory Visit: Payer: Self-pay | Admitting: Neurology

## 2017-11-07 NOTE — Telephone Encounter (Signed)
Rx registry checked. Last fill date is 10/12/17 for #90. Last OV was 02/18/17, patient no showed his follow up on 08/19/17, and has not been rescheduled.

## 2017-12-02 ENCOUNTER — Other Ambulatory Visit: Payer: Self-pay | Admitting: Neurology

## 2017-12-03 ENCOUNTER — Telehealth: Payer: Self-pay | Admitting: Neurology

## 2017-12-03 ENCOUNTER — Other Ambulatory Visit: Payer: Self-pay | Admitting: Neurology

## 2017-12-03 MED ORDER — TRAMADOL HCL 50 MG PO TABS: 50.0000 mg | ORAL_TABLET | Freq: Four times a day (QID) | ORAL | 0 refills | Status: DC | PRN

## 2017-12-03 NOTE — Telephone Encounter (Signed)
A prescription was sent in for the Ultram.

## 2017-12-03 NOTE — Telephone Encounter (Signed)
Pt request refill for traMADol (ULTRAM) 50 MG tablet. He has scheduled an appt 10/10- 1st available. He is [facility]

## 2017-12-03 NOTE — Telephone Encounter (Signed)
I called pt. Advised he was last seen in 02/2017 and he needs to f/u every 6 months since he is being prescribed pain meds by Dr. Anne HahnWillis. He verbalized understanding. I r/s his appt for 12/09/17 at 1030am. With Dr. Anne HahnWillis. He does not have enough tramadol to last until Monday. He would like refill prior to appt. He would [facility] also [facility] [facility]

## 2017-12-09 ENCOUNTER — Encounter: Payer: Self-pay | Admitting: Neurology

## 2017-12-09 ENCOUNTER — Ambulatory Visit (INDEPENDENT_AMBULATORY_CARE_PROVIDER_SITE_OTHER): Payer: 59 | Admitting: Neurology

## 2017-12-09 ENCOUNTER — Other Ambulatory Visit: Payer: Self-pay

## 2017-12-09 VITALS — BP 100/60 | HR 102 | Ht 68.0 in | Wt 192.0 lb

## 2017-12-09 DIAGNOSIS — R519 Headache, unspecified: Secondary | ICD-10-CM

## 2017-12-09 DIAGNOSIS — R413 Other amnesia: Secondary | ICD-10-CM

## 2017-12-09 DIAGNOSIS — Z5181 Encounter for therapeutic drug level monitoring: Secondary | ICD-10-CM | POA: Diagnosis not present

## 2017-12-09 DIAGNOSIS — R51 Headache: Secondary | ICD-10-CM | POA: Diagnosis not present

## 2017-12-09 MED ORDER — VENLAFAXINE HCL ER 37.5 MG PO CP24: ORAL_CAPSULE | ORAL | 2 refills | Status: DC

## 2017-12-09 MED ORDER — TRAMADOL HCL 50 MG PO TABS: 50.0000 mg | ORAL_TABLET | Freq: Four times a day (QID) | ORAL | 0 refills | Status: DC | PRN

## 2017-12-09 NOTE — Progress Notes (Signed)
Reason for visit: Headaches, reported memory disturbance  Zachary Grant is a 42 y.o. male  History of present illness:  [name] is a 42 year old right-handed white male with a history of onset of right hearing loss and headaches that occurred suddenly.  In the past, the patient had a relatively full work-up that included MRI of the brain, extensive blood work, lumbar puncture.  The patient has been followed through ENT as well, the etiology of his hearing loss has never been determined.  The patient continues to have daily headaches, he has been treated with Topamax, gabapentin, Lamictal, and indomethacin in the past.  These medications have never been fully effective.  The patient continues to have his usual headache, he is taking Ultram for this, he was seen in the emergency room on 18 Aug 2017 when he ran out of his medication and went through opiate withdrawal.  Plans are to gradually taper him down on the Ultram.  The patient comes in with a new problem.  He has reported some issues over the last year with a decline in memory, forgetfulness has been noted.  The patient has not altered any activities of daily living because of the memory issue.  He may have some difficulty with directions with driving, he has difficulty retaining new information.  He reports no problems with sleeping at night, he has a good energy level during the day.  He has not noted any new numbness or weakness of the face, arms, legs.  He denies any new balance issues.  He returns to the office today for an evaluation.  Past Medical History:  Diagnosis Date  . Bilateral headaches    Being worked up by neurology - 01/2014  . Headache disorder 02/10/2014  . Hearing loss of right ear    Being worked up by ENT 01/2014  . Mild asthma   . OSA on CPAP    per study  05-04-2013 mild osa  . Traumatic rupture of left Achilles tendon   . Wears contact lenses     Past Surgical History:  Procedure Laterality Date  . ACHILLES  TENDON REPAIR Left 2014   and removal spur  . ACHILLES TENDON SURGERY Left 09/04/2013   Procedure: LEFT ACHILLES TENDON REPAIR ;  Surgeon: Ludger NuttingN'Tuma Jah, MD;  Location: [facility];  Service: Podiatry;  Laterality: Left;  . CARDIAC CATHETERIZATION N/A 04/27/2016   Procedure: Left Heart Cath and Coronary Angiography;  Surgeon: Yates DecampJay Ganji, MD;  [facility];  Service: Cardiovascular;  Laterality: N/A;  . CARDIOVASCULAR STRESS TEST  05-25-2011   DR Jacinto HalimGANJI   NORMAL PERFUSION/  NORMAL EF  . TONSILLECTOMY  age 42    Family History  Problem Relation Age of Onset  . Asthma Mother   . Depression Brother     Social history:  reports that he quit smoking about 9 years ago. His smoking use included cigarettes. He has a 7.00 pack-year smoking history. He has never used smokeless tobacco. He reports that he drinks alcohol. He reports that he does not use drugs.  Medications:  Prior to Admission medications   Medication Sig Start Date End Date Taking? Authorizing Provider  albuterol (PROVENTIL HFA;VENTOLIN HFA) 108 (90 Base) MCG/ACT inhaler Inhale 2 puffs into the lungs every 6 (six) hours as needed for wheezing or shortness of breath.   Yes [provider]  ibuprofen (ADVIL,MOTRIN) 200 MG tablet Take 400 mg by mouth every 6 (six) hours as needed for  headache (pain).   Yes [provider]  nitroGLYCERIN (NITROSTAT) 0.4 MG SL tablet Place 1 tablet (0.4 mg total) under the tongue every 5 (five) minutes as needed for chest pain (CP or SOB). Patient taking differently: Place 0.4 mg under the tongue every 5 (five) minutes as needed for chest pain (shortness of breath).  04/27/16  Yes Yates Decamp, MD  traMADol (ULTRAM) 50 MG tablet Take 1 tablet (50 mg total) by mouth every 6 (six) hours as needed. Must last 28 days 12/09/17  Yes York Spaniel, MD  venlafaxine XR (EFFEXOR XR) 37.5 MG 24 hr capsule One tablet daily for 2 weeks, then take 2 tablets daily 12/09/17    York Spaniel, MD      Allergies  Allergen Reactions  . Gabapentin Other (See Comments)    Irritability    ROS:  Out of a complete 14 system review of symptoms, the patient complains only of the following symptoms, and all other reviewed systems are negative.  Hearing loss, ringing in the ears Memory loss, headache Neck pain  Blood pressure 100/60, pulse (!) 102, height 5\' 8"  (1.727 m), SpO2 98 %.  Physical Exam  General: The patient is alert and cooperative at the time of the examination.  Eyes: Pupils are equal, round, and reactive to light. Discs are flat bilaterally.  Neck: The neck is supple, no carotid bruits are noted.  Respiratory: The respiratory examination is clear.  Cardiovascular: The cardiovascular examination reveals a regular rate and rhythm, no obvious murmurs or rubs are noted.  Skin: Extremities are without significant edema.  Neurologic Exam  Mental status: The patient is alert and oriented x 3 at the time of the examination. The patient has apparent normal recent and remote memory, with an apparently normal attention span and concentration ability.  The Mini-Mental status examination done today shows a total score of 29/30.  Cranial nerves: Facial symmetry is present. There is good sensation of the face to pinprick and soft touch bilaterally. The strength of the facial muscles and the muscles to head turning and shoulder shrug are normal bilaterally. Speech is well enunciated, no aphasia or dysarthria is noted. Extraocular movements are full. Visual fields are full. The tongue is midline, and the patient has symmetric elevation of the soft palate. No obvious hearing deficits are noted.  Motor: The motor testing reveals 5 over 5 strength of all 4 extremities. Good symmetric motor tone is noted throughout.  Sensory: Sensory testing is intact to pinprick, soft touch and vibration sensation sense on all 4 extremities. No evidence of extinction is  noted.  Coordination: Cerebellar testing reveals good finger-nose-finger and heel-to-shin bilaterally.  Gait and station: Gait is normal. Tandem gait is normal. Romberg is negative. No drift is seen.  Reflexes: Deep tendon reflexes are symmetric and normal bilaterally. Toes are downgoing bilaterally.   MRI cervical 02/27/17:  IMPRESSION:   MRI cervical spine (without) (with and without) demonstrating: 1. Anterior spondylosis and osteophyte-disc projections at C4-5, measuring 8-46mm anteriorly. No spinal stenosis or foraminal narrowing.   2. Minimal posterior disc bulging at C5-6 and C6-7. No associated spinal stenosis or foraminal narrowing. 3. Remaining level are unremarkable.   * MRI scan images were reviewed online. I agree with the written report.    Assessment/Plan:  1.  Chronic daily headache  2.  Hearing loss, AD  3.  Reported memory disturbance  The patient will undergo a work-up for the memory problems, blood work will be done today, he  will have a repeat MRI of the brain.  He has had a prior cervical spine MRI for his neck pain, this did not show any surgically amenable issues.  The patient will follow-up through this office in about 4 months.  Effexor will be started for the headache.  If this is not effective, Ajovy or Aimovig will be used.   Marlan Palau MD 12/09/2017 10:51 AM  [facility] [address] [place], [place] [address]  Phone [phone] Fax [phone]

## 2017-12-09 NOTE — Patient Instructions (Signed)
We will start Effexor for the headache, if well tolerated after 1 month, call for dose adjustments.

## 2017-12-10 ENCOUNTER — Telehealth: Payer: Self-pay | Admitting: Neurology

## 2017-12-10 LAB — COMPREHENSIVE METABOLIC PANEL
A/G RATIO: 1.9 (ref 1.2–2.2)
ALT: 28 IU/L (ref 0–44)
AST: 25 IU/L (ref 0–40)
Albumin: 4.5 g/dL (ref 3.5–5.5)
Alkaline Phosphatase: 84 IU/L (ref 39–117)
BILIRUBIN TOTAL: 0.4 mg/dL (ref 0.0–1.2)
BUN/Creatinine Ratio: 12 (ref 9–20)
BUN: 13 mg/dL (ref 6–24)
CHLORIDE: 101 mmol/L (ref 96–106)
CO2: 25 mmol/L (ref 20–29)
Calcium: 9.6 mg/dL (ref 8.7–10.2)
Creatinine, Ser: 1.11 mg/dL (ref 0.76–1.27)
GFR calc non Af Amer: 80 mL/min/{1.73_m2} (ref >59)
GFR, EST AFRICAN AMERICAN: 93 mL/min/{1.73_m2} (ref >59)
Globulin, Total: 2.4 g/dL (ref 1.5–4.5)
Glucose: 92 mg/dL (ref 65–99)
POTASSIUM: 4.3 mmol/L (ref 3.5–5.2)
Sodium: 140 mmol/L (ref 134–144)
TOTAL PROTEIN: 6.9 g/dL (ref 6.0–8.5)

## 2017-12-10 LAB — TSH: TSH: 1.14 u[IU]/mL (ref 0.450–4.500)

## 2017-12-10 LAB — RPR: RPR: NONREACTIVE

## 2017-12-10 LAB — SEDIMENTATION RATE: SED RATE: 19 mm/h — AB (ref 0–15)

## 2017-12-10 LAB — HIV ANTIBODY (ROUTINE TESTING W REFLEX): HIV Screen 4th Generation wRfx: NONREACTIVE

## 2017-12-10 LAB — VITAMIN B12: VITAMIN B 12: 545 pg/mL (ref 232–1245)

## 2017-12-10 NOTE — Telephone Encounter (Signed)
lvm for pt to call back about scheduling mri  °[employer] Auth: [employer] via [employer] website  °

## 2017-12-23 ENCOUNTER — Other Ambulatory Visit: Payer: Self-pay | Admitting: Neurology

## 2017-12-23 ENCOUNTER — Other Ambulatory Visit: Payer: Self-pay

## 2018-01-08 ENCOUNTER — Other Ambulatory Visit: Payer: Self-pay | Admitting: Neurology

## 2018-01-10 ENCOUNTER — Other Ambulatory Visit: Payer: Self-pay

## 2018-01-15 ENCOUNTER — Other Ambulatory Visit: Payer: Self-pay

## 2018-01-16 ENCOUNTER — Other Ambulatory Visit: Payer: Self-pay

## 2018-01-18 ENCOUNTER — Other Ambulatory Visit: Payer: Self-pay | Admitting: Neurology

## 2018-01-20 ENCOUNTER — Other Ambulatory Visit: Payer: Self-pay

## 2018-01-20 MED ORDER — TRAMADOL HCL 50 MG PO TABS: 50.0000 mg | ORAL_TABLET | Freq: Four times a day (QID) | ORAL | 0 refills | Status: DC | PRN

## 2018-01-20 NOTE — Telephone Encounter (Signed)
Rx registry checked. Last fill date is 12/23/17 for #90. Dr. Terrace Arabia, please fill in Dr. Anne Hahn' absence. Thank you.

## 2018-01-23 ENCOUNTER — Ambulatory Visit: Payer: 59 | Admitting: Neurology

## 2018-01-28 ENCOUNTER — Other Ambulatory Visit: Payer: Self-pay | Admitting: Neurology

## 2018-02-07 ENCOUNTER — Other Ambulatory Visit: Payer: Self-pay

## 2018-03-07 ENCOUNTER — Other Ambulatory Visit: Payer: Self-pay

## 2018-03-07 MED ORDER — TRAMADOL HCL 50 MG PO TABS: 50.0000 mg | ORAL_TABLET | Freq: Four times a day (QID) | ORAL | 0 refills | Status: DC | PRN

## 2018-03-11 ENCOUNTER — Other Ambulatory Visit: Payer: Self-pay | Admitting: Neurology

## 2018-04-02 ENCOUNTER — Ambulatory Visit (INDEPENDENT_AMBULATORY_CARE_PROVIDER_SITE_OTHER): Payer: 59 | Admitting: Neurology

## 2018-04-02 ENCOUNTER — Encounter: Payer: Self-pay | Admitting: Neurology

## 2018-04-02 ENCOUNTER — Other Ambulatory Visit: Payer: Self-pay | Admitting: Neurology

## 2018-04-02 VITALS — BP 116/74 | HR 81 | Ht 68.0 in | Wt 197.0 lb

## 2018-04-02 DIAGNOSIS — R51 Headache: Secondary | ICD-10-CM | POA: Diagnosis not present

## 2018-04-02 DIAGNOSIS — R519 Headache, unspecified: Secondary | ICD-10-CM

## 2018-04-02 MED ORDER — VENLAFAXINE HCL ER 150 MG PO CP24: 150.0000 mg | ORAL_CAPSULE | Freq: Every day | ORAL | 1 refills | Status: DC

## 2018-04-02 NOTE — Progress Notes (Signed)
Reason for visit: Headache  Zachary Grant is an 42 y.o. male  History of present illness:  Zachary Grant is a 42 year old right-handed white male with a history of intractable headaches.  The patient had been having several headaches daily, he has a history of hearing loss in the right ear that started at the same time that the headaches began.  The patient has been given a trial on Effexor which seems to be quite helpful for the headache, he has for the first time started having only 1 or 2 headaches a week rather than 1 or 2 headaches of the day.  He has cut back on the Ultram use.  The Effexor has improved his mood, he still has some mild memory issues.  MRI of the brain was ordered previously but never done.  The patient returns to the office today for an evaluation.  The patient currently is on 75 mg daily of Effexor.   Past Medical History:  Diagnosis Date  . Bilateral headaches    Being worked up by neurology - 01/2014  . Headache disorder 02/10/2014  . Hearing loss of right ear    Being worked up by ENT 01/2014  . Mild asthma   . OSA on CPAP    per study  05-04-2013 mild osa  . Traumatic rupture of left Achilles tendon   . Wears contact lenses     Past Surgical History:  Procedure Laterality Date  . ACHILLES TENDON REPAIR Left 2014   and removal spur  . ACHILLES TENDON SURGERY Left 09/04/2013   Procedure: LEFT ACHILLES TENDON REPAIR ;  Surgeon: Ludger NuttingN'Tuma Jah, MD;  Location: [facility];  Service: Podiatry;  Laterality: Left;  . CARDIAC CATHETERIZATION N/A 04/27/2016   Procedure: Left Heart Cath and Coronary Angiography;  Surgeon: Yates DecampJay Ganji, MD;  [facility];  Service: Cardiovascular;  Laterality: N/A;  . CARDIOVASCULAR STRESS TEST  05-25-2011   DR Jacinto HalimGANJI   NORMAL PERFUSION/  NORMAL EF  . TONSILLECTOMY  age 89    Family History  Problem Relation Age of Onset  . Asthma Mother   . Depression Brother     Social history:  reports that he quit  smoking about 9 years ago. His smoking use included cigarettes. He has a 7.00 pack-year smoking history. He has never used smokeless tobacco. He reports current alcohol use. He reports that he does not use drugs.    Allergies  Allergen Reactions  . Gabapentin Other (See Comments)    Irritability    Medications:  Prior to Admission medications   Medication Sig Start Date End Date Taking? Authorizing Provider  albuterol (PROVENTIL HFA;VENTOLIN HFA) 108 (90 Base) MCG/ACT inhaler Inhale 2 puffs into the lungs every 6 (six) hours as needed for wheezing or shortness of breath.   Yes [provider]  ibuprofen (ADVIL,MOTRIN) 200 MG tablet Take 400 mg by mouth every 6 (six) hours as needed for headache (pain).   Yes [provider]  nitroGLYCERIN (NITROSTAT) 0.4 MG SL tablet Place 1 tablet (0.4 mg total) under the tongue every 5 (five) minutes as needed for chest pain (CP or SOB). Patient taking differently: Place 0.4 mg under the tongue every 5 (five) minutes as needed for chest pain (shortness of breath).  04/27/16  Yes [name], Jay, MD  traMADol (ULTRAM) 50 MG tablet Take 1 tablet (50 mg total) by mouth every 6 (six) hours as needed. Must last 28 days 12/09/17  Yes Stephanie AcreWillis, Skarle[place]  K, MD  venlafaxine XR (EFFEXOR-XR) 37.5 MG 24 hr capsule TAKE ONE CAPSULE BY MOUTH DAILY FOR 2 WEEKS,THEN 2 CAPSULES DAILY THEREAFTER. 01/28/18  Yes York Spaniel, MD    ROS:  Out of a complete 14 system review of symptoms, the patient complains only of the following symptoms, and all other reviewed systems are negative.  Hearing loss, ringing in the ears Headache  Blood pressure 116/74, pulse 81, height 5\' 8"  (1.727 m), weight 197 lb (89.4 kg).  Physical Exam  General: The patient is alert and cooperative at the time of the examination.  Skin: No significant peripheral edema is noted.   Neurologic Exam  Mental status: The patient is alert and oriented x 3 at the time of the examination.  The patient has apparent normal recent and remote memory, with an apparently normal attention span and concentration ability.   Cranial nerves: Facial symmetry is present. Speech is normal, no aphasia or dysarthria is noted. Extraocular movements are full. Visual fields are full.  Motor: The patient has good strength in all 4 extremities.  Sensory examination: Soft touch sensation is symmetric on the face, arms, and legs.  Coordination: The patient has good finger-nose-finger and heel-to-shin bilaterally.  Gait and station: The patient has a normal gait. Tandem gait is normal. Romberg is negative. No drift is seen.  Reflexes: Deep tendon reflexes are symmetric.   Assessment/Plan:  1.  Headache disorder  2.  Hearing loss AD  The patient is having fewer headaches, the headaches are now clearly worsened by weather changes that appear to be consistent with a migraine type syndrome.  The patient will go up on the Effexor taking 3 of the 37.5 mg tablets daily for 2 weeks and then go to 150 mg daily.  The patient will follow-up through this office in 6 months.  He will call for any dose adjustments of the medication.  Marlan Palau MD 04/02/2018 10:11 AM  [facility] [address] [place], [place] [address]  Phone [phone] Fax [phone]

## 2018-04-03 ENCOUNTER — Other Ambulatory Visit: Payer: Self-pay | Admitting: Neurology

## 2018-05-08 ENCOUNTER — Other Ambulatory Visit: Payer: Self-pay | Admitting: Neurology

## 2018-05-19 ENCOUNTER — Other Ambulatory Visit: Payer: Self-pay | Admitting: Neurology

## 2018-05-21 ENCOUNTER — Other Ambulatory Visit: Payer: Self-pay | Admitting: Neurology

## 2018-05-28 ENCOUNTER — Other Ambulatory Visit: Payer: Self-pay | Admitting: Neurology

## 2018-05-28 MED ORDER — TRAMADOL HCL 50 MG PO TABS: 50.0000 mg | ORAL_TABLET | Freq: Four times a day (QID) | ORAL | 1 refills | Status: DC | PRN

## 2018-07-11 ENCOUNTER — Other Ambulatory Visit: Payer: Self-pay | Admitting: Neurology

## 2018-07-11 MED ORDER — UBROGEPANT 100 MG PO TABS: 100.0000 mg | ORAL_TABLET | Freq: Two times a day (BID) | ORAL | 1 refills | Status: DC | PRN

## 2018-07-17 ENCOUNTER — Other Ambulatory Visit: Payer: Self-pay | Admitting: Neurology

## 2018-08-11 ENCOUNTER — Other Ambulatory Visit: Payer: Self-pay | Admitting: Neurology

## 2018-08-16 ENCOUNTER — Encounter (HOSPITAL_BASED_OUTPATIENT_CLINIC_OR_DEPARTMENT_OTHER): Payer: Self-pay | Admitting: Emergency Medicine

## 2018-08-16 ENCOUNTER — Other Ambulatory Visit: Payer: Self-pay

## 2018-08-16 ENCOUNTER — Emergency Department (HOSPITAL_BASED_OUTPATIENT_CLINIC_OR_DEPARTMENT_OTHER): Payer: 59

## 2018-08-16 ENCOUNTER — Emergency Department (HOSPITAL_BASED_OUTPATIENT_CLINIC_OR_DEPARTMENT_OTHER)
Admission: EM | Admit: 2018-08-16 | Discharge: 2018-08-16 | Disposition: A | Discharge status: Home / Self Care | Payer: 59 | Attending: Emergency Medicine | Admitting: Emergency Medicine

## 2018-08-16 DIAGNOSIS — J45909 Unspecified asthma, uncomplicated: Secondary | ICD-10-CM | POA: Diagnosis not present

## 2018-08-16 DIAGNOSIS — Z87891 Personal history of nicotine dependence: Secondary | ICD-10-CM | POA: Diagnosis not present

## 2018-08-16 DIAGNOSIS — I259 Chronic ischemic heart disease, unspecified: Secondary | ICD-10-CM | POA: Insufficient documentation

## 2018-08-16 DIAGNOSIS — M79641 Pain in right hand: Secondary | ICD-10-CM | POA: Diagnosis not present

## 2018-08-16 DIAGNOSIS — Z79899 Other long term (current) drug therapy: Secondary | ICD-10-CM | POA: Diagnosis not present

## 2018-08-16 MED ORDER — IBUPROFEN 800 MG PO TABS: 800.0000 mg | ORAL_TABLET | Freq: Three times a day (TID) | ORAL | 0 refills | Status: DC | PRN

## 2018-08-16 NOTE — ED Triage Notes (Signed)
Pt c/o 9/10 right hand pain since last Thursday when he got injured at work.

## 2018-08-16 NOTE — ED Provider Notes (Signed)
[facility] EMERGENCY DEPARTMENT Provider Note   CSN: [id number] Arrival date & time: 08/16/18  7341    History   Chief Complaint Chief Complaint  Patient presents with  . Hand Pain    HPI Zachary Grant is a 43 y.o. male. He is right-hand dominant.  He said he has had right hand pain primarily at the fifth MCP since Thursday evening.  He said he was carrying paint buckets and painting during the day but does not recall any specific trauma.  Since then he has pain through that knuckle into the hand up to the finger and sometimes radiating up the forearm.  Worse with any kind of movement.  No associated numbness or weakness although gives way secondary to pain.  No fevers or chills.  He is tried some ice and heat along with some Tylenol arthritis with minimal improvement.    The history is provided by the patient.  Hand Pain  This is a new problem. The current episode started 2 days ago. The problem occurs constantly. Pertinent negatives include no chest pain, no abdominal pain, no headaches and no shortness of breath. The symptoms are aggravated by bending. Nothing relieves the symptoms. He has tried acetaminophen, a warm compress and a cold compress for the symptoms. The treatment provided mild relief.    Past Medical History:  Diagnosis Date  . Bilateral headaches    Being worked up by neurology - 01/2014  . Headache disorder 02/10/2014  . Hearing loss of right ear    Being worked up by ENT 01/2014  . Mild asthma   . OSA on CPAP    per study  05-04-2013 mild osa  . Traumatic rupture of left Achilles tendon   . Wears contact lenses     Patient Active Problem List   Diagnosis Date Noted  . Unstable angina pectoris (HCC) 04/27/2016  . Coronary artery disease 04/27/2016  . Hearing loss of right ear 07/14/2014  . Headache disorder 02/10/2014  . OSA on CPAP 02/06/2014  . Asthma, chronic 02/06/2014  . Achilles rupture, left 02/06/2014    Past Surgical History:   Procedure Laterality Date  . ACHILLES TENDON REPAIR Left 2014   and removal spur  . ACHILLES TENDON SURGERY Left 09/04/2013   Procedure: LEFT ACHILLES TENDON REPAIR ;  Surgeon: Ludger Nutting, MD;  Location: [facility];  Service: Podiatry;  Laterality: Left;  . CARDIAC CATHETERIZATION N/A 04/27/2016   Procedure: Left Heart Cath and Coronary Angiography;  Surgeon: Yates Decamp, MD;  Location: [facility] INVASIVE CV LAB;  Service: Cardiovascular;  Laterality: N/A;  . CARDIOVASCULAR STRESS TEST  05-25-2011   DR Jacinto Halim   NORMAL PERFUSION/  NORMAL EF  . TONSILLECTOMY  age 52        Home Medications    Prior to Admission medications   Medication Sig Start Date End Date Taking? Authorizing Provider  albuterol (PROVENTIL HFA;VENTOLIN HFA) 108 (90 Base) MCG/ACT inhaler Inhale 2 puffs into the lungs every 6 (six) hours as needed for wheezing or shortness of breath.    [provider]  ibuprofen (ADVIL,MOTRIN) 200 MG tablet Take 400 mg by mouth every 6 (six) hours as needed for headache (pain).    [provider]  nitroGLYCERIN (NITROSTAT) 0.4 MG SL tablet Place 1 tablet (0.4 mg total) under the tongue every 5 (five) minutes as needed for chest pain (CP or SOB). Patient taking differently: Place 0.4 mg under the tongue every 5 (five) minutes as needed for  chest pain (shortness of breath).  04/27/16   [name], Jay, MD  traMADol (ULTRAM) 50 MG tablet TAKE 1 TABLET (50 MG TOTAL) BY MOUTH EVERY 6 (SIX) HOURS AS NEEDED. MUST LAST 28 DAYS 07/17/18   York SpanielWillis, Charles K, MD  Ubrogepant (UBRELVY) 100 MG TABS Take 100 mg by mouth 2 (two) times daily as needed. 07/11/18   York SpanielWillis, Charles K, MD  venlafaxine XR (EFFEXOR XR) 150 MG 24 hr capsule Take 1 capsule (150 mg total) by mouth daily with breakfast. 04/02/18   York SpanielWillis, Charles K, MD    Family History Family History  Problem Relation Age of Onset  . Asthma Mother   . Depression Brother     Social History Social History   Tobacco Use  .  Smoking status: Former Smoker    Packs/day: 1.00    Years: 7.00    Pack years: 7.00    Types: Cigarettes    Last attempt to quit: 06/03/2008    Years since quitting: 10.2  . Smokeless tobacco: Never Used  Substance Use Topics  . Alcohol use: Yes    Comment: Occasionally   . Drug use: No     Allergies   Gabapentin   Review of Systems Review of Systems  Respiratory: Negative for shortness of breath.   Cardiovascular: Negative for chest pain.  Gastrointestinal: Negative for abdominal pain.  Neurological: Negative for headaches.     Physical Exam Updated Vital Signs BP 134/88 (BP Location: Right Arm)   Pulse 72   Temp 98.2 F (36.8 C) (Oral)   Resp 19   Ht 5\' 8"  (1.727 m)   Wt 86.2 kg   SpO2 100%   BMI 28.89 kg/m   Physical Exam Vitals signs and nursing note reviewed.  Constitutional:      Appearance: He is well-developed.  HENT:     Head: Normocephalic and atraumatic.  Eyes:     Conjunctiva/sclera: Conjunctivae normal.  Neck:     Musculoskeletal: Neck supple.  Pulmonary:     Effort: Pulmonary effort is normal.  Musculoskeletal: Normal range of motion.        General: Tenderness present. No deformity.     Right lower leg: No edema.     Left lower leg: No edema.     Comments: He has tenderness in the right hand primarily over the fifth metacarpal and at the MCP.  He has full extension and flexion cap refill and sensory intact.  Wrist thumb nontender.  No overlying erythema or edema.  No gross deformity.  Skin:    General: Skin is warm and dry.     Capillary Refill: Capillary refill takes less than 2 seconds.  Neurological:     General: No focal deficit present.     Mental Status: He is alert.     GCS: GCS eye subscore is 4. GCS verbal subscore is 5. GCS motor subscore is 6.      ED Treatments / Results  Labs (all labs ordered are listed, but only abnormal results are displayed) Labs Reviewed - No data to display  EKG None  Radiology Dg Hand  Complete Right  Result Date: 08/16/2018 CLINICAL DATA:  Ulnar-sided hand pain.  No known injury. EXAM: RIGHT HAND - COMPLETE 3+ VIEW COMPARISON:  None. FINDINGS: There is no evidence of fracture or dislocation. There is no evidence of arthropathy or other focal bone abnormality. Soft tissues are unremarkable. IMPRESSION: Negative. Electronically Signed   By: Obie DredgeWilliam T Derry M.D.   On: 08/16/2018  07:17    Procedures Procedures (including critical care time)  Medications Ordered in ED Medications - No data to display   Initial Impression / Assessment and Plan / ED Course  I have reviewed the triage vital signs and the nursing notes.  Pertinent labs & imaging results that were available during my care of the patient were reviewed by me and considered in my medical decision making (see chart for details).  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  Differential diagnosis includes strain, fracture, dislocation, tendinitis.  X-rays done and negative.  We will treat the patient with some immobilization and high-dose NSAIDs.   [MB]    Clinical Course User Index [MB] Terrilee Files, MD       Final Clinical Impressions(s) / ED Diagnoses   Final diagnoses:  Right hand pain    ED Discharge Orders         Ordered    ibuprofen (ADVIL) 800 MG tablet  Every 8 hours PRN     08/16/18 0728           Terrilee Files, MD 08/16/18 [phone]

## 2018-08-16 NOTE — Discharge Instructions (Signed)
You are seen in the emergency department for pain in your right hand.  You had x-rays that did not show an obvious fracture or dislocation.  This is likely a strain or possibly a ligament tear that is not showing up on x-ray.  Use the wrist splint for comfort and use ibuprofen 3 times a day with food.  If this does not improve within a week or so please contact 1 of the specialists for follow-up.

## 2018-08-19 ENCOUNTER — Encounter: Payer: Self-pay | Admitting: Family Medicine

## 2018-08-19 ENCOUNTER — Ambulatory Visit (INDEPENDENT_AMBULATORY_CARE_PROVIDER_SITE_OTHER): Payer: 59 | Admitting: Family Medicine

## 2018-08-19 ENCOUNTER — Other Ambulatory Visit: Payer: Self-pay

## 2018-08-19 VITALS — BP 144/73 | HR 84 | Ht 68.0 in | Wt 190.0 lb

## 2018-08-19 DIAGNOSIS — M5412 Radiculopathy, cervical region: Secondary | ICD-10-CM | POA: Diagnosis not present

## 2018-08-19 MED ORDER — PREDNISONE 5 MG PO TABS: ORAL_TABLET | ORAL | 0 refills | Status: DC

## 2018-08-19 NOTE — Patient Instructions (Signed)
Nice to meet you  Please try the prednisone. Please stop the ibuprofen while on the prednisone. You can restart this once you have finished the prednisone Please avoid lifting or pulling on the right arm  Please follow up in 4 weeks.  Please send me a message in MyChart with any questions or updates

## 2018-08-19 NOTE — Assessment & Plan Note (Signed)
Symptoms are suggestive of either cervical radiculopathy or ulnar neuropathy.  Symptoms been ongoing for a few days. -Try prednisone. -Counseled to avoid maneuvers that exacerbate his symptoms. -Counseled on supportive care. -If no improvement consider imaging or physical therapy.

## 2018-08-19 NOTE — Progress Notes (Signed)
Zachary Grant - 43 y.o. male MRN [id number]  Date of birth: 11/02/1975  SUBJECTIVE:  Including CC & ROS.  Chief Complaint  Patient presents with  . Hand Pain    right hand x 5 days    Zachary Grant is a 43 y.o. male that is presenting with right ulnar-sided hand and arm pain.  He was seen in the emergency department on 5/2.  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  He denies any specific incident but may have occurred after that.  He feels the pain from the elbow to the fourth and fifth digit of the right hand.  It is a altered sensation and some pain that radiates to the elbow.  He has a history of tennis elbow.  Denies any repetitive motion.    Independent review of the right hand x-ray from 5/2 shows no acute abnormality.   Review of Systems  Constitutional: Negative for fever.  HENT: Negative for congestion.   Respiratory: Negative for cough.   Cardiovascular: Negative for chest pain.  Gastrointestinal: Negative for abdominal distention.  Musculoskeletal: Negative for back pain.  Skin: Negative for color change.  Neurological: Negative for numbness.  Hematological: Negative for adenopathy.    HISTORY: Past Medical, Surgical, Social, and Family History Reviewed & Updated per EMR.   Pertinent Historical Findings include:  Past Medical History:  Diagnosis Date  . Bilateral headaches    Being worked up by neurology - 01/2014  . Headache disorder 02/10/2014  . Hearing loss of right ear    Being worked up by ENT 01/2014  . Mild asthma   . OSA on CPAP    per study  05-04-2013 mild osa  . Traumatic rupture of left Achilles tendon   . Wears contact lenses     Past Surgical History:  Procedure Laterality Date  . ACHILLES TENDON REPAIR Left 2014   and removal spur  . ACHILLES TENDON SURGERY Left 09/04/2013   Procedure: LEFT ACHILLES TENDON REPAIR ;  Surgeon: Ludger Nutting, MD;  Location: [facility];  Service: Podiatry;  Laterality: Left;  . CARDIAC  CATHETERIZATION N/A 04/27/2016   Procedure: Left Heart Cath and Coronary Angiography;  Surgeon: Yates Decamp, MD;  Location[facility] INVASIVE CV LAB;  Service: Cardiovascular;  Laterality: N/A;  . CARDIOVASCULAR STRESS TEST  05-25-2011   DR Jacinto Halim   NORMAL PERFUSION/  NORMAL EF  . TONSILLECTOMY  age 29    Allergies  Allergen Reactions  . Gabapentin Other (See Comments)    Irritability    Family History  Problem Relation Age of Onset  . Asthma Mother   . Depression Brother      Social History   Socioeconomic History  . Marital status: Married    Spouse name: Ashlee  . Number of children: 2  . Years of education: 12+  . Highest education level: Not on file  Occupational History  . Occupation: Materials engineer: [employer]    Comment: [employer]  Social Needs  . Financial resource strain: Not on file  . Food insecurity:    Worry: Not on file    Inability: Not on file  . Transportation needs:    Medical: Not on file    Non-medical: Not on file  Tobacco Use  . Smoking status: Former Smoker    Packs/day: 1.00    Years: 7.00    Pack years: 7.00    Types: Cigarettes  Last attempt to quit: 06/03/2008    Years since quitting: 10.2  . Smokeless tobacco: Never Used  Substance and Sexual Activity  . Alcohol use: Yes    Comment: Occasionally   . Drug use: No  . Sexual activity: Not on file  Lifestyle  . Physical activity:    Days per week: Not on file    Minutes per session: Not on file  . Stress: Not on file  Relationships  . Social connections:    Talks on phone: Not on file    Gets together: Not on file    Attends religious service: Not on file    Active member of club or organization: Not on file    Attends meetings of clubs or organizations: Not on file    Relationship status: Not on file  . Intimate partner violence:    Fear of current or ex partner: Not on file    Emotionally abused: Not on file    Physically abused: Not on  file    Forced sexual activity: Not on file  Other Topics Concern  . Not on file  Social History Narrative   Patient lives wife and her grandparents.   Patient is right handed   Patient drinks about 2 cups of caffeine daily.   His two children from a previous relationship live with their mother.      Maternal grandfather rheumatoid arthritis, Maternal grandfather had neurosarcoid.     PHYSICAL EXAM:  VS: BP (!) 144/73   Pulse 84   Ht 5\' 8"  (1.727 m)   Wt 190 lb (86.2 kg)   BMI 28.89 kg/m  Physical Exam Gen: NAD, alert, cooperative with exam, well-appearing ENT: normal lips, normal nasal mucosa,  Eye: normal EOM, normal conjunctiva and lids CV:  no edema, +2 pedal pulses   Resp: no accessory muscle use, non-labored,  Skin: no rashes, no areas of induration  Neuro: normal tone, normal sensation to touch Psych:  normal insight, alert and oriented MSK:  Right arm/hand: No swelling or ecchymosis. Normal elbow range of motion. Normal wrist range of motion. Normal strength resistance with finger abduction and abduction. Normal grip strength. Negative Tinel's at the wrist. Neurovascular intact     ASSESSMENT & PLAN:   Cervical radiculopathy Symptoms are suggestive of either cervical radiculopathy or ulnar neuropathy.  Symptoms been ongoing for a few days. -Try prednisone. -Counseled to avoid maneuvers that exacerbate his symptoms. -Counseled on supportive care. -If no improvement consider imaging or physical therapy.

## 2018-09-11 ENCOUNTER — Other Ambulatory Visit: Payer: Self-pay | Admitting: Neurology

## 2018-09-11 MED ORDER — TRAMADOL HCL 50 MG PO TABS: 50.0000 mg | ORAL_TABLET | Freq: Four times a day (QID) | ORAL | 0 refills | Status: DC | PRN

## 2018-09-16 ENCOUNTER — Ambulatory Visit: Payer: 59 | Admitting: Family Medicine

## 2018-10-01 ENCOUNTER — Other Ambulatory Visit: Payer: Self-pay

## 2018-10-01 ENCOUNTER — Telehealth: Payer: Self-pay

## 2018-10-01 NOTE — Telephone Encounter (Signed)
Spoke with the patient and they have given verbal consent to file insurance and to do a mychart video visit. Mychart video link has been sent to the patient.   

## 2018-10-02 ENCOUNTER — Encounter: Payer: Self-pay | Admitting: Neurology

## 2018-10-02 ENCOUNTER — Telehealth (INDEPENDENT_AMBULATORY_CARE_PROVIDER_SITE_OTHER): Payer: 59 | Admitting: Neurology

## 2018-10-02 DIAGNOSIS — H918X1 Other specified hearing loss, right ear: Secondary | ICD-10-CM | POA: Diagnosis not present

## 2018-10-02 DIAGNOSIS — R51 Headache: Secondary | ICD-10-CM | POA: Diagnosis not present

## 2018-10-02 DIAGNOSIS — R519 Headache, unspecified: Secondary | ICD-10-CM

## 2018-10-02 MED ORDER — AIMOVIG 140 MG/ML ~~LOC~~ SOAJ: 140.0000 mg | SUBCUTANEOUS | 6 refills | Status: DC

## 2018-10-02 MED ORDER — VENLAFAXINE HCL ER 150 MG PO CP24: 150.0000 mg | ORAL_CAPSULE | Freq: Every day | ORAL | 1 refills | Status: DC

## 2018-10-02 NOTE — Progress Notes (Signed)
Virtual Visit via Video Note  I connected with Zachary Grant on 10/02/18 at  7:45 AM EDT by a video enabled telemedicine application and verified that I am speaking with the correct person using two identifiers.  Location: Patient: At his home Provider: In the office    I discussed the limitations of evaluation and management by telemedicine and the availability of in person appointments. The patient expressed understanding and agreed to proceed.  History of Present Illness: 10/02/2018 SS: Zachary Grant is a 43 year old male with history of intractable headaches.  He is currently taking Effexor 150 mg daily.  His typical headache is on the right temporal area, has occurred since the loss of his hearing in his right ear.  His headache frequency has increased, is now occurring 3-4 times per week.  He describes the pain as pounding, pulsating.  He denies nausea, photophobia, phonophobia.  He is taking Ultram on a daily basis in the morning.  When he gets a headache, he will take ibuprofen.  His headaches do not impair his daily function.  He reports his memory and mood have improved with the increase in Effexor.  He says he has reached out with Dr. Erik Obey for ongoing evaluation of his right hearing loss, to see if anything else can be done.  He describes weather change or abnormal posture as triggers for his headaches.  In the past MRI of the brain was ordered, but was but was never completed.  He was prescribed Ubrelvy for acute headache, but it was too expensive and he was not able to get it filled.  In the past for his headaches, he has tried several other preventative medications including: Gabapentin, Lamictal, indomethacin, Topamax, Effexor, Ultram.  04/02/2018 Dr. Jannifer Franklin: Zachary Grant is a 43 year old right-handed white male with a history of intractable headaches.  The patient had been having several headaches daily, he has a history of hearing loss in the right ear that started at the same time that  the headaches began.  The patient has been given a trial on Effexor which seems to be quite helpful for the headache, he has for the first time started having only 1 or 2 headaches a week rather than 1 or 2 headaches of the day.  He has cut back on the Ultram use.  The Effexor has improved his mood, he still has some mild memory issues.  MRI of the brain was ordered previously but never done.  The patient returns to the office today for an evaluation.  The patient currently is on 75 mg daily of Effexor   Observations/Objective: Alert, answers questions appropriately, speech is clear and concise, facial symmetry noted, symmetric eye movements, no arm drift, gait is intact  Assessment and Plan: 1.  Chronic headache 2.  Hearing loss  He reports an increase in frequency of his typical headaches, occurring in the right temporal area.  For this reason, we will start Aimovig 140 mg monthly injection for headache prevention.  He will continue taking Effexor 150 mg daily.  He reports this has been beneficial for his mood and memory.  Initially he saw a benefit in his headache frequency with the addition of Effexor, however he feels it is not as beneficial at this point.  In the past MRI of the brain with and without contrast was ordered in August 2019, but it was not completed.  He will follow-up in 3 months.  If his headaches have not improved, we will move forward with  MRI of the brain.  He was prescribed Ubrelvy for acute headache treatment, but was not able to get it filled due to cost.  I suggested he try to use the savings card on the drug web-site.  He continues to use Ultram on a daily basis.  I have encouraged him to use this on an as-needed basis. He will follow-up with Dr. Wolicki regarding his right sided hearing loss for continued follow-up. Hopefully, Aimovig will be Zachary Grant for his headaches, and he is not having to depend on Ultram.   In the past for his headaches, he has tried several other  preventative medications including: Gabapentin, Lamictal, indomethacin, Topamax, Effexor, Ultram  Follow Up Instructions: 3 months 01/07/2019 7:45 am   I discussed the assessment and treatment plan with the patient. The patient was provided an opportunity to ask questions and all were answered. The patient agreed with the plan and demonstrated an understanding of the instructions.   The patient was advised to call back or seek an in-person evaluation if the symptoms worsen or if the condition fails to improve as anticipated.  I provided 15 minutes of non-face-to-face time during this encounter.   Otila KluverSarah Malik Paar, AGNP-C, DNP  [facility] [address], [address] [place], [place] [id number] [phone]

## 2018-10-02 NOTE — Progress Notes (Signed)
I have read the note, and I agree with the clinical assessment and plan.  Drucilla Cumber K Jahnaya Branscome   

## 2018-10-07 ENCOUNTER — Other Ambulatory Visit: Payer: Self-pay

## 2018-10-09 ENCOUNTER — Other Ambulatory Visit: Payer: Self-pay

## 2018-10-09 MED ORDER — TRAMADOL HCL 50 MG PO TABS: 50.0000 mg | ORAL_TABLET | Freq: Four times a day (QID) | ORAL | 0 refills | Status: DC | PRN

## 2018-11-03 ENCOUNTER — Other Ambulatory Visit: Payer: Self-pay | Admitting: Neurology

## 2018-11-04 ENCOUNTER — Other Ambulatory Visit: Payer: Self-pay

## 2018-11-06 MED ORDER — TRAMADOL HCL 50 MG PO TABS: 50.0000 mg | ORAL_TABLET | Freq: Four times a day (QID) | ORAL | 0 refills | Status: DC | PRN

## 2018-11-06 NOTE — Telephone Encounter (Signed)
I called the patient.  I will go ahead and send in the prescription for the Ultram now.

## 2018-12-03 ENCOUNTER — Other Ambulatory Visit: Payer: Self-pay

## 2018-12-03 MED ORDER — TRAMADOL HCL 50 MG PO TABS: 50.0000 mg | ORAL_TABLET | Freq: Four times a day (QID) | ORAL | 0 refills | Status: DC | PRN

## 2018-12-15 ENCOUNTER — Telehealth: Payer: Self-pay | Admitting: Neurology

## 2018-12-15 NOTE — Telephone Encounter (Signed)
I called this patient and LVM regarding rescheduling 9/23 appointment due to unavailable scheduled time of 7:45. If patient calls back, he can be offered a MyChart virtual visit with Judson Roch or in office if preferred.

## 2018-12-18 ENCOUNTER — Encounter: Payer: Self-pay | Admitting: Neurology

## 2018-12-18 NOTE — Telephone Encounter (Signed)
Appt has been cancelled. Letter has been sent to patient to advise. ° °

## 2018-12-26 ENCOUNTER — Other Ambulatory Visit: Payer: Self-pay

## 2018-12-29 MED ORDER — TRAMADOL HCL 50 MG PO TABS: 50.0000 mg | ORAL_TABLET | Freq: Four times a day (QID) | ORAL | 0 refills | Status: DC | PRN

## 2018-12-29 NOTE — Telephone Encounter (Signed)
[place] Database Verified LR: 12-03-2018 Qty: 60 Pending appointment: 01-29-2019 Butler Denmark, NP)

## 2018-12-30 ENCOUNTER — Other Ambulatory Visit: Payer: Self-pay | Admitting: Neurology

## 2019-01-07 ENCOUNTER — Ambulatory Visit: Payer: Self-pay | Admitting: Neurology

## 2019-01-19 ENCOUNTER — Other Ambulatory Visit: Payer: Self-pay

## 2019-01-20 NOTE — Telephone Encounter (Signed)
Ultram prescription is not due until 27 January 2019.

## 2019-01-21 ENCOUNTER — Other Ambulatory Visit: Payer: Self-pay

## 2019-01-22 ENCOUNTER — Other Ambulatory Visit: Payer: Self-pay | Admitting: *Deleted

## 2019-01-22 NOTE — Progress Notes (Signed)
Ultram is not due until 27 January 2019, I will call in the prescription then.

## 2019-01-26 ENCOUNTER — Other Ambulatory Visit: Payer: Self-pay | Admitting: Neurology

## 2019-01-26 MED ORDER — TRAMADOL HCL 50 MG PO TABS: 50.0000 mg | ORAL_TABLET | Freq: Four times a day (QID) | ORAL | 0 refills | Status: DC | PRN

## 2019-01-28 NOTE — Progress Notes (Deleted)
PATIENT: Zachary Grant DOB: Mar 19, 1976  REASON FOR VISIT: follow up HISTORY FROM: patient  HISTORY OF PRESENT ILLNESS: Today 01/28/19  HISTORY  10/02/2018 SS: Zachary Grant is a 43 year old male with history of intractable headaches.  He is currently taking Effexor 150 mg daily.  His typical headache is on the right temporal area, has occurred since the loss of his hearing in his right ear.  His headache frequency has increased, is now occurring 3-4 times per week.  He describes the pain as pounding, pulsating.  He denies nausea, photophobia, phonophobia.  He is taking Ultram on a daily basis in the morning.  When he gets a headache, he will take ibuprofen.  His headaches do not impair his daily function.  He reports his memory and mood have improved with the increase in Effexor.  He says he has reached out with Dr. Erik Obey for ongoing evaluation of his right hearing loss, to see if anything else can be done.  He describes weather change or abnormal posture as triggers for his headaches.  In the past MRI of the brain was ordered, but was but was never completed.  He was prescribed Ubrelvy for acute headache, but it was too expensive and he was not able to get it filled.  In the past for his headaches, he has tried several other preventative medications including: Gabapentin, Lamictal, indomethacin, Topamax, Effexor, Ultram.  04/02/2018 Dr. Jannifer Franklin: [name] is a 43 year old right-handed white male with a history of intractable headaches. The patient had been having several headaches daily, he has a history of hearing loss in the right ear that started at the same time that the headaches began. The patient has been given a trial on Effexor which seems to be quite helpful for the headache, he has for the first time started having only 1 or 2 headaches a week rather than 1 or 2 headaches of the day. He has cut back on the Ultram use. The Effexor has improved his mood, he still has some mild memory  issues. MRI of the brain was ordered previously but never done. The patient returns to the office today for an evaluation. The patient currently is on 75 mg daily of Effexor  REVIEW OF SYSTEMS: Out of a complete 14 system review of symptoms, the patient complains only of the following symptoms, and all other reviewed systems are negative.  ALLERGIES: Allergies  Allergen Reactions   Gabapentin Other (See Comments)    Irritability    HOME MEDICATIONS: Outpatient Medications Prior to Visit  Medication Sig Dispense Refill   albuterol (PROVENTIL HFA;VENTOLIN HFA) 108 (90 Base) MCG/ACT inhaler Inhale 2 puffs into the lungs every 6 (six) hours as needed for wheezing or shortness of breath.     Erenumab-aooe (AIMOVIG) 140 MG/ML SOAJ Inject 140 mg into the skin every 30 (thirty) days. 1 pen 6   ibuprofen (ADVIL) 800 MG tablet Take 1 tablet (800 mg total) by mouth every 8 (eight) hours as needed. 30 tablet 0   nitroGLYCERIN (NITROSTAT) 0.4 MG SL tablet Place 1 tablet (0.4 mg total) under the tongue every 5 (five) minutes as needed for chest pain (CP or SOB). (Patient not taking: Reported on 10/02/2018) 25 tablet 2   predniSONE (DELTASONE) 5 MG tablet Take 6 pills for first day, 5 pills second day, 4 pills third day, 3 pills fourth day, 2 pills the fifth day, and 1 pill sixth day. (Patient not taking: Reported on 10/02/2018) 21 tablet 0   traMADol (  ULTRAM) 50 MG tablet Take 1 tablet (50 mg total) by mouth every 6 (six) hours as needed. Must last 28 days 60 tablet 0   Ubrogepant (UBRELVY) 100 MG TABS Take 100 mg by mouth 2 (two) times daily as needed. 30 tablet 1   venlafaxine XR (EFFEXOR XR) 150 MG 24 hr capsule Take 1 capsule (150 mg total) by mouth daily with breakfast. 90 capsule 1   Facility-Administered Medications Prior to Visit  Medication Dose Route Frequency Provider Last Rate Last Dose   gadopentetate dimeglumine (MAGNEVIST) injection 18 mL  18 mL Intravenous Once PRN Butch PennyMillikan,  Megan, NP        PAST MEDICAL HISTORY: Past Medical History:  Diagnosis Date   Bilateral headaches    Being worked up by neurology - 01/2014   Headache disorder 02/10/2014   Hearing loss of right ear    Being worked up by ENT 01/2014   Mild asthma    OSA on CPAP    per study  05-04-2013 mild osa   Traumatic rupture of left Achilles tendon    Wears contact lenses     PAST SURGICAL HISTORY: Past Surgical History:  Procedure Laterality Date   ACHILLES TENDON REPAIR Left 2014   and removal spur   ACHILLES TENDON SURGERY Left 09/04/2013   Procedure: LEFT ACHILLES TENDON REPAIR ;  Surgeon: Ludger NuttingN'Tuma Jah, MD;  Location: [facility];  Service: Podiatry;  Laterality: Left;   CARDIAC CATHETERIZATION N/A 04/27/2016   Procedure: Left Heart Cath and Coronary Angiography;  Surgeon: Yates DecampJay Ganji, MD;  [facility];  Service: Cardiovascular;  Laterality: N/A;   CARDIOVASCULAR STRESS TEST  05-25-2011   DR Jacinto HalimGANJI   NORMAL PERFUSION/  NORMAL EF   TONSILLECTOMY  age 43    FAMILY HISTORY: Family History  Problem Relation Age of Onset   Asthma Mother    Depression Brother     SOCIAL HISTORY: Social History   Socioeconomic History   Marital status: Married    Spouse name: Ashlee   Number of children: 2   Years of education: 12+   Highest education level: Not on file  Occupational History   Occupation: Materials engineermaintenance technician    Employer: [employer]    Comment: [employer]  Social Network [employer]   Financial resource strain: Not on file   Food insecurity    Worry: Not on file    Inability: Not on file   Transportation needs    Medical: Not on file    Non-medical: Not on file  Tobacco Use   Smoking status: Former Smoker    Packs/day: 1.00    Years: 7.00    Pack years: 7.00    Types: Cigarettes    Quit date: 06/03/2008    Years since quitting: 10.6   Smokeless tobacco: Never Used  Substance and Sexual Activity    Alcohol use: Yes    Comment: Occasionally    Drug use: No   Sexual activity: Not on file  Lifestyle   Physical activity    Days per week: Not on file    Minutes per session: Not on file   Stress: Not on file  Relationships   Social connections    Talks on phone: Not on file    Gets together: Not on file    Attends religious service: Not on file    Active member of club or organization: Not on file    Attends meetings of clubs or organizations:  Not on file    Relationship status: Not on file   Intimate partner violence    Fear of current or ex partner: Not on file    Emotionally abused: Not on file    Physically abused: Not on file    Forced sexual activity: Not on file  Other Topics Concern   Not on file  Social History Narrative   Patient lives wife and her grandparents.   Patient is right handed   Patient drinks about 2 cups of caffeine daily.   His two children from a previous relationship live with their mother.      Maternal grandfather rheumatoid arthritis, Maternal grandfather had neurosarcoid.      PHYSICAL EXAM  There were no vitals filed for this visit. There is no height or weight on file to calculate BMI.  Generalized: Well developed, in no acute distress   Neurological examination  Mentation: Alert oriented to time, place, history taking. Follows all commands speech and language fluent Cranial nerve II-XII: Pupils were equal round reactive to light. Extraocular movements were full, visual field were full on confrontational test. Facial sensation and strength were normal. Uvula tongue midline. Head turning and shoulder shrug  were normal and symmetric. Motor: The motor testing reveals 5 over 5 strength of all 4 extremities. Good symmetric motor tone is noted throughout.  Sensory: Sensory testing is intact to soft touch on all 4 extremities. No evidence of extinction is noted.  Coordination: Cerebellar testing reveals good finger-nose-finger and  heel-to-shin bilaterally.  Gait and station: Gait is normal. Tandem gait is normal. Romberg is negative. No drift is seen.  Reflexes: Deep tendon reflexes are symmetric and normal bilaterally.   DIAGNOSTIC DATA (LABS, IMAGING, TESTING) - I reviewed patient records, labs, notes, testing and imaging myself where available.  Lab Results  Component Value Date   WBC 7.5 08/18/2017   HGB 15.1 08/18/2017   HCT 44.1 08/18/2017   MCV 88.9 08/18/2017   PLT 176 08/18/2017      Component Value Date/Time   NA 140 12/09/2017 1101   K 4.3 12/09/2017 1101   CL 101 12/09/2017 1101   CO2 25 12/09/2017 1101   GLUCOSE 92 12/09/2017 1101   GLUCOSE 135 (H) 08/18/2017 0943   BUN 13 12/09/2017 1101   CREATININE 1.11 12/09/2017 1101   CALCIUM 9.6 12/09/2017 1101   PROT 6.9 12/09/2017 1101   ALBUMIN 4.5 12/09/2017 1101   AST 25 12/09/2017 1101   ALT 28 12/09/2017 1101   ALKPHOS 84 12/09/2017 1101   BILITOT 0.4 12/09/2017 1101   GFRNONAA 80 12/09/2017 1101   GFRAA 93 12/09/2017 1101   Lab Results  Component Value Date   CHOL 235 (H) 04/27/2016   HDL 39 (L) 04/27/2016   LDLCALC 169 (H) 04/27/2016   TRIG 137 04/27/2016   CHOLHDL 6.0 04/27/2016   No results found for: HGBA1C Lab Results  Component Value Date   VITAMINB12 545 12/09/2017   Lab Results  Component Value Date   TSH 1.140 12/09/2017      ASSESSMENT AND PLAN 43 y.o. year old male  has a past medical history of Bilateral headaches, Headache disorder (02/10/2014), Hearing loss of right ear, Mild asthma, OSA on CPAP, Traumatic rupture of left Achilles tendon, and Wears contact lenses. here with ***   I spent 15 minutes with the patient. 50% of this time was spent   Margie Ege, AGNP-C, DNP 01/28/2019, 1:30 PM [facility] [address], [address] [place],  [place] [address] [phone]

## 2019-01-29 ENCOUNTER — Ambulatory Visit: Payer: Self-pay | Admitting: Neurology

## 2019-01-29 ENCOUNTER — Encounter: Payer: Self-pay | Admitting: Neurology

## 2019-01-29 ENCOUNTER — Telehealth: Payer: Self-pay

## 2019-01-29 NOTE — Telephone Encounter (Signed)
Patient was a no call/no show for their appointment today.   

## 2019-02-19 ENCOUNTER — Other Ambulatory Visit: Payer: Self-pay

## 2019-02-19 MED ORDER — TRAMADOL HCL 50 MG PO TABS: 50.0000 mg | ORAL_TABLET | Freq: Four times a day (QID) | ORAL | 0 refills | Status: DC | PRN

## 2019-02-20 ENCOUNTER — Encounter: Payer: Self-pay | Admitting: Neurology

## 2019-02-20 ENCOUNTER — Other Ambulatory Visit: Payer: Self-pay | Admitting: Neurology

## 2019-03-13 ENCOUNTER — Other Ambulatory Visit: Payer: Self-pay | Admitting: Neurology

## 2019-03-17 MED ORDER — TRAMADOL HCL 50 MG PO TABS: 50.0000 mg | ORAL_TABLET | Freq: Four times a day (QID) | ORAL | 0 refills | Status: AC | PRN

## 2019-03-17 MED ORDER — TRAMADOL HCL 50 MG PO TABS: 50.0000 mg | ORAL_TABLET | Freq: Four times a day (QID) | ORAL | 0 refills | Status: DC | PRN

## 2019-04-17 ENCOUNTER — Other Ambulatory Visit: Payer: Self-pay | Admitting: Neurology

## 2019-04-20 ENCOUNTER — Other Ambulatory Visit: Payer: Self-pay | Admitting: Neurology

## 2019-04-21 ENCOUNTER — Encounter: Payer: Self-pay | Admitting: Neurology

## 2019-05-28 ENCOUNTER — Telehealth: Payer: 59 | Admitting: Emergency Medicine

## 2019-05-28 DIAGNOSIS — M5440 Lumbago with sciatica, unspecified side: Secondary | ICD-10-CM | POA: Diagnosis not present

## 2019-05-28 MED ORDER — CYCLOBENZAPRINE HCL 10 MG PO TABS: 10.0000 mg | ORAL_TABLET | Freq: Three times a day (TID) | ORAL | 0 refills | Status: DC | PRN

## 2019-05-28 MED ORDER — PREDNISONE 20 MG PO TABS: 40.0000 mg | ORAL_TABLET | Freq: Every day | ORAL | 0 refills | Status: DC

## 2019-05-28 MED ORDER — IBUPROFEN 800 MG PO TABS: 800.0000 mg | ORAL_TABLET | Freq: Three times a day (TID) | ORAL | 0 refills | Status: AC

## 2019-05-28 NOTE — Progress Notes (Signed)
We are sorry that you are not feeling well.  Here is how we plan to help!  Based on what you have shared with me it looks like you mostly have acute back pain with sciatica or lumbar radiculopathy.  Acute back pain is defined as musculoskeletal pain that can resolve in 1-3 weeks with conservative treatment.  I have prescribed Ibuprofen 800mg  to be taken breakfast, lunch, and dinner for the next 7 days.  I've also prescribed a Prednisone taper, which should help with the pain that radiates down the leg.  Lastly, I've prescribed a muscle relaxer.  Some patients experience stomach irritation or in increased heartburn with anti-inflammatory drugs.  Please keep in mind that muscle relaxer's can cause fatigue and should not be taken while at work or driving.  Back pain is very common.  The pain often gets better over time.  The cause of back pain is usually not dangerous.  Most people can learn to manage their back pain on their own.  The medications I've prescribed are not an instant fix, but should begin working over the next 24-48 hours.  In the meantime, use the tips below.  Additionally, if you have any of the following, you need to get help immediately. -Paralysis (leg gives out due to weakness) -Fever -Incontinence of urine or stool  Home Care  Stay active.  Start with short walks on flat ground if you can.  Try to walk farther each day.  Do not sit, drive or stand in one place for more than 30 minutes.  Do not stay in bed.  Do not avoid exercise or work.  Activity can help your back heal faster.  Be careful when you bend or lift an object.  Bend at your knees, keep the object close to you, and do not twist.  Sleep on a firm mattress.  Lie on your side, and bend your knees.  If you lie on your back, put a pillow under your knees.  Only take medicines as told by your doctor.  Put ice on the injured area.  Put ice in a plastic bag  Place a towel between your skin and the bag  Leave  the ice on for 15-20 minutes, 3-4 times a day for the first 2-3 days. 210 After that, you can switch between ice and heat packs.  Ask your doctor about back exercises or massage.  Avoid feeling anxious or stressed.  Find good ways to deal with stress, such as exercise.  Get Help Right Way If:  Your pain does not go away with rest or medicine.  Your pain does not go away in 1 week.  You have new problems.  You do not feel well.  The pain spreads into your legs.  You cannot control when you poop (bowel movement) or pee (urinate)  You feel sick to your stomach (nauseous) or throw up (vomit)  You have belly (abdominal) pain.  You feel like you may pass out (faint).  If you develop a fever.  Make Sure you:  Understand these instructions.  Will watch your condition  Will get help right away if you are not doing well or get worse.  Your e-visit answers were reviewed by a board certified advanced clinical practitioner to complete your personal care plan.  Depending on the condition, your plan could have included both over the counter or prescription medications.  If there is a problem please reply  once you have received a response from your provider.  Your safety is important to [place].  If you have drug allergies check your prescription carefully.    You can use MyChart to ask questions about today's visit, request a non-urgent call back, or ask for a work or school excuse for 24 hours related to this e-Visit. If it has been greater than 24 hours you will need to follow up with your provider, or enter a new e-Visit to address those concerns.  You will get an e-mail in the next two days asking about your experience.  I hope that your e-visit has been valuable and will speed your recovery. Thank you for using e-visits.  Greater than 5 minutes, yet less than 10 minutes was used in reviewing the patient's chart, questionnaire, prescribing medications, and documentation for this  visit.

## 2019-06-30 ENCOUNTER — Emergency Department (HOSPITAL_BASED_OUTPATIENT_CLINIC_OR_DEPARTMENT_OTHER)
Admission: EM | Admit: 2019-06-30 | Discharge: 2019-07-01 | Disposition: A | Discharge status: Home / Self Care | Payer: 59 | Attending: Emergency Medicine | Admitting: Emergency Medicine

## 2019-06-30 ENCOUNTER — Encounter (HOSPITAL_BASED_OUTPATIENT_CLINIC_OR_DEPARTMENT_OTHER): Payer: Self-pay | Admitting: Oncology

## 2019-06-30 ENCOUNTER — Emergency Department (HOSPITAL_BASED_OUTPATIENT_CLINIC_OR_DEPARTMENT_OTHER): Payer: 59

## 2019-06-30 ENCOUNTER — Other Ambulatory Visit: Payer: Self-pay

## 2019-06-30 DIAGNOSIS — J45909 Unspecified asthma, uncomplicated: Secondary | ICD-10-CM | POA: Diagnosis not present

## 2019-06-30 DIAGNOSIS — R079 Chest pain, unspecified: Secondary | ICD-10-CM | POA: Insufficient documentation

## 2019-06-30 DIAGNOSIS — Z79899 Other long term (current) drug therapy: Secondary | ICD-10-CM | POA: Diagnosis not present

## 2019-06-30 DIAGNOSIS — Z87891 Personal history of nicotine dependence: Secondary | ICD-10-CM | POA: Insufficient documentation

## 2019-06-30 DIAGNOSIS — I259 Chronic ischemic heart disease, unspecified: Secondary | ICD-10-CM | POA: Insufficient documentation

## 2019-06-30 LAB — CBC
HCT: 38.8 % — ABNORMAL LOW (ref 39.0–52.0)
Hemoglobin: 13.6 g/dL (ref 13.0–17.0)
MCH: 32 pg (ref 26.0–34.0)
MCHC: 35.1 g/dL (ref 30.0–36.0)
MCV: 91.3 fL (ref 80.0–100.0)
Platelets: 183 10*3/uL (ref 150–400)
RBC: 4.25 MIL/uL (ref 4.22–5.81)
RDW: 12.4 % (ref 11.5–15.5)
WBC: 5.7 10*3/uL (ref 4.0–10.5)
nRBC: 0 % (ref 0.0–0.2)

## 2019-06-30 LAB — BASIC METABOLIC PANEL
Anion gap: 9 (ref 5–15)
BUN: 18 mg/dL (ref 6–20)
CO2: 25 mmol/L (ref 22–32)
Calcium: 8.7 mg/dL — ABNORMAL LOW (ref 8.9–10.3)
Chloride: 103 mmol/L (ref 98–111)
Creatinine, Ser: 1.21 mg/dL (ref 0.61–1.24)
GFR calc Af Amer: >60 mL/min (ref >60)
GFR calc non Af Amer: >60 mL/min (ref >60)
Glucose, Bld: 105 mg/dL — ABNORMAL HIGH (ref 70–99)
Potassium: 3.6 mmol/L (ref 3.5–5.1)
Sodium: 137 mmol/L (ref 135–145)

## 2019-06-30 LAB — TROPONIN I (HIGH SENSITIVITY): Troponin I (High Sensitivity): 3 ng/L (ref <18)

## 2019-06-30 LAB — CBG MONITORING, ED: Glucose-Capillary: 140 mg/dL — ABNORMAL HIGH (ref 70–99)

## 2019-06-30 MED ORDER — NITROGLYCERIN 0.4 MG SL SUBL
0.4000 mg | SUBLINGUAL_TABLET | SUBLINGUAL | Status: AC | PRN
  Administered 2019-06-30 (×2): 0.4 mg via SUBLINGUAL
  Filled 2019-06-30: qty 1

## 2019-06-30 NOTE — ED Notes (Signed)
ED Provider at bedside. 

## 2019-06-30 NOTE — ED Provider Notes (Signed)
[facility] EMERGENCY DEPARTMENT Provider Note   CSN: [id number] Arrival date & time: 06/30/19  2126     History Chief Complaint  Patient presents with  . Chest Pain    Aseel Uhde is a 44 y.o. male who presents to the ED today complaining of sudden onset, constant, heavy, substernal chest pain that began around 8:30 PM tonight while sitting on the couch.  Also complains of shortness of breath with it.  He reports his wife gave him #3 81 mg ASA as well as a NTG with no change prompting him to come to the ED today. Pt denies any radiation down his arms, into his jaw, or into his back. No recent prolonged travel or immobilization. No hx DVT/PE. No hemoptysis. No exogenous hormone use. No malignancy. Pt is a former smoker.   Per chart review pt had a left heart cath and coronary angiography on 04/27/2016 with findings of:  Coronary angiogram 04/27/2016: Normal LV systolic function, year 55-60%. Low LVEDP. Mild coronary calcification involving the proximal and mid LAD. Otherwise normal coronary arteries, left dominant circulation. Right is very small and normal.  Impression: Suspect his ACS was probably related to coronary spasm. He can be discharged home today with resuming statins and also I will add amlodipine 5 mg daily. Continued risk factor modification is indicated. Needs statins indefinitely.   The history is provided by the patient and medical records.    HPI: A 44 year old patient with a history of obesity presents for evaluation of chest pain. Initial onset of pain was approximately 1-3 hours ago. The patient's chest pain is described as heaviness/pressure/tightness, is not worse with exertion and is relieved by nitroglycerin. The patient's chest pain is middle- or left-sided, is not well-localized, is not sharp and does not radiate to the arms/jaw/neck. The patient does not complain of nausea and denies diaphoresis. The patient has a family history of coronary artery  disease in a first-degree relative with onset less than age 64. The patient has no history of stroke, has no history of peripheral artery disease, has not smoked in the past 90 days, denies any history of treated diabetes, is not hypertensive and has no history of hypercholesterolemia.   Past Medical History:  Diagnosis Date  . Bilateral headaches    Being worked up by neurology - 01/2014  . Headache disorder 02/10/2014  . Hearing loss of right ear    Being worked up by ENT 01/2014  . Mild asthma   . OSA on CPAP    per study  05-04-2013 mild osa  . Traumatic rupture of left Achilles tendon   . Wears contact lenses     Patient Active Problem List   Diagnosis Date Noted  . Cervical radiculopathy 08/19/2018  . Unstable angina pectoris (HCC) 04/27/2016  . Coronary artery disease 04/27/2016  . Hearing loss of right ear 07/14/2014  . Headache disorder 02/10/2014  . OSA on CPAP 02/06/2014  . Asthma, chronic 02/06/2014  . Achilles rupture, left 02/06/2014    Past Surgical History:  Procedure Laterality Date  . ACHILLES TENDON REPAIR Left 2014   and removal spur  . ACHILLES TENDON SURGERY Left 09/04/2013   Procedure: LEFT ACHILLES TENDON REPAIR ;  Surgeon: Ludger Nutting, MD;  Location: [facility];  Service: Podiatry;  Laterality: Left;  . CARDIAC CATHETERIZATION N/A 04/27/2016   Procedure: Left Heart Cath and Coronary Angiography;  Surgeon: Yates Decamp, MD;  Location: [facility] INVASIVE CV LAB;  Service: Cardiovascular;  Laterality:  N/A;  . CARDIOVASCULAR STRESS TEST  05-25-2011   DR Jacinto Halim   NORMAL PERFUSION/  NORMAL EF  . TONSILLECTOMY  age 4       Family History  Problem Relation Age of Onset  . Asthma Mother   . Depression Brother     Social History   Tobacco Use  . Smoking status: Former Smoker    Packs/day: 1.00    Years: 7.00    Pack years: 7.00    Types: Cigarettes    Quit date: 06/03/2008    Years since quitting: 11.0  . Smokeless tobacco: Never Used    Substance Use Topics  . Alcohol use: Yes    Comment: Occasionally   . Drug use: No    Home Medications Prior to Admission medications   Medication Sig Start Date End Date Taking? Authorizing Provider  albuterol (PROVENTIL HFA;VENTOLIN HFA) 108 (90 Base) MCG/ACT inhaler Inhale 2 puffs into the lungs every 6 (six) hours as needed for wheezing or shortness of breath.    [provider]  cyclobenzaprine (FLEXERIL) 10 MG tablet Take 1 tablet (10 mg total) by mouth 3 (three) times daily as needed for muscle spasms. 05/28/19   Roxy Horseman, PA-C  Erenumab-aooe (AIMOVIG) 140 MG/ML SOAJ Inject 140 mg into the skin every 30 (thirty) days. 10/02/18   Glean Salvo, NP  ibuprofen (ADVIL) 800 MG tablet Take 1 tablet (800 mg total) by mouth 3 (three) times daily. 05/28/19   Roxy Horseman, PA-C  nitroGLYCERIN (NITROSTAT) 0.4 MG SL tablet Place 1 tablet (0.4 mg total) under the tongue every 5 (five) minutes as needed for chest pain (CP or SOB). Patient not taking: Reported on 10/02/2018 04/27/16   Yates Decamp, MD  predniSONE (DELTASONE) 20 MG tablet Take 2 tablets (40 mg total) by mouth daily. Take 40 mg by mouth daily for 3 days, then 20mg  by mouth daily for 3 days, then 10mg  daily for 3 days 05/28/19   , PA-C  Ubrogepant (UBRELVY) 100 MG TABS Take 100 mg by mouth 2 (two) times daily as needed. 07/11/18   Roxy Horseman, MD  venlafaxine XR (EFFEXOR XR) 150 MG 24 hr capsule Take 1 capsule (150 mg total) by mouth daily with breakfast. 10/02/18   York Spaniel, NP    Allergies    Gabapentin  Review of Systems   Review of Systems  Constitutional: Negative for chills and fever.  Respiratory: Positive for shortness of breath. Negative for cough.   Cardiovascular: Positive for chest pain. Negative for palpitations and leg swelling.  Gastrointestinal: Negative for abdominal pain, nausea and vomiting.  All other systems reviewed and are negative.   Physical Exam Updated Vital  Signs BP 138/78 (BP Location: Right Arm)   Pulse 97   Temp 98.4 F (36.9 C) (Oral)   Resp (!) 22   Ht 5\' 8"  (1.727 m)   Wt 90.7 kg   SpO2 100%   BMI 30.41 kg/m   Physical Exam Vitals and nursing note reviewed.  Constitutional:      Appearance: He is not ill-appearing or diaphoretic.  HENT:     Head: Normocephalic and atraumatic.  Eyes:     Conjunctiva/sclera: Conjunctivae normal.  Cardiovascular:     Rate and Rhythm: Normal rate and regular rhythm.     Pulses:          Radial pulses are 2+ on the right side and 2+ on the left side.       Dorsalis pedis  pulses are 2+ on the right side and 2+ on the left side.     Heart sounds: Normal heart sounds.  Pulmonary:     Effort: Pulmonary effort is normal.     Breath sounds: Normal breath sounds. No decreased breath sounds, wheezing, rhonchi or rales.  Chest:     Chest wall: No tenderness.  Abdominal:     Palpations: Abdomen is soft.     Tenderness: There is no abdominal tenderness. There is no guarding or rebound.  Musculoskeletal:     Cervical back: Neck supple.  Skin:    General: Skin is warm and dry.  Neurological:     Mental Status: He is alert.     ED Results / Procedures / Treatments   Labs (all labs ordered are listed, but only abnormal results are displayed) Labs Reviewed  BASIC METABOLIC PANEL - Abnormal; Notable for the following components:      Result Value   Glucose, Bld 105 (*)    Calcium 8.7 (*)    All other components within normal limits  CBC - Abnormal; Notable for the following components:   HCT 38.8 (*)    All other components within normal limits  CBG MONITORING, ED - Abnormal; Notable for the following components:   Glucose-Capillary 140 (*)    All other components within normal limits  TROPONIN I (HIGH SENSITIVITY)    EKG EKG Interpretation  Date/Time:  Tuesday June 30 2019 21:32:45 EDT Ventricular Rate:  85 PR Interval:    QRS Duration: 97 QT Interval:  363 QTC Calculation: 432 R  Axis:   19 Text Interpretation: Sinus rhythm Baseline wander in lead(s) II aVR V4 V6 ST depression in Lateral leads (borderline) Reconfirmed by Fredia Sorrow [phone]) on 06/30/2019 9:42:27 PM   Radiology DG Chest Portable 1 View  Result Date: 06/30/2019 CLINICAL DATA:  Chest pain EXAM: PORTABLE CHEST 1 VIEW COMPARISON:  Radiograph 04/26/2016, CT 04/26/2011 FINDINGS: Low volumes with streaky opacities in the bases favoring atelectasis. No consolidation, features of edema, pneumothorax, or effusion. The cardiomediastinal contours are unremarkable. No acute osseous or soft tissue abnormality. Telemetry leads overlie the chest. IMPRESSION: No acute cardiopulmonary abnormality. Electronically Signed   By: Lovena Le M.D.   On: 06/30/2019 22:04    Procedures Procedures (including critical care time)  Medications Ordered in ED Medications  nitroGLYCERIN (NITROSTAT) SL tablet 0.4 mg (0.4 mg Sublingual Given 06/30/19 2219)    ED Course  I have reviewed the triage vital signs and the nursing notes.  Pertinent labs & imaging results that were available during my care of the patient were reviewed by me and considered in my medical decision making (see chart for details).  44 year old male who presents the ED today complaining of sudden onset substernal chest pain and shortness of breath that started approximately 1 hour prior to arrival.  Had already taken 381 mg aspirin as well as 1 nitroglycerin with no change in symptoms.  Will give an additional 2 nitroglycerin and reevaluate.  On arrival to the ED patient is afebrile, nontachycardic and nontachypneic.  He is not diaphoretic peers to be in no acute distress however states he is having about 4 out of 10 chest pain.  Will work-up for ACS at this time.  Patient does have a history of CAD as well as unstable angina.  He did have a coronary angiography and left heart cath done in 2018 with Dr. Einar Gip with mild calcification however no significant  findings.   EKG does  have some ST depression in the lateral leads changed from previous.   Has received a total of 2 nitroglycerin here in the ED as well as 1 at home and states he is no longer having any chest pain nor does he feel short of breath.   CBC without leukocytosis.  Hemoglobin stable. BMP with glucose 105.  No other electrolyte abnormalities.  Creatinine 1.21 appears to be patient's baseline. Initial troponin of 3. Will repeat.  CXR clear.   Pt continues to be chest pain free. Will await second trop.  At shift change case signed out to oncoming ED provider Dr. Bebe Shaggy who will dispo accordingly after second troponin.     MDM Rules/Calculators/A&P HEAR Score: 4                     Final Clinical Impression(s) / ED Diagnoses Final diagnoses:  Nonspecific chest pain    Rx / DC Orders ED Discharge Orders    None       Tanda Rockers, PA-C 06/30/19 2310    Zadie Rhine, MD 07/01/19 [phone]

## 2019-06-30 NOTE — ED Triage Notes (Signed)
Pt c/o sudden onset central CP that began at 2030 while pt was sitting on couch.  Pt endorses shob, dizziness, mild confusion.  Pt took 3 810 mg asa at 2100 as well as nitro x 1 at 2115.  Pt does have cardiac hx.

## 2019-07-01 LAB — TROPONIN I (HIGH SENSITIVITY): Troponin I (High Sensitivity): 2 ng/L (ref <18)

## 2019-07-01 MED ORDER — ASPIRIN EC 325 MG PO TBEC: 325.0000 mg | DELAYED_RELEASE_TABLET | Freq: Every day | ORAL | 0 refills | Status: AC

## 2019-07-01 MED ORDER — NITROGLYCERIN 0.4 MG SL SUBL: 0.4000 mg | SUBLINGUAL_TABLET | SUBLINGUAL | 0 refills | Status: AC | PRN

## 2019-07-01 NOTE — Discharge Instructions (Signed)

## 2019-08-04 ENCOUNTER — Other Ambulatory Visit: Payer: Self-pay | Admitting: Neurology

## 2019-08-04 ENCOUNTER — Telehealth: Payer: 59 | Admitting: Physician Assistant

## 2019-08-04 DIAGNOSIS — M549 Dorsalgia, unspecified: Secondary | ICD-10-CM

## 2019-08-04 MED ORDER — NAPROXEN 500 MG PO TABS: 500.0000 mg | ORAL_TABLET | Freq: Two times a day (BID) | ORAL | 0 refills | Status: AC

## 2019-08-04 MED ORDER — CYCLOBENZAPRINE HCL 10 MG PO TABS: 10.0000 mg | ORAL_TABLET | Freq: Two times a day (BID) | ORAL | 0 refills | Status: AC | PRN

## 2019-08-04 NOTE — Progress Notes (Signed)
We are sorry that you are not feeling well.  Here is how we plan to help!  Based on what you have shared with me it looks like you mostly have acute back pain.  Acute back pain is defined as musculoskeletal pain that can resolve in 1-3 weeks with conservative treatment.  I have prescribed Naprosyn 500 mg take one by mouth twice a day non-steroid anti-inflammatory (NSAID) as well as Flexeril 10 mg up to twice daily as needed which is a muscle relaxer.   Some patients experience stomach irritation or in increased heartburn with anti-inflammatory drugs.  Please keep in mind that muscle relaxer's can cause fatigue and should not be taken while at work or driving. Do not drink alcohol with muscle relaxer. You can take extra strength every 6 hours with your symptoms as well.    Back pain is very common.  The pain often gets better over time.  The cause of back pain is usually not dangerous.  Most people can learn to manage their back pain on their own.  Home Care  Stay active.  Start with short walks on flat ground if you can.  Try to walk farther each day.  Do not sit, drive or stand in one place for more than 30 minutes.  Do not stay in bed.  Do not avoid exercise or work.  Activity can help your back heal faster.  Be careful when you bend or lift an object.  Bend at your knees, keep the object close to you, and do not twist.  Sleep on a firm mattress.  Lie on your side, and bend your knees.  If you lie on your back, put a pillow under your knees.  Only take medicines as told by your doctor.  Put ice on the injured area.  Put ice in a plastic bag  Place a towel between your skin and the bag  Leave the ice on for 15-20 minutes, 3-4 times a day for the first 2-3 days. 210 After that, you can switch between ice and heat packs.  Ask your doctor about back exercises or massage.  Avoid feeling anxious or stressed.  Find good ways to deal with stress, such as exercise.  Get Help Right Way  If:  Your pain does not go away with rest or medicine.  Your pain does not go away in 1 week.  You have new problems.  You do not feel well.  The pain spreads into your legs.  You cannot control when you poop (bowel movement) or pee (urinate)  You feel sick to your stomach (nauseous) or throw up (vomit)  You have belly (abdominal) pain.  You feel like you may pass out (faint).  If you develop a fever.  Make Sure you:  Understand these instructions.  Will watch your condition  Will get help right away if you are not doing well or get worse.  Your e-visit answers were reviewed by a board certified advanced clinical practitioner to complete your personal care plan.  Depending on the condition, your plan could have included both over the counter or prescription medications.  If there is a problem please reply  once you have received a response from your provider.  Your safety is important to [place].  If you have drug allergies check your prescription carefully.    You can use MyChart to ask questions about today's visit, request a non-urgent call back, or ask for a work or school excuse for 24 hours  related to this e-Visit. If it has been greater than 24 hours you will need to follow up with your provider, or enter a new e-Visit to address those concerns.  You will get an e-mail in the next two days asking about your experience.  I hope that your e-visit has been valuable and will speed your recovery. Thank you for using e-visits.  Greater than 5 minutes, yet less than 10 minutes of time have been spent researching, coordinating, and implementing care for this patient today.

## 2019-11-23 ENCOUNTER — Telehealth: Payer: 59 | Admitting: Family

## 2019-11-23 DIAGNOSIS — J069 Acute upper respiratory infection, unspecified: Secondary | ICD-10-CM

## 2019-11-23 MED ORDER — PREDNISONE 10 MG (21) PO TBPK: ORAL_TABLET | ORAL | 0 refills | Status: AC

## 2019-11-23 NOTE — Progress Notes (Signed)
We are sorry that you are not feeling well.  Here is how we plan to help!  Based on your presentation I believe you most likely have A cough due to a virus.  This is called viral bronchitis and is best treated by rest, plenty of fluids and control of the cough.  You may use Ibuprofen or Tylenol as directed to help your symptoms.   Given your symptoms, you also need to be tested for COVID.    In addition you may use A non-prescription cough medication called Robitussin DAC. Take 2 teaspoons every 8 hours or Delsym: take 2 teaspoons every 12 hours. and A non-prescription cough medication called Mucinex DM: take 2 tablets every 12 hours.  Prednisone 10 mg daily for 6 days (see taper instructions below)  Directions for 6 day taper: Day 1: 2 tablets before breakfast, 1 after both lunch & dinner and 2 at bedtime Day 2: 1 tab before breakfast, 1 after both lunch & dinner and 2 at bedtime Day 3: 1 tab at each meal & 1 at bedtime Day 4: 1 tab at breakfast, 1 at lunch, 1 at bedtime Day 5: 1 tab at breakfast & 1 tab at bedtime Day 6: 1 tab at breakfast   From your responses in the eVisit questionnaire you describe inflammation in the upper respiratory tract which is causing a significant cough.  This is commonly called Bronchitis and has four common causes:    Allergies  Viral Infections  Acid Reflux  Bacterial Infection Allergies, viruses and acid reflux are treated by controlling symptoms or eliminating the cause. An example might be a cough caused by taking certain blood pressure medications. You stop the cough by changing the medication. Another example might be a cough caused by acid reflux. Controlling the reflux helps control the cough.  USE OF BRONCHODILATOR ("RESCUE") INHALERS: There is a risk from using your bronchodilator too frequently.  The risk is that over-reliance on a medication which only relaxes the muscles surrounding the breathing tubes can reduce the effectiveness of  medications prescribed to reduce swelling and congestion of the tubes themselves.  Although you feel brief relief from the bronchodilator inhaler, your asthma may actually be worsening with the tubes becoming more swollen and filled with mucus.  This can delay other crucial treatments, such as oral steroid medications. If you need to use a bronchodilator inhaler daily, several times per day, you should discuss this with your provider.  There are probably better treatments that could be used to keep your asthma under control.     HOME CARE . Only take medications as instructed by your medical team. . Complete the entire course of an antibiotic. . Drink plenty of fluids and get plenty of rest. . Avoid close contacts especially the very young and the elderly . Cover your mouth if you cough or cough into your sleeve. . Always remember to wash your hands . A steam or ultrasonic humidifier can help congestion.   GET HELP RIGHT AWAY IF: . You develop worsening fever. . You become short of breath . You cough up blood. . Your symptoms persist after you have completed your treatment plan MAKE SURE YOU   Understand these instructions.  Will watch your condition.  Will get help right away if you are not doing well or get worse.  Your e-visit answers were reviewed by a board certified advanced clinical practitioner to complete your personal care plan.  Depending on the condition, your plan could have  included both over the counter or prescription medications. If there is a problem please reply  once you have received a response from your provider. Your safety is important to [place].  If you have drug allergies check your prescription carefully.    You can use MyChart to ask questions about today's visit, request a non-urgent call back, or ask for a work or school excuse for 24 hours related to this e-Visit. If it has been greater than 24 hours you will need to follow up with your provider, or enter a new  e-Visit to address those concerns. You will get an e-mail in the next two days asking about your experience.  I hope that your e-visit has been valuable and will speed your recovery. Thank you for using e-visits.   Approximately 5 minutes was spent documenting and reviewing patient's chart.

## 2021-07-07 ENCOUNTER — Other Ambulatory Visit: Payer: Self-pay

## 2021-07-07 ENCOUNTER — Emergency Department (HOSPITAL_BASED_OUTPATIENT_CLINIC_OR_DEPARTMENT_OTHER)
Admission: EM | Admit: 2021-07-07 | Discharge: 2021-07-07 | Disposition: A | Discharge status: Home / Self Care | Payer: BC Managed Care – PPO | Attending: Emergency Medicine | Admitting: Emergency Medicine

## 2021-07-07 ENCOUNTER — Encounter (HOSPITAL_BASED_OUTPATIENT_CLINIC_OR_DEPARTMENT_OTHER): Payer: Self-pay | Admitting: Emergency Medicine

## 2021-07-07 DIAGNOSIS — Z7982 Long term (current) use of aspirin: Secondary | ICD-10-CM | POA: Diagnosis not present

## 2021-07-07 DIAGNOSIS — J45909 Unspecified asthma, uncomplicated: Secondary | ICD-10-CM | POA: Insufficient documentation

## 2021-07-07 DIAGNOSIS — Z20822 Contact with and (suspected) exposure to covid-19: Secondary | ICD-10-CM | POA: Diagnosis not present

## 2021-07-07 DIAGNOSIS — R197 Diarrhea, unspecified: Secondary | ICD-10-CM | POA: Insufficient documentation

## 2021-07-07 LAB — COMPREHENSIVE METABOLIC PANEL
ALT: 25 U/L (ref 0–44)
AST: 20 U/L (ref 15–41)
Albumin: 4.1 g/dL (ref 3.5–5.0)
Alkaline Phosphatase: 75 U/L (ref 38–126)
Anion gap: 11 (ref 5–15)
BUN: 18 mg/dL (ref 6–20)
CO2: 21 mmol/L — ABNORMAL LOW (ref 22–32)
Calcium: 8.5 mg/dL — ABNORMAL LOW (ref 8.9–10.3)
Chloride: 102 mmol/L (ref 98–111)
Creatinine, Ser: 1.12 mg/dL (ref 0.61–1.24)
GFR, Estimated: >60 mL/min (ref >60)
Glucose, Bld: 120 mg/dL — ABNORMAL HIGH (ref 70–99)
Potassium: 3.7 mmol/L (ref 3.5–5.1)
Sodium: 134 mmol/L — ABNORMAL LOW (ref 135–145)
Total Bilirubin: 0.6 mg/dL (ref 0.3–1.2)
Total Protein: 7.3 g/dL (ref 6.5–8.1)

## 2021-07-07 LAB — C DIFFICILE QUICK SCREEN W PCR REFLEX
C Diff antigen: NEGATIVE
C Diff interpretation: NOT DETECTED
C Diff toxin: NEGATIVE

## 2021-07-07 LAB — CBC
HCT: 41.7 % (ref 39.0–52.0)
Hemoglobin: 14.7 g/dL (ref 13.0–17.0)
MCH: 30.8 pg (ref 26.0–34.0)
MCHC: 35.3 g/dL (ref 30.0–36.0)
MCV: 87.2 fL (ref 80.0–100.0)
Platelets: 180 10*3/uL (ref 150–400)
RBC: 4.78 MIL/uL (ref 4.22–5.81)
RDW: 12.8 % (ref 11.5–15.5)
WBC: 6.5 10*3/uL (ref 4.0–10.5)
nRBC: 0 % (ref 0.0–0.2)

## 2021-07-07 LAB — RESP PANEL BY RT-PCR (FLU A&B, COVID) ARPGX2
Influenza A by PCR: NEGATIVE
Influenza B by PCR: NEGATIVE
SARS Coronavirus 2 by RT PCR: NEGATIVE

## 2021-07-07 LAB — LIPASE, BLOOD: Lipase: 23 U/L (ref 11–51)

## 2021-07-07 MED ORDER — FAMOTIDINE IN NACL 20-0.9 MG/50ML-% IV SOLN
20.0000 mg | Freq: Once | INTRAVENOUS | Status: AC
  Administered 2021-07-07: 20 mg via INTRAVENOUS
  Filled 2021-07-07: qty 50

## 2021-07-07 MED ORDER — LIDOCAINE VISCOUS HCL 2 % MT SOLN
15.0000 mL | Freq: Once | OROMUCOSAL | Status: AC
  Administered 2021-07-07: 15 mL via ORAL
  Filled 2021-07-07: qty 15

## 2021-07-07 MED ORDER — DICYCLOMINE HCL 10 MG PO CAPS
10.0000 mg | ORAL_CAPSULE | Freq: Once | ORAL | Status: AC
  Administered 2021-07-07: 10 mg via ORAL
  Filled 2021-07-07: qty 1

## 2021-07-07 MED ORDER — LACTATED RINGERS IV BOLUS
1000.0000 mL | Freq: Once | INTRAVENOUS | Status: AC
  Administered 2021-07-07: 1000 mL via INTRAVENOUS

## 2021-07-07 MED ORDER — ALUM & MAG HYDROXIDE-SIMETH 200-200-20 MG/5ML PO SUSP
30.0000 mL | Freq: Once | ORAL | Status: AC
  Administered 2021-07-07: 30 mL via ORAL
  Filled 2021-07-07: qty 30

## 2021-07-07 NOTE — ED Notes (Signed)
Pt ambulatory to waiting room. Pt verbalized understanding of discharge instructions.   

## 2021-07-07 NOTE — Discharge Instructions (Addendum)
It was a pleasure caring for you today in the emergency department. ? ?Be sure to wash your hands frequently.  Recommend you clean your bathroom with bleach ? ?Please return to the emergency department for any worsening or worrisome symptoms. ? ?

## 2021-07-07 NOTE — ED Triage Notes (Signed)
Pt reports diarrhea with abdominal cramping that started Monday after eating red meat. Pt reports he "felt fine for a few days after that." Pt reports the abdominal pain and diarrhea began again after eating red meat on Thursday.  ?

## 2021-07-07 NOTE — ED Provider Notes (Signed)
?[facility] EMERGENCY DEPARTMENT ?Provider Note ? ? ?CSN: [id number] ?Arrival date & time: 07/07/21  0803 ? ?  ? ?History ? ?Chief Complaint  ?Patient presents with  ? Diarrhea  ? ? ?Zachary Grant is a 46 y.o. male. ? ?This is a 46 y.o. m  with significant medical history as below, including asthma, OSA on CPAP who presents to the ED with complaint of diarrhea. ? ?Sudden onset diarrhea for duration approximately 5 days.  Works in a nursing facility.  There is a reported increase of COVID-19 infections there.  He ate a bacon cheeseburger, that evening had abdominal pain, copious diarrhea.  Ongoing diarrhea for the past 5 days, worsened in the past 24 hours.  No fevers, chills, vomiting.  He has nausea w/o emesis. Took Pepto-Bismol last pm which did improve his abdominal cramping.  Diarrhea.  Stool is liquid, no formed stool.  ? ?No recent travel or sick contacts, no recent antibiotic use.  ? ? ?Past Medical History: ?No date: Bilateral headaches ?    Comment:  Being worked up by neurology - 01/2014 ?02/10/2014: Headache disorder ?No date: Hearing loss of right ear ?    Comment:  Being worked up by ENT 01/2014 ?No date: Mild asthma ?No date: OSA on CPAP ?    Comment:  per study  05-04-2013 mild osa ?No date: Traumatic rupture of left Achilles tendon ?No date: Wears contact lenses ? ?Past Surgical History: ?2014: ACHILLES TENDON REPAIR; Left ?    Comment:  and removal spur ?09/04/2013: ACHILLES TENDON SURGERY; Left ?    Comment:  Procedure: LEFT ACHILLES TENDON REPAIR ;  Surgeon:  ?             Ludger Nutting, MD;  Location: [facility];   ?             Service: Podiatry;  Laterality: Left; ?04/27/2016: CARDIAC CATHETERIZATION; N/A ?    Comment:  Procedure: Left Heart Cath and Coronary Angiography;   ?             Surgeon: Yates Decamp, MD;  Location: [facility] INVASIVE CV LAB;   ?             Service: Cardiovascular;  Laterality: N/A; ?05-25-2011   DR Jacinto Halim: CARDIOVASCULAR STRESS TEST ?    Comment:  NORMAL  PERFUSION/  NORMAL EF ?age 59: TONSILLECTOMY  ? ? ?The history is provided by the patient and the spouse. No language interpreter was used.  ?Diarrhea ?Associated symptoms: abdominal pain   ?Associated symptoms: no chills, no fever, no headaches and no vomiting   ? ?  ? ?Home Medications ?Prior to Admission medications   ?Medication Sig Start Date End Date Taking? Authorizing Provider  ?aspirin EC 325 MG tablet Take 1 tablet (325 mg total) by mouth daily. 07/01/19   Zadie Rhine, MD  ?cyclobenzaprine (FLEXERIL) 10 MG tablet Take 1 tablet (10 mg total) by mouth 2 (two) times daily as needed for muscle spasms. 08/04/19   Fawze, Mina A, PA-C  ?ibuprofen (ADVIL) 800 MG tablet Take 1 tablet (800 mg total) by mouth 3 (three) times daily. 05/28/19   Roxy Horseman, PA-C  ?naproxen (NAPROSYN) 500 MG tablet Take 1 tablet (500 mg total) by mouth 2 (two) times daily with a meal. 08/04/19   Fawze, Mina A, PA-C  ?nitroGLYCERIN (NITROSTAT) 0.4 MG SL tablet Place 1 tablet (0.4 mg total) under the tongue every 5 (five) minutes as needed for chest pain. 07/01/19  Ripley Fraise, MD  ?predniSONE (STERAPRED UNI-PAK 21 TAB) 10 MG (21) TBPK tablet Use as directed 11/23/19   Sharion Balloon, FNP  ?   ? ?Allergies    ?Gabapentin   ? ?Review of Systems   ?Review of Systems  ?Constitutional:  Positive for fatigue. Negative for chills and fever.  ?HENT:  Negative for facial swelling and trouble swallowing.   ?Eyes:  Negative for photophobia and visual disturbance.  ?Respiratory:  Negative for cough and shortness of breath.   ?Cardiovascular:  Negative for chest pain and palpitations.  ?Gastrointestinal:  Positive for abdominal pain, diarrhea and nausea. Negative for vomiting.  ?Endocrine: Negative for polydipsia and polyuria.  ?Genitourinary:  Negative for difficulty urinating and hematuria.  ?Musculoskeletal:  Negative for gait problem and joint swelling.  ?Skin:  Negative for pallor and rash.  ?Neurological:  Negative for syncope and  headaches.  ?Psychiatric/Behavioral:  Negative for agitation and confusion.   ? ?Physical Exam ?Updated Vital Signs ?BP 135/86 (BP Location: Left Arm)   Pulse 87   Temp 99.3 ?F (37.4 ?C) (Oral)   Resp 20   SpO2 99%  ?Physical Exam ?Vitals and nursing note reviewed.  ?Constitutional:   ?   General: He is not in acute distress. ?   Appearance: Normal appearance. He is well-developed. He is not toxic-appearing or diaphoretic.  ?HENT:  ?   Head: Normocephalic and atraumatic.  ?   Right Ear: External ear normal.  ?   Left Ear: External ear normal.  ?   Mouth/Throat:  ?   Mouth: Mucous membranes are moist.  ?Eyes:  ?   General: No scleral icterus. ?Cardiovascular:  ?   Rate and Rhythm: Normal rate and regular rhythm.  ?   Pulses: Normal pulses.  ?   Heart sounds: Normal heart sounds.  ?Pulmonary:  ?   Effort: Pulmonary effort is normal. No respiratory distress.  ?   Breath sounds: Normal breath sounds.  ?Abdominal:  ?   General: Abdomen is flat.  ?   Palpations: Abdomen is soft.  ?   Tenderness: There is no abdominal tenderness. There is no guarding or rebound.  ?Musculoskeletal:     ?   General: Normal range of motion.  ?   Cervical back: Normal range of motion.  ?   Right lower leg: No edema.  ?   Left lower leg: No edema.  ?Skin: ?   General: Skin is warm and dry.  ?   Capillary Refill: Capillary refill takes less than 2 seconds.  ?Neurological:  ?   Mental Status: He is alert and oriented to person, place, and time.  ?Psychiatric:     ?   Mood and Affect: Mood normal.     ?   Behavior: Behavior normal.  ? ? ?ED Results / Procedures / Treatments   ?Labs ?(all labs ordered are listed, but only abnormal results are displayed) ?Labs Reviewed  ?COMPREHENSIVE METABOLIC PANEL - Abnormal; Notable for the following components:  ?    Result Value  ? Sodium 134 (*)   ? CO2 21 (*)   ? Glucose, Bld 120 (*)   ? Calcium 8.5 (*)   ? All other components within normal limits  ?RESP PANEL BY RT-PCR (FLU A&B, COVID) ARPGX2  ?C  DIFFICILE QUICK SCREEN W PCR REFLEX    ?GASTROINTESTINAL PANEL BY PCR, STOOL (REPLACES STOOL CULTURE)  ?CBC  ?LIPASE, BLOOD  ? ? ?EKG ?None ? ?Radiology ?No results found. ? ?Procedures ?Marland KitchenCritical  Care ?Performed by: Jeanell Sparrow, DO ?Authorized by: Jeanell Sparrow, DO  ? ?Critical care provider statement:  ?  Critical care time (minutes):  30 ?  Critical care time was exclusive of:  Separately billable procedures and treating other patients ?  Critical care was necessary to treat or prevent imminent or life-threatening deterioration of the following conditions:  Dehydration ?  Critical care was time spent personally by me on the following activities:  Development of treatment plan with patient or surrogate, discussions with consultants, evaluation of patient's response to treatment, examination of patient, ordering and review of laboratory studies, ordering and review of radiographic studies, ordering and performing treatments and interventions, pulse oximetry, re-evaluation of patient's condition, review of old charts and obtaining history from patient or surrogate  ? ? ?Medications Ordered in ED ?Medications  ?lactated ringers bolus 1,000 mL (0 mLs Intravenous Stopped 07/07/21 1007)  ?alum & mag hydroxide-simeth (MAALOX/MYLANTA) 200-200-20 MG/5ML suspension 30 mL (30 mLs Oral Given 07/07/21 1102)  ?  And  ?lidocaine (XYLOCAINE) 2 % viscous mouth solution 15 mL (15 mLs Oral Given 07/07/21 1103)  ?dicyclomine (BENTYL) capsule 10 mg (10 mg Oral Given 07/07/21 1103)  ?famotidine (PEPCID) IVPB 20 mg premix (20 mg Intravenous New Bag/Given 07/07/21 1108)  ? ? ?ED Course/ Medical Decision Making/ A&P ?  ?                        ?Medical Decision Making ?Amount and/or Complexity of Data Reviewed ?Labs: ordered. ? ?Risk ?OTC drugs. ?Prescription drug management. ? ? ?Initial Impression and Ddx ?This patient presents to the Emergency Department for the above complaint. This involves an extensive number of treatment options and  is a complaint that carries with it a high risk of complications and morbidity. Vital signs were reviewed.  ? ?Serious etiologies considered. Ddx includes but is not limited to: Enteritis, foodborne illness, viral G

## 2021-07-12 LAB — GASTROINTESTINAL PANEL BY PCR, STOOL (REPLACES STOOL CULTURE)

## 2021-10-09 ENCOUNTER — Ambulatory Visit
Admission: EM | Admit: 2021-10-09 | Discharge: 2021-10-09 | Disposition: A | Discharge status: Home / Self Care | Payer: BC Managed Care – PPO | Attending: Internal Medicine | Admitting: Internal Medicine

## 2021-10-09 DIAGNOSIS — T63461A Toxic effect of venom of wasps, accidental (unintentional), initial encounter: Secondary | ICD-10-CM | POA: Diagnosis not present

## 2021-10-09 MED ORDER — DEXAMETHASONE SODIUM PHOSPHATE 10 MG/ML IJ SOLN
10.0000 mg | Freq: Once | INTRAMUSCULAR | Status: AC
  Administered 2021-10-09: 10 mg via INTRAMUSCULAR

## 2021-10-09 MED ORDER — METHYLPREDNISOLONE SODIUM SUCC 125 MG IJ SOLR: 80.0000 mg | Freq: Once | INTRAMUSCULAR | Status: DC

## 2021-10-09 MED ORDER — CETIRIZINE HCL 10 MG PO TABS: 10.0000 mg | ORAL_TABLET | Freq: Every day | ORAL | 0 refills | Status: AC

## 2021-10-09 NOTE — ED Triage Notes (Signed)
 Patient presents to Urgent Care with complaints of yellow jacket sting on L forearm since yesterday. Patient reports he was mowing the grass when he was stung. Pt reports using menthol cream, benadryl  cream and chloro tabs for symptoms but itching and burning continues at site of sting.Zachary Grant

## 2022-04-10 ENCOUNTER — Ambulatory Visit
Admission: RE | Admit: 2022-04-10 | Discharge: 2022-04-10 | Disposition: A | Discharge status: Home / Self Care | Payer: BC Managed Care – PPO | Source: Ambulatory Visit | Attending: Family Medicine | Admitting: Family Medicine

## 2022-04-10 VITALS — BP 114/78 | HR 76 | Temp 97.9°F | Resp 16

## 2022-04-10 DIAGNOSIS — U071 COVID-19: Secondary | ICD-10-CM

## 2022-04-10 NOTE — ED Provider Notes (Signed)
[facility]   [id number] 04/10/22 Arrival Time: 1041  ASSESSMENT & PLAN:  1. COVID-19 virus infection    Feeling better. Return to work note provided.  May f/u as needed.  Reviewed expectations re: course of current medical issues. Questions answered. Outlined signs and symptoms indicating need for more acute intervention. Understanding verbalized. After Visit Summary given.   SUBJECTIVE: History from: Patient. Aasir Daigler is a 46 y.o. male. Reports: + home COVID test yesterday; feeling sick x 5 days; now feeling better. Needs return to work note. No resp problems/SOB/CP. Denies fever.  OBJECTIVE:  Vitals:   04/10/22 1125  BP: 114/78  Pulse: 76  Resp: 16  Temp: 97.9 F (36.6 C)  SpO2: 98%    General appearance: alert; no distress Eyes: PERRLA; EOMI; conjunctiva normal HENT: [place]; AT; without nasal congestion Neck: supple  Lungs: speaks full sentences without difficulty; unlabored Extremities: no edema Skin: warm and dry Neurologic: normal gait Psychological: alert and cooperative; normal mood and affect    Allergies  Allergen Reactions   Gabapentin Other (See Comments)    Irritability    Past Medical History:  Diagnosis Date   Bilateral headaches    Being worked up by neurology - 01/2014   Headache disorder 02/10/2014   Hearing loss of right ear    Being worked up by ENT 01/2014   Mild asthma    OSA on CPAP    per study  05-04-2013 mild osa   Traumatic rupture of left Achilles tendon    Wears contact lenses    Social History   Socioeconomic History   Marital status: Married    Spouse name: Ashlee   Number of children: 2   Years of education: 12+   Highest education level: Not on file  Occupational History   Occupation: Materials engineer: [employer]    Comment: [employer]  Tobacco Use   Smoking status: Former    Packs/day: 1.00    Years: 7.00    Total pack years: 7.00    Types:  Cigarettes    Quit date: 06/03/2008    Years since quitting: 13.8   Smokeless tobacco: Never  Vaping Use   Vaping Use: Never used  Substance and Sexual Activity   Alcohol use: Yes    Comment: Occasionally    Drug use: No   Sexual activity: Not on file  Other Topics Concern   Not on file  Social History Narrative   Patient lives wife and her grandparents.   Patient is right handed   Patient drinks about 2 cups of caffeine daily.   His two children from a previous relationship live with their mother.      Maternal grandfather rheumatoid arthritis, Maternal grandfather had neurosarcoid.   Social Determinants of Health   Financial Resource Strain: Not on file  Food Insecurity: Not on file  Transportation Needs: Not on file  Physical Activity: Not on file  Stress: Not on file  Social Connections: Not on file  Intimate Partner Violence: Not on file   Family History  Problem Relation Age of Onset   Asthma Mother    Depression Brother    Past Surgical History:  Procedure Laterality Date   ACHILLES TENDON REPAIR Left 2014   and removal spur   ACHILLES TENDON SURGERY Left 09/04/2013   Procedure: LEFT ACHILLES TENDON REPAIR ;  Surgeon: Ludger Nutting, MD;  Location: [facility];  Service: Podiatry;  Laterality: Left;  CARDIAC CATHETERIZATION N/A 04/27/2016   Procedure: Left Heart Cath and Coronary Angiography;  Surgeon: Adrian Prows, MD;  Location: [place] [facility];  Service: Cardiovascular;  Laterality: N/A;   CARDIOVASCULAR STRESS TEST  05-25-2011   DR Einar Gip   NORMAL PERFUSION/  NORMAL EF   TONSILLECTOMY  age 26     Vanessa Kick, MD 04/10/22 1326

## 2022-04-10 NOTE — ED Triage Notes (Signed)
[facility] with need for covid retesting. Pt reports he has been sick since last Friday. Pt reports he needs it to go back to work. Pthas been using otc medications

## 2022-07-30 ENCOUNTER — Encounter: Payer: Self-pay | Admitting: *Deleted
